# Patient Record
Sex: Male | Born: 1945 | Race: White | Hispanic: No | Marital: Married | Smoking: Former smoker
Health system: Southern US, Community
[De-identification: ages and names within clinical notes are randomized; demographics above are authoritative.]

## PROBLEM LIST (undated history)

## (undated) DIAGNOSIS — I10 Essential (primary) hypertension: Secondary | ICD-10-CM

## (undated) DIAGNOSIS — N400 Enlarged prostate without lower urinary tract symptoms: Secondary | ICD-10-CM

## (undated) DIAGNOSIS — E785 Hyperlipidemia, unspecified: Secondary | ICD-10-CM

## (undated) DIAGNOSIS — E7800 Pure hypercholesterolemia, unspecified: Secondary | ICD-10-CM

## (undated) DIAGNOSIS — K219 Gastro-esophageal reflux disease without esophagitis: Secondary | ICD-10-CM

## (undated) DIAGNOSIS — F419 Anxiety disorder, unspecified: Secondary | ICD-10-CM

## (undated) DIAGNOSIS — H269 Unspecified cataract: Secondary | ICD-10-CM

## (undated) DIAGNOSIS — I2510 Atherosclerotic heart disease of native coronary artery without angina pectoris: Secondary | ICD-10-CM

## (undated) HISTORY — PX: CATARACT EXTRACTION, BILATERAL: SHX1313

## (undated) HISTORY — DX: Gastro-esophageal reflux disease without esophagitis: K21.9

## (undated) HISTORY — DX: Pure hypercholesterolemia, unspecified: E78.00

## (undated) HISTORY — DX: Benign prostatic hyperplasia without lower urinary tract symptoms: N40.0

## (undated) HISTORY — PX: COLONOSCOPY: SHX174

## (undated) HISTORY — DX: Atherosclerotic heart disease of native coronary artery without angina pectoris: I25.10

## (undated) HISTORY — PX: TONSILLECTOMY: SUR1361

## (undated) HISTORY — DX: Unspecified cataract: H26.9

## (undated) HISTORY — DX: Anxiety disorder, unspecified: F41.9

## (undated) HISTORY — DX: Hyperlipidemia, unspecified: E78.5

## (undated) HISTORY — PX: APPENDECTOMY: SHX54

## (undated) HISTORY — DX: Essential (primary) hypertension: I10

---

## 2010-09-26 ENCOUNTER — Ambulatory Visit: Payer: Self-pay | Admitting: Surgery

## 2010-09-30 ENCOUNTER — Ambulatory Visit: Payer: Self-pay | Admitting: Surgery

## 2011-10-06 ENCOUNTER — Emergency Department: Payer: Self-pay | Admitting: *Deleted

## 2011-10-06 LAB — URINALYSIS, COMPLETE
Bacteria: NONE SEEN
Bilirubin,UR: NEGATIVE
Blood: NEGATIVE
Glucose,UR: NEGATIVE mg/dL (ref 0–75)
Nitrite: NEGATIVE
WBC UR: 9 /HPF (ref 0–5)

## 2011-10-06 LAB — CBC
HCT: 49.6 % (ref 40.0–52.0)
MCH: 30.8 pg (ref 26.0–34.0)
MCHC: 32.8 g/dL (ref 32.0–36.0)
MCV: 94 fL (ref 80–100)
RBC: 5.28 10*6/uL (ref 4.40–5.90)
RDW: 14.1 % (ref 11.5–14.5)
WBC: 12.8 10*3/uL — ABNORMAL HIGH (ref 3.8–10.6)

## 2011-10-06 LAB — BASIC METABOLIC PANEL
Anion Gap: 9 (ref 7–16)
BUN: 18 mg/dL (ref 7–18)
Co2: 24 mmol/L (ref 21–32)
EGFR (African American): >60
Potassium: 4.6 mmol/L (ref 3.5–5.1)

## 2011-10-27 ENCOUNTER — Ambulatory Visit: Payer: Self-pay | Admitting: Urology

## 2011-11-10 ENCOUNTER — Ambulatory Visit: Payer: Self-pay | Admitting: Urology

## 2012-04-26 ENCOUNTER — Emergency Department: Payer: Self-pay | Admitting: Emergency Medicine

## 2012-04-26 LAB — URINALYSIS, COMPLETE
Ph: 5 (ref 4.5–8.0)
Protein: 30
RBC,UR: 203 /HPF (ref 0–5)
Squamous Epithelial: <1
WBC UR: 16 /HPF (ref 0–5)

## 2012-04-26 LAB — BASIC METABOLIC PANEL
Calcium, Total: 8.4 mg/dL — ABNORMAL LOW (ref 8.5–10.1)
Chloride: 108 mmol/L — ABNORMAL HIGH (ref 98–107)
Creatinine: 1.18 mg/dL (ref 0.60–1.30)
EGFR (Non-African Amer.): >60
Glucose: 107 mg/dL — ABNORMAL HIGH (ref 65–99)
Potassium: 4.5 mmol/L (ref 3.5–5.1)
Sodium: 139 mmol/L (ref 136–145)

## 2012-04-26 LAB — CBC
HCT: 45.1 % (ref 40.0–52.0)
MCHC: 35.6 g/dL (ref 32.0–36.0)
MCV: 93 fL (ref 80–100)
RDW: 14 % (ref 11.5–14.5)

## 2013-09-18 DIAGNOSIS — N400 Enlarged prostate without lower urinary tract symptoms: Secondary | ICD-10-CM | POA: Insufficient documentation

## 2013-09-18 DIAGNOSIS — K219 Gastro-esophageal reflux disease without esophagitis: Secondary | ICD-10-CM | POA: Insufficient documentation

## 2013-09-18 DIAGNOSIS — R251 Tremor, unspecified: Secondary | ICD-10-CM | POA: Insufficient documentation

## 2013-09-18 DIAGNOSIS — E785 Hyperlipidemia, unspecified: Secondary | ICD-10-CM | POA: Insufficient documentation

## 2017-05-16 ENCOUNTER — Other Ambulatory Visit: Payer: Self-pay | Admitting: Gastroenterology

## 2017-05-16 DIAGNOSIS — R1031 Right lower quadrant pain: Secondary | ICD-10-CM

## 2017-05-24 ENCOUNTER — Ambulatory Visit
Admission: RE | Admit: 2017-05-24 | Discharge: 2017-05-24 | Disposition: A | Discharge status: Home / Self Care | Payer: Medicare PPO | Source: Ambulatory Visit | Attending: Gastroenterology | Admitting: Gastroenterology

## 2017-05-24 ENCOUNTER — Ambulatory Visit: Admission: RE | Admit: 2017-05-24 | Payer: Self-pay | Source: Ambulatory Visit

## 2017-05-24 DIAGNOSIS — R918 Other nonspecific abnormal finding of lung field: Secondary | ICD-10-CM | POA: Insufficient documentation

## 2017-05-24 DIAGNOSIS — K4090 Unilateral inguinal hernia, without obstruction or gangrene, not specified as recurrent: Secondary | ICD-10-CM | POA: Diagnosis not present

## 2017-05-24 DIAGNOSIS — R1031 Right lower quadrant pain: Secondary | ICD-10-CM | POA: Diagnosis present

## 2017-05-24 DIAGNOSIS — K5730 Diverticulosis of large intestine without perforation or abscess without bleeding: Secondary | ICD-10-CM | POA: Diagnosis not present

## 2017-05-24 DIAGNOSIS — R911 Solitary pulmonary nodule: Secondary | ICD-10-CM | POA: Insufficient documentation

## 2017-05-24 MED ORDER — IOPAMIDOL (ISOVUE-300) INJECTION 61%
100.0000 mL | Freq: Once | INTRAVENOUS | Status: AC | PRN
  Administered 2017-05-24: 100 mL via INTRAVENOUS

## 2017-07-13 ENCOUNTER — Ambulatory Visit: Admission: RE | Admit: 2017-07-13 | Payer: Medicare PPO | Source: Ambulatory Visit | Admitting: Gastroenterology

## 2017-07-13 ENCOUNTER — Encounter: Admission: RE | Payer: Self-pay | Source: Ambulatory Visit

## 2017-07-13 SURGERY — ESOPHAGOGASTRODUODENOSCOPY (EGD) WITH PROPOFOL
Anesthesia: General

## 2017-10-01 ENCOUNTER — Other Ambulatory Visit: Payer: Self-pay | Admitting: Family Medicine

## 2017-10-01 DIAGNOSIS — R911 Solitary pulmonary nodule: Secondary | ICD-10-CM

## 2017-10-05 ENCOUNTER — Ambulatory Visit: Payer: Medicare PPO

## 2017-10-10 ENCOUNTER — Ambulatory Visit
Admission: RE | Admit: 2017-10-10 | Discharge: 2017-10-10 | Disposition: A | Discharge status: Home / Self Care | Payer: Medicare PPO | Source: Ambulatory Visit | Attending: Family Medicine | Admitting: Family Medicine

## 2017-10-10 ENCOUNTER — Ambulatory Visit: Payer: Medicare PPO

## 2017-10-10 DIAGNOSIS — I700 Atherosclerosis of aorta: Secondary | ICD-10-CM | POA: Diagnosis not present

## 2017-10-10 DIAGNOSIS — R911 Solitary pulmonary nodule: Secondary | ICD-10-CM | POA: Insufficient documentation

## 2017-10-10 DIAGNOSIS — I2510 Atherosclerotic heart disease of native coronary artery without angina pectoris: Secondary | ICD-10-CM | POA: Insufficient documentation

## 2017-10-10 DIAGNOSIS — Q859 Phakomatosis, unspecified: Secondary | ICD-10-CM | POA: Diagnosis not present

## 2017-10-10 DIAGNOSIS — R918 Other nonspecific abnormal finding of lung field: Secondary | ICD-10-CM | POA: Diagnosis not present

## 2017-11-05 DIAGNOSIS — N183 Chronic kidney disease, stage 3 unspecified: Secondary | ICD-10-CM | POA: Insufficient documentation

## 2017-11-09 ENCOUNTER — Other Ambulatory Visit: Payer: Self-pay | Admitting: Nephrology

## 2017-11-09 DIAGNOSIS — N183 Chronic kidney disease, stage 3 unspecified: Secondary | ICD-10-CM

## 2017-11-09 DIAGNOSIS — N179 Acute kidney failure, unspecified: Secondary | ICD-10-CM

## 2017-11-20 ENCOUNTER — Ambulatory Visit
Admission: RE | Admit: 2017-11-20 | Discharge: 2017-11-20 | Disposition: A | Discharge status: Home / Self Care | Payer: Medicare PPO | Source: Ambulatory Visit | Attending: Nephrology | Admitting: Nephrology

## 2017-11-20 DIAGNOSIS — N179 Acute kidney failure, unspecified: Secondary | ICD-10-CM | POA: Insufficient documentation

## 2017-11-20 DIAGNOSIS — N183 Chronic kidney disease, stage 3 unspecified: Secondary | ICD-10-CM

## 2017-12-20 DIAGNOSIS — R768 Other specified abnormal immunological findings in serum: Secondary | ICD-10-CM | POA: Insufficient documentation

## 2018-01-07 ENCOUNTER — Inpatient Hospital Stay: Payer: Medicare PPO

## 2018-01-07 ENCOUNTER — Ambulatory Visit
Admission: RE | Admit: 2018-01-07 | Discharge: 2018-01-07 | Disposition: A | Discharge status: Home / Self Care | Payer: Medicare PPO | Source: Ambulatory Visit | Attending: Oncology | Admitting: Oncology

## 2018-01-07 ENCOUNTER — Encounter: Payer: Self-pay | Admitting: Oncology

## 2018-01-07 ENCOUNTER — Inpatient Hospital Stay: Payer: Medicare PPO | Attending: Oncology | Admitting: Oncology

## 2018-01-07 VITALS — BP 114/61 | HR 68 | Temp 98.0°F | Resp 18 | Wt 212.2 lb

## 2018-01-07 DIAGNOSIS — R5383 Other fatigue: Secondary | ICD-10-CM | POA: Insufficient documentation

## 2018-01-07 DIAGNOSIS — E785 Hyperlipidemia, unspecified: Secondary | ICD-10-CM

## 2018-01-07 DIAGNOSIS — I129 Hypertensive chronic kidney disease with stage 1 through stage 4 chronic kidney disease, or unspecified chronic kidney disease: Secondary | ICD-10-CM | POA: Insufficient documentation

## 2018-01-07 DIAGNOSIS — F419 Anxiety disorder, unspecified: Secondary | ICD-10-CM | POA: Insufficient documentation

## 2018-01-07 DIAGNOSIS — N400 Enlarged prostate without lower urinary tract symptoms: Secondary | ICD-10-CM | POA: Insufficient documentation

## 2018-01-07 DIAGNOSIS — Z79899 Other long term (current) drug therapy: Secondary | ICD-10-CM | POA: Diagnosis not present

## 2018-01-07 DIAGNOSIS — R778 Other specified abnormalities of plasma proteins: Secondary | ICD-10-CM | POA: Insufficient documentation

## 2018-01-07 DIAGNOSIS — N183 Chronic kidney disease, stage 3 (moderate): Secondary | ICD-10-CM | POA: Diagnosis not present

## 2018-01-07 DIAGNOSIS — K219 Gastro-esophageal reflux disease without esophagitis: Secondary | ICD-10-CM | POA: Diagnosis not present

## 2018-01-07 DIAGNOSIS — R0609 Other forms of dyspnea: Secondary | ICD-10-CM

## 2018-01-07 LAB — CBC WITH DIFFERENTIAL/PLATELET
Basophils Absolute: 0 10*3/uL (ref 0–0.1)
Basophils Relative: 0 %
Eosinophils Absolute: 0.2 10*3/uL (ref 0–0.7)
Eosinophils Relative: 2 %
HEMATOCRIT: 42.4 % (ref 40.0–52.0)
HEMOGLOBIN: 14.5 g/dL (ref 13.0–18.0)
LYMPHS ABS: 1.2 10*3/uL (ref 1.0–3.6)
Lymphocytes Relative: 18 %
MCH: 31.9 pg (ref 26.0–34.0)
MCHC: 34.1 g/dL (ref 32.0–36.0)
MCV: 93.6 fL (ref 80.0–100.0)
MONO ABS: 0.7 10*3/uL (ref 0.2–1.0)
MONOS PCT: 10 %
NEUTROS ABS: 4.7 10*3/uL (ref 1.4–6.5)
NEUTROS PCT: 70 %
Platelets: 196 10*3/uL (ref 150–440)
RBC: 4.53 MIL/uL (ref 4.40–5.90)
RDW: 13 % (ref 11.5–14.5)
WBC: 6.8 10*3/uL (ref 3.8–10.6)

## 2018-01-07 LAB — COMPREHENSIVE METABOLIC PANEL
ALBUMIN: 4.1 g/dL (ref 3.5–5.0)
ALK PHOS: 67 U/L (ref 38–126)
ALT: 21 U/L (ref 0–44)
ANION GAP: 9 (ref 5–15)
AST: 21 U/L (ref 15–41)
BUN: 25 mg/dL — ABNORMAL HIGH (ref 8–23)
CALCIUM: 8.9 mg/dL (ref 8.9–10.3)
CHLORIDE: 101 mmol/L (ref 98–111)
CO2: 25 mmol/L (ref 22–32)
Creatinine, Ser: 1.56 mg/dL — ABNORMAL HIGH (ref 0.61–1.24)
GFR calc Af Amer: 49 mL/min — ABNORMAL LOW (ref >60)
GFR calc non Af Amer: 42 mL/min — ABNORMAL LOW (ref >60)
GLUCOSE: 93 mg/dL (ref 70–99)
Potassium: 4.5 mmol/L (ref 3.5–5.1)
Sodium: 135 mmol/L (ref 135–145)
Total Bilirubin: 0.6 mg/dL (ref 0.3–1.2)
Total Protein: 8 g/dL (ref 6.5–8.1)

## 2018-01-07 LAB — IRON AND TIBC
Iron: 63 ug/dL (ref 45–182)
Saturation Ratios: 23 % (ref 17.9–39.5)
TIBC: 274 ug/dL (ref 250–450)
UIBC: 211 ug/dL

## 2018-01-07 LAB — FOLATE: FOLATE: 28 ng/mL (ref >5.9)

## 2018-01-07 LAB — FERRITIN: Ferritin: 159 ng/mL (ref 24–336)

## 2018-01-07 NOTE — Progress Notes (Signed)
Hematology/Oncology Consult note [facility] Telephone:([phone] Fax:([phone]  Patient Care Team: Sofie Hartigan, MD as PCP - General (Family Medicine)   Name of the patient: Steve Warren  [id number]  10/04/1945    Reason for referral- abnormal spep   Referring physician- Dr. Holley Raring  Date of visit: 01/07/18   History of presenting illness-patient is 72 year old male who was recently seen by rheumatology for positive ANA.  He has a history of stage III chronic kidney disease, hypertension, hyperlipidemia and GERD.  He has been referred to [place] for abnormal SPEP.  His most recent labs from 11/07/2017 showed white count of 8.3, H&H of 12.8/38 with an MCV of 93.4 and a platelet count of 224.  LFTs were within normal limits.  Serum calcium was 9.3.  Serum creatinine was elevated at 1.58.  As a part of work-up for chronic kidney disease patient had an SPEP done which showed abnormal protein in the gammaglobulin and a possible second abnormal protein which may represent monoclonal immunoglobulins.  Recent CBC from 12/20/2017 showed H&H of 14.5/44.5.  Patient reports mild fatigue and feels short of breath when he climbs 1 flight of stairs.  His appetite is good and denies any unintentional weight loss.  Denies any new aches or pains anywhere.  No recurrent infections.  ECOG PS- 0  Pain scale- 0   Review of systems- Review of Systems  Constitutional: Negative for chills, fever, malaise/fatigue and weight loss.  HENT: Negative for congestion, ear discharge and nosebleeds.   Eyes: Negative for blurred vision.  Respiratory: Negative for cough, hemoptysis, sputum production, shortness of breath and wheezing.   Cardiovascular: Negative for chest pain, palpitations, orthopnea and claudication.  Gastrointestinal: Negative for abdominal pain, blood in stool, constipation, diarrhea, heartburn, melena, nausea and vomiting.  Genitourinary: Negative for dysuria, flank  pain, frequency, hematuria and urgency.  Musculoskeletal: Negative for back pain, joint pain and myalgias.  Skin: Negative for rash.  Neurological: Negative for dizziness, tingling, focal weakness, seizures, weakness and headaches.  Endo/Heme/Allergies: Does not bruise/bleed easily.  Psychiatric/Behavioral: Negative for depression and suicidal ideas. The patient does not have insomnia.     Not on File  There are no active problems to display for this patient.    Past Medical History:  Diagnosis Date  . Anxiety   . BPH (benign prostatic hyperplasia)   . GERD (gastroesophageal reflux disease)   . Hypercholesterolemia   . Hyperlipidemia   . Hypertension      Past Surgical History:  Procedure Laterality Date  . APPENDECTOMY    . COLONOSCOPY    . TONSILLECTOMY      Social History   Socioeconomic History  . Marital status: Married    Spouse name: Not on file  . Number of children: Not on file  . Years of education: Not on file  . Highest education level: Not on file  Occupational History  . Not on file  Social Needs  . Financial resource strain: Not on file  . Food insecurity:    Worry: Not on file    Inability: Not on file  . Transportation needs:    Medical: Not on file    Non-medical: Not on file  Tobacco Use  . Smoking status: Never Smoker  . Smokeless tobacco: Never Used  Substance and Sexual Activity  . Alcohol use: Not on file  . Drug use: Not on file  . Sexual activity: Not on file  Lifestyle  . Physical activity:  Days per week: Not on file    Minutes per session: Not on file  . Stress: Not on file  Relationships  . Social connections:    Talks on phone: Not on file    Gets together: Not on file    Attends religious service: Not on file    Active member of club or organization: Not on file    Attends meetings of clubs or organizations: Not on file    Relationship status: Not on file  . Intimate partner violence:    Fear of current or ex  partner: Not on file    Emotionally abused: Not on file    Physically abused: Not on file    Forced sexual activity: Not on file  Other Topics Concern  . Not on file  Social History Narrative  . Not on file     Family History  Problem Relation Age of Onset  . Alzheimer's disease Father   . Alzheimer's disease Maternal Grandmother      Current Outpatient Medications:  .  cholecalciferol (VITAMIN D) 1000 units tablet, Take 1,000 Units by mouth daily., Disp: , Rfl:  .  omeprazole (PRILOSEC) 20 MG capsule, Take 20 mg by mouth 2 (two) times daily before a meal., Disp: , Rfl:  .  primidone (MYSOLINE) 50 MG tablet, TAKE 1 TABLET(50 MG) BY MOUTH EVERY NIGHT, Disp: , Rfl:  .  simvastatin (ZOCOR) 20 MG tablet, TAKE 1 TABLET(20 MG) BY MOUTH EVERY NIGHT, Disp: , Rfl:  .  tamsulosin (FLOMAX) 0.4 MG CAPS capsule, TAKE 1 CAPSULE BY MOUTH DAILY. TAKE 30 MINUTES AFTER SAME MEAL EACH DAY, Disp: , Rfl:  .  vitamin C (ASCORBIC ACID) 500 MG tablet, Take 500 mg by mouth daily., Disp: , Rfl:    Physical exam:  Vitals:   01/07/18 1348  BP: 114/61  Pulse: 68  Resp: 18  Temp: 98 F (36.7 C)  TempSrc: Tympanic  SpO2: 100%  Weight: 212 lb 3.1 oz (96.2 kg)   Physical Exam  Constitutional: He is oriented to person, place, and time.  HENT:  Head: Normocephalic and atraumatic.  Eyes: Pupils are equal, round, and reactive to light. EOM are normal.  Neck: Normal range of motion.  Cardiovascular: Normal rate, regular rhythm and normal heart sounds.  Pulmonary/Chest: Effort normal and breath sounds normal.  Abdominal: Soft. Bowel sounds are normal.  Neurological: He is alert and oriented to person, place, and time.  Skin: Skin is warm and dry.       CMP Latest Ref Rng & Units 01/07/2018  Glucose 70 - 99 mg/dL 93  BUN 8 - 23 mg/dL 25(H)  Creatinine 0.61 - 1.24 mg/dL 1.56(H)  Sodium 135 - 145 mmol/L 135  Potassium 3.5 - 5.1 mmol/L 4.5  Chloride 98 - 111 mmol/L 101  CO2 22 - 32 mmol/L 25    Calcium 8.9 - 10.3 mg/dL 8.9  Total Protein 6.5 - 8.1 g/dL 8.0  Total Bilirubin 0.3 - 1.2 mg/dL 0.6  Alkaline Phos 38 - 126 U/L 67  AST 15 - 41 U/L 21  ALT 0 - 44 U/L 21   CBC Latest Ref Rng & Units 01/07/2018  WBC 3.8 - 10.6 K/uL 6.8  Hemoglobin 13.0 - 18.0 g/dL 14.5  Hematocrit 40.0 - 52.0 % 42.4  Platelets 150 - 440 K/uL 196     Assessment and plan- Patient is a 72 y.o. male referred for abnormal SPEP  I have reviewed outside records from Dr. Elwyn Lade office and  his most recent creatinine was down to 1.8 from 2.2 in May 2019.  He does not have any evidence of anemia, hypercalcemia.  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  I will see the patient back in 3 weeks time to discuss the results of the tests and further management.  I explained to the patient the natural history of MGUS and differences between MGUS, smoldering multiple myeloma and overt multiple myeloma.  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 Abnormal SPEP     Dr. Randa Evens, MD, MPH [facility] at [facility] [phone] 01/07/2018  3:51 PM

## 2018-01-08 ENCOUNTER — Telehealth: Payer: Self-pay

## 2018-01-08 LAB — MULTIPLE MYELOMA PANEL, SERUM
Albumin SerPl Elph-Mcnc: 3.7 g/dL (ref 2.9–4.4)
Albumin/Glob SerPl: 1.1 (ref 0.7–1.7)
Alpha 1: 0.3 g/dL (ref 0.0–0.4)
Alpha2 Glob SerPl Elph-Mcnc: 0.9 g/dL (ref 0.4–1.0)
B-GLOBULIN SERPL ELPH-MCNC: 0.9 g/dL (ref 0.7–1.3)
Gamma Glob SerPl Elph-Mcnc: 1.4 g/dL (ref 0.4–1.8)
Globulin, Total: 3.4 g/dL (ref 2.2–3.9)
IGA: 106 mg/dL (ref 61–437)
IgG (Immunoglobin G), Serum: 1634 mg/dL — ABNORMAL HIGH (ref 700–1600)
IgM (Immunoglobulin M), Srm: 34 mg/dL (ref 15–143)
M PROTEIN SERPL ELPH-MCNC: 0.6 g/dL — AB
TOTAL PROTEIN ELP: 7.1 g/dL (ref 6.0–8.5)

## 2018-01-08 LAB — KAPPA/LAMBDA LIGHT CHAINS
Kappa free light chain: 92.2 mg/L — ABNORMAL HIGH (ref 3.3–19.4)
Kappa, lambda light chain ratio: 5.95 — ABNORMAL HIGH (ref 0.26–1.65)
LAMDA FREE LIGHT CHAINS: 15.5 mg/L (ref 5.7–26.3)

## 2018-01-08 LAB — VITAMIN B12: VITAMIN B 12: 224 pg/mL (ref 180–914)

## 2018-01-08 NOTE — Telephone Encounter (Signed)
-----   Message from Sindy Guadeloupe, MD sent at 01/08/2018  7:55 AM EDT ----- Please let him know b12 levels are low. He should start taking oral b12 1000 mcg po daily. Thanks, Astrid Divine

## 2018-01-08 NOTE — Telephone Encounter (Signed)
Spoke with the patient to inform him that his B-12 levels are running low. I have inform the patient that he will need to start a B-12 oral 1,000 mcg daily. The patient was understanding and agreeable to start to the B-12 as of today.

## 2018-01-09 ENCOUNTER — Other Ambulatory Visit: Admission: RE | Admit: 2018-01-09 | Payer: Medicare PPO | Source: Ambulatory Visit

## 2018-01-09 ENCOUNTER — Inpatient Hospital Stay: Payer: Medicare PPO

## 2018-01-09 DIAGNOSIS — R778 Other specified abnormalities of plasma proteins: Secondary | ICD-10-CM | POA: Diagnosis not present

## 2018-01-10 LAB — UPEP/TP, 24-HR URINE
ALBUMIN, U: 100 %
ALPHA 1 UR: 0 %
ALPHA 2, URINE: 0 %
BETA UR: 0 %
GAMMA UR: 0 %
TOTAL PROTEIN, URINE-UPE24: 4.3 mg/dL
Total Protein, Urine-Ur/day: 114 mg/24 hr (ref 30–150)
Total Volume: 2650

## 2018-01-24 ENCOUNTER — Encounter: Payer: Self-pay | Admitting: Nephrology

## 2018-01-28 ENCOUNTER — Telehealth: Payer: Self-pay | Admitting: *Deleted

## 2018-01-28 ENCOUNTER — Telehealth: Payer: Self-pay

## 2018-01-28 ENCOUNTER — Encounter: Payer: Self-pay | Admitting: Oncology

## 2018-01-28 ENCOUNTER — Inpatient Hospital Stay: Payer: Medicare PPO | Attending: Oncology | Admitting: Oncology

## 2018-01-28 VITALS — BP 95/52 | HR 80 | Temp 97.9°F | Resp 18 | Ht 71.0 in | Wt 214.9 lb

## 2018-01-28 DIAGNOSIS — D8989 Other specified disorders involving the immune mechanism, not elsewhere classified: Secondary | ICD-10-CM | POA: Insufficient documentation

## 2018-01-28 DIAGNOSIS — R778 Other specified abnormalities of plasma proteins: Secondary | ICD-10-CM | POA: Diagnosis not present

## 2018-01-28 NOTE — Progress Notes (Signed)
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  He has a history of stage III chronic kidney disease, hypertension, hyperlipidemia and GERD.  He has been referred to [place] for abnormal SPEP.  His most recent labs from 11/07/2017 showed white count of 8.3, H&H of 12.8/38 with an MCV of 93.4 and a platelet count of 224.  LFTs were within normal limits.  Serum calcium was 9.3.  Serum creatinine was elevated at 1.58.  As a part of work-up for chronic kidney disease patient had an SPEP done which showed abnormal protein in the gammaglobulin and a possible second abnormal protein which may represent monoclonal immunoglobulins.  Recent CBC from 12/20/2017 showed H&H of 14.5/44.5.   Results of blood work from 01/07/2018 were as follows: CBC showed white count of 6.8, H&H of 14.5/42.4 and a platelet count of 196.  CMP was significant for elevated creatinine of 1.56.  Multiple myeloma panel showed 0.6 g of IgG kappa light chain monoclonal protein.  Free kappa light chain was elevated at 92.2 and kappa lambda light chain ratio was abnormal at 5.95.  Iron studies and folate were normal.  B12 levels were low at 224  Interval history-patient feels well presently and denies any complaints today  ECOG PS- 1 Pain scale- 0   Review of systems- Review of Systems  Constitutional: Negative for chills, fever, malaise/fatigue and weight loss.  HENT: Negative for congestion, ear discharge and nosebleeds.   Eyes: Negative for blurred vision.  Respiratory: Negative for cough,  hemoptysis, sputum production, shortness of breath and wheezing.   Cardiovascular: Negative for chest pain, palpitations, orthopnea and claudication.  Gastrointestinal: Negative for abdominal pain, blood in stool, constipation, diarrhea, heartburn, melena, nausea and vomiting.  Genitourinary: Negative for dysuria, flank pain, frequency, hematuria and urgency.  Musculoskeletal: Negative for back pain, joint pain and myalgias.  Skin: Negative for rash.  Neurological: Negative for dizziness, tingling, focal weakness, seizures, weakness and headaches.  Endo/Heme/Allergies: Does not bruise/bleed easily.  Psychiatric/Behavioral: Negative for depression and suicidal ideas. The patient does not have insomnia.       Not on File   Past Medical History:  Diagnosis Date  . Anxiety   . BPH (benign prostatic hyperplasia)   . GERD (gastroesophageal reflux disease)   . Hypercholesterolemia   . Hyperlipidemia   . Hypertension      Past Surgical History:  Procedure Laterality Date  . APPENDECTOMY    . COLONOSCOPY    . TONSILLECTOMY      Social History   Socioeconomic History  . Marital status: Married    Spouse name: Not on file  . Number of children: Not on file  . Years of education: Not on file  . Highest education level: Not on file  Occupational History  . Not on file  Social Needs  . Financial resource strain: Not on file  . Food insecurity:    Worry: Not on file    Inability: Not on file  . Transportation needs:    Medical: Not on file    Non-medical: Not on file  Tobacco Use  .  Smoking status: Never Smoker  . Smokeless tobacco: Never Used  Substance and Sexual Activity  . Alcohol use: Not on file  . Drug use: Not on file  . Sexual activity: Not on file  Lifestyle  . Physical activity:    Days per week: Not on file    Minutes per session: Not on file  . Stress: Not on file  Relationships  . Social connections:    Talks on phone: Not on file    Gets together: Not  on file    Attends religious service: Not on file    Active member of club or organization: Not on file    Attends meetings of clubs or organizations: Not on file    Relationship status: Not on file  . Intimate partner violence:    Fear of current or ex partner: Not on file    Emotionally abused: Not on file    Physically abused: Not on file    Forced sexual activity: Not on file  Other Topics Concern  . Not on file  Social History Narrative  . Not on file    Family History  Problem Relation Age of Onset  . Alzheimer's disease Father   . Alzheimer's disease Maternal Grandmother      Current Outpatient Medications:  .  cholecalciferol (VITAMIN D) 1000 units tablet, Take 1,000 Units by mouth daily., Disp: , Rfl:  .  Cyanocobalamin (VITAMIN B 12 PO), Take 1,000 mcg by mouth daily., Disp: , Rfl:  .  primidone (MYSOLINE) 50 MG tablet, TAKE 1 TABLET(50 MG) BY MOUTH EVERY NIGHT, Disp: , Rfl:  .  simvastatin (ZOCOR) 20 MG tablet, TAKE 1 TABLET(20 MG) BY MOUTH EVERY NIGHT, Disp: , Rfl:  .  tamsulosin (FLOMAX) 0.4 MG CAPS capsule, TAKE 1 CAPSULE BY MOUTH DAILY. TAKE 30 MINUTES AFTER SAME MEAL EACH DAY, Disp: , Rfl:  .  omeprazole (PRILOSEC) 20 MG capsule, Take 20 mg by mouth 2 (two) times daily before a meal., Disp: , Rfl:  .  vitamin C (ASCORBIC ACID) 500 MG tablet, Take 500 mg by mouth daily., Disp: , Rfl:   Physical exam:  Vitals:   01/28/18 0939  BP: (!) 95/52  Pulse: 80  Resp: 18  Temp: 97.9 F (36.6 C)  TempSrc: Tympanic  SpO2: 100%  Weight: 214 lb 15.2 oz (97.5 kg)  Height: 5' 11" (1.803 m)   Physical Exam  Constitutional: He is oriented to person, place, and time. He appears well-developed and well-nourished.  HENT:  Head: Normocephalic and atraumatic.  Eyes: Pupils are equal, round, and reactive to light. EOM are normal.  Neck: Normal range of motion.  Cardiovascular: Normal rate, regular rhythm and normal heart sounds.  Pulmonary/Chest: Effort normal and breath  sounds normal.  Abdominal: Soft. Bowel sounds are normal.  Neurological: He is alert and oriented to person, place, and time.  Skin: Skin is warm and dry.     CMP Latest Ref Rng & Units 01/07/2018  Glucose 70 - 99 mg/dL 93  BUN 8 - 23 mg/dL 25(H)  Creatinine 0.61 - 1.24 mg/dL 1.56(H)  Sodium 135 - 145 mmol/L 135  Potassium 3.5 - 5.1 mmol/L 4.5  Chloride 98 - 111 mmol/L 101  CO2 22 - 32 mmol/L 25  Calcium 8.9 - 10.3 mg/dL 8.9  Total Protein 6.5 - 8.1 g/dL 8.0  Total Bilirubin 0.3 - 1.2 mg/dL 0.6  Alkaline Phos 38 - 126 U/L 67  AST 15 - 41 U/L 21  ALT 0 -   44 U/L 21   CBC Latest Ref Rng & Units 01/07/2018  WBC 3.8 - 10.6 K/uL 6.8  Hemoglobin 13.0 - 18.0 g/dL 14.5  Hematocrit 40.0 - 52.0 % 42.4  Platelets 150 - 440 K/uL 196    No images are attached to the encounter.  Dg Bone Survey Met  Result Date: 01/08/2018 CLINICAL DATA:  72-year-old male with a history of abnormal serum protein electrophoresis EXAM: METASTATIC BONE SURVEY COMPARISON:  None. FINDINGS: Skull: No lucent bone lesions. Cervical spine: Degenerative disc disease without malalignment.  No acute fracture. No lucent lesions. Chest: Cardiomediastinal silhouette within normal limits.  Lungs are clear. No acute displaced fracture or lucent lesions identified. Right upper extremity: No acute displaced fracture.  No lucent lesions. Left upper extremity: No acute displaced fracture.  No lucent lesions. Thoracic spine: Mild degenerative disc disease with endplate changes and anterior osteophyte production, most pronounced in the mid and lower levels. No lucent lesions. Lumbar spine: Degenerative disc disease worst at the L3-S1 levels, including advanced endplate changes and disc space narrowing at L4-L5 and L5-S1. Facet hypertrophy at L4-L5 and L5-S1. No lucent lesions are acute displaced fractures. Pelvis: Bony pelvic ring intact. Bilateral hips projects normally over the acetabula. No lucent lesions are displaced fracture. Pelvic  phleboliths. Right lower extremity: No acute displaced fracture or lucent lesions identified. Left lower extremity: No acute displaced fracture or lucent lesions identified. IMPRESSION: No evidence of metastatic disease on the bone survey. Electronically Signed   By: Jaime  Wagner D.O.   On: 01/08/2018 08:28     Assessment and plan- Patient is a 72 y.o. male referred for abnormal SPEP likely MGUS  I discussed the results of the blood work with the patient and his wife in detail.  Myeloma panel shows a small M protein of 0.6 g IgG kappa.  He does have some evidence of kidney disease with elevated creatinine of 1.5.  His hemoglobin is normal and he does not have any hypercalcemia.  No evidence of lytic lesion seen on bone survey.  However his free light chain ratio is significantly abnormal at 5.95 with elevated free kappa light chain of 92.2.  I would therefore like to get a bone marrow biopsy at this time to rule out overt multiple myeloma.  Abnormal free light chain ratio can be seen in chronic kidney disease as a consequence of it if not the etiology for it.  I have explained all this to the patient and he agrees to proceed with a bone marrow biopsy under conscious sedation which we will arrange for the next couple of weeks.  I will see him back in 2 weeks time to discuss the results of his bone marrow biopsy and further management.  I again discussed the natural history of MGUS and different criteria for definition of MGUS, smoldering multiple myeloma and overt multiple myeloma.  B12 levels were borderline low at 224) he patient is taking oral B12 thousand micrograms daily.  We will repeat his levels in 3 months time   Total face to face encounter time for this patient visit was 30 min. >50% of the time was  spent in counseling and coordination of care.     Visit Diagnosis 1. Light chain disease, kappa type (HCC)   2. Abnormal SPEP      Dr. Archana Rao, MD, MPH [facility] at [facility] [phone] 01/28/2018 12:19 PM                

## 2018-01-28 NOTE — Telephone Encounter (Signed)
message left on the voice mail from Marcie Bal to inform Dr Janese Banks office there has been a bx schedule for the patient (02/05/18 at 8:30 AM the patient will need to show up at 7:30 AM.

## 2018-01-28 NOTE — Progress Notes (Signed)
No new changes noted today 

## 2018-01-28 NOTE — Telephone Encounter (Signed)
Called the patient's home and spoke to pt's wife about Bone marrow bx. It is 9/24 be there at 7:30 at medical mall at [facility] and go to the admitting and registration desk.. NPO after midnight the night before procedure. Have a driver to drive pt. Home. The f/u appt for pt is 10/7 at 9:45 in [place]. Wife agreeable to the above

## 2018-02-04 ENCOUNTER — Other Ambulatory Visit: Payer: Self-pay | Admitting: Physician Assistant

## 2018-02-05 ENCOUNTER — Other Ambulatory Visit (HOSPITAL_COMMUNITY)
Admission: RE | Admit: 2018-02-05 | Disposition: A | Discharge status: Home / Self Care | Payer: Medicare PPO | Source: Ambulatory Visit | Attending: Oncology | Admitting: Oncology

## 2018-02-05 ENCOUNTER — Ambulatory Visit
Admission: RE | Admit: 2018-02-05 | Discharge: 2018-02-05 | Disposition: A | Discharge status: Home / Self Care | Payer: Medicare PPO | Source: Ambulatory Visit | Attending: Oncology | Admitting: Oncology

## 2018-02-05 DIAGNOSIS — I10 Essential (primary) hypertension: Secondary | ICD-10-CM | POA: Diagnosis not present

## 2018-02-05 DIAGNOSIS — R778 Other specified abnormalities of plasma proteins: Secondary | ICD-10-CM | POA: Diagnosis not present

## 2018-02-05 DIAGNOSIS — E7800 Pure hypercholesterolemia, unspecified: Secondary | ICD-10-CM | POA: Diagnosis not present

## 2018-02-05 DIAGNOSIS — K219 Gastro-esophageal reflux disease without esophagitis: Secondary | ICD-10-CM | POA: Diagnosis not present

## 2018-02-05 DIAGNOSIS — Z79899 Other long term (current) drug therapy: Secondary | ICD-10-CM | POA: Insufficient documentation

## 2018-02-05 DIAGNOSIS — D8989 Other specified disorders involving the immune mechanism, not elsewhere classified: Secondary | ICD-10-CM | POA: Insufficient documentation

## 2018-02-05 DIAGNOSIS — N289 Disorder of kidney and ureter, unspecified: Secondary | ICD-10-CM | POA: Diagnosis not present

## 2018-02-05 DIAGNOSIS — N400 Enlarged prostate without lower urinary tract symptoms: Secondary | ICD-10-CM | POA: Diagnosis not present

## 2018-02-05 DIAGNOSIS — Z820 Family history of epilepsy and other diseases of the nervous system: Secondary | ICD-10-CM | POA: Diagnosis not present

## 2018-02-05 LAB — CBC WITH DIFFERENTIAL/PLATELET
BASOS ABS: 0 10*3/uL (ref 0–0.1)
BASOS PCT: 1 %
Eosinophils Absolute: 0.2 10*3/uL (ref 0–0.7)
Eosinophils Relative: 4 %
HCT: 42.4 % (ref 40.0–52.0)
Hemoglobin: 14.8 g/dL (ref 13.0–18.0)
Lymphocytes Relative: 22 %
Lymphs Abs: 1.5 10*3/uL (ref 1.0–3.6)
MCH: 32.3 pg (ref 26.0–34.0)
MCHC: 34.8 g/dL (ref 32.0–36.0)
MCV: 92.9 fL (ref 80.0–100.0)
Monocytes Absolute: 0.8 10*3/uL (ref 0.2–1.0)
Monocytes Relative: 11 %
NEUTROS PCT: 62 %
Neutro Abs: 4.2 10*3/uL (ref 1.4–6.5)
Platelets: 176 10*3/uL (ref 150–440)
RBC: 4.57 MIL/uL (ref 4.40–5.90)
RDW: 13.7 % (ref 11.5–14.5)
WBC: 6.8 10*3/uL (ref 3.8–10.6)

## 2018-02-05 LAB — PROTIME-INR
INR: 0.97
PROTHROMBIN TIME: 12.8 s (ref 11.4–15.2)

## 2018-02-05 LAB — APTT: APTT: 35 s (ref 24–36)

## 2018-02-05 MED ORDER — MIDAZOLAM HCL 5 MG/5ML IJ SOLN
INTRAMUSCULAR | Status: AC
  Filled 2018-02-05: qty 5

## 2018-02-05 MED ORDER — MIDAZOLAM HCL 2 MG/2ML IJ SOLN
INTRAMUSCULAR | Status: AC | PRN
  Administered 2018-02-05 (×2): 1 mg via INTRAVENOUS

## 2018-02-05 MED ORDER — SODIUM CHLORIDE 0.9 % IV SOLN
INTRAVENOUS | Status: DC
  Administered 2018-02-05: 08:00:00 via INTRAVENOUS

## 2018-02-05 MED ORDER — LIDOCAINE HCL (PF) 1 % IJ SOLN
INTRAMUSCULAR | Status: AC | PRN
  Administered 2018-02-05: 3 mL

## 2018-02-05 MED ORDER — HEPARIN SOD (PORK) LOCK FLUSH 100 UNIT/ML IV SOLN
INTRAVENOUS | Status: AC
  Filled 2018-02-05: qty 5

## 2018-02-05 MED ORDER — FENTANYL CITRATE (PF) 100 MCG/2ML IJ SOLN
INTRAMUSCULAR | Status: AC | PRN
  Administered 2018-02-05 (×2): 50 ug via INTRAVENOUS

## 2018-02-05 MED ORDER — FENTANYL CITRATE (PF) 100 MCG/2ML IJ SOLN
INTRAMUSCULAR | Status: AC
  Filled 2018-02-05: qty 4

## 2018-02-05 NOTE — Procedures (Signed)
BM Bx and core R iliac bone EBL 0 Comp 0

## 2018-02-05 NOTE — Discharge Instructions (Signed)
Moderate Conscious Sedation, Adult, Care After These instructions provide you with information about caring for yourself after your procedure. Your health care provider may also give you more specific instructions. Your treatment has been planned according to current medical practices, but problems sometimes occur. Call your health care provider if you have any problems or questions after your procedure. What can I expect after the procedure? After your procedure, it is common:  To feel sleepy for several hours.  To feel clumsy and have poor balance for several hours.  To have poor judgment for several hours.  To vomit if you eat too soon.  Follow these instructions at home: For at least 24 hours after the procedure:   Do not: ? Participate in activities where you could fall or become injured. ? Drive. ? Use heavy machinery. ? Drink alcohol. ? Take sleeping pills or medicines that cause drowsiness. ? Make important decisions or sign legal documents. ? Take care of children on your own.  Rest. Eating and drinking  Follow the diet recommended by your health care provider.  If you vomit: ? Drink water, juice, or soup when you can drink without vomiting. ? Make sure you have little or no nausea before eating solid foods. General instructions  Have a responsible adult stay with you until you are awake and alert.  Take over-the-counter and prescription medicines only as told by your health care provider.  If you smoke, do not smoke without supervision.  Keep all follow-up visits as told by your health care provider. This is important. Contact a health care provider if:  You keep feeling nauseous or you keep vomiting.  You feel light-headed.  You develop a rash.  You have a fever. Get help right away if:  You have trouble breathing. This information is not intended to replace advice given to you by your health care provider. Make sure you discuss any questions you have  with your health care provider. Document Released: 02/19/2013 Document Revised: 10/04/2015 Document Reviewed: 08/21/2015 Elsevier Interactive Patient Education  2018 [place]. Bone Marrow Aspiration and Bone Marrow Biopsy, Adult, Care After This sheet gives you information about how to care for yourself after your procedure. Your health care provider may also give you more specific instructions. If you have problems or questions, contact your health care provider. What can I expect after the procedure? After the procedure, it is common to have:  Mild pain and tenderness.  Swelling.  Bruising.  Follow these instructions at home:  Take over-the-counter or prescription medicines only as told by your health care provider.  Do not take baths, swim, or use a hot tub until your health care provider approves. Ask if you can take a shower or have a sponge bath.  Follow instructions from your health care provider about how to take care of the puncture site. Make sure you: ? Wash your hands with soap and water before you change your bandage (dressing). If soap and water are not available, use hand sanitizer. ? Change your dressing as told by your health care provider.  Check your puncture siteevery day for signs of infection. Check for: ? More redness, swelling, or pain. ? More fluid or blood. ? Warmth. ? Pus or a bad smell.  Return to your normal activities as told by your health care provider. Ask your health care provider what activities are safe for you.  Do not drive for 24 hours if you were given a medicine to help you relax (sedative).  Keep all follow-up visits as told by your health care provider. This is important. °Contact a health care provider if: °· You have more redness, swelling, or pain around the puncture site. °· You have more fluid or blood coming from the puncture site. °· Your puncture site feels warm to the touch. °· You have pus or a bad smell coming from the  puncture site. °· You have a fever. °· Your pain is not controlled with medicine. °This information is not intended to replace advice given to you by your health care provider. Make sure you discuss any questions you have with your health care provider. °Document Released: 11/18/2004 Document Revised: 11/19/2015 Document Reviewed: 10/13/2015 °Elsevier Interactive Patient Education © 2018 [employer] ° °

## 2018-02-05 NOTE — H&P (Signed)
Chief Complaint: Patient was seen in consultation today for No chief complaint on file.  at the request of [facility]  Referring Physician(s): Rao,Archana C  Supervising Physician: Marybelle Killings  Patient Status: [facility] - Out-pt  History of Present Illness: Steve Warren is a 72 y.o. male with new onset renal insufficiency who underwent a workup and was found to have an abnormal SPEP. He is referred for BM biopsy. He feels well and denies symptoms.  Past Medical History:  Diagnosis Date  . Anxiety   . BPH (benign prostatic hyperplasia)   . GERD (gastroesophageal reflux disease)   . Hypercholesterolemia   . Hyperlipidemia   . Hypertension     Past Surgical History:  Procedure Laterality Date  . APPENDECTOMY    . COLONOSCOPY    . TONSILLECTOMY      Allergies: Patient has no allergy information on record.  Medications: Prior to Admission medications   Medication Sig Start Date End Date Taking? Authorizing Provider  cholecalciferol (VITAMIN D) 1000 units tablet Take 1,000 Units by mouth daily.   Yes [provider]  Cyanocobalamin (VITAMIN B 12 PO) Take 1,000 mcg by mouth daily.   Yes [provider]  omeprazole (PRILOSEC) 20 MG capsule Take 20 mg by mouth 2 (two) times daily before a meal.   Yes [provider]  primidone (MYSOLINE) 50 MG tablet TAKE 1 TABLET(50 MG) BY MOUTH EVERY NIGHT 10/05/17  Yes [provider]  simvastatin (ZOCOR) 20 MG tablet TAKE 1 TABLET(20 MG) BY MOUTH EVERY NIGHT 11/07/17  Yes [provider]  tamsulosin (FLOMAX) 0.4 MG CAPS capsule TAKE 1 CAPSULE BY MOUTH DAILY. TAKE 30 MINUTES AFTER SAME MEAL EACH DAY 11/07/17  Yes [provider]  vitamin C (ASCORBIC ACID) 500 MG tablet Take 500 mg by mouth daily.   Yes [provider]     Family History  Problem Relation Age of Onset  . Alzheimer's disease Father   . Alzheimer's disease Maternal Grandmother     Social History    Socioeconomic History  . Marital status: Married    Spouse name: Not on file  . Number of children: Not on file  . Years of education: Not on file  . Highest education level: Not on file  Occupational History  . Not on file  Social Needs  . Financial resource strain: Not on file  . Food insecurity:    Worry: Not on file    Inability: Not on file  . Transportation needs:    Medical: Not on file    Non-medical: Not on file  Tobacco Use  . Smoking status: Never Smoker  . Smokeless tobacco: Never Used  Substance and Sexual Activity  . Alcohol use: Not on file  . Drug use: Not on file  . Sexual activity: Not on file  Lifestyle  . Physical activity:    Days per week: Not on file    Minutes per session: Not on file  . Stress: Not on file  Relationships  . Social connections:    Talks on phone: Not on file    Gets together: Not on file    Attends religious service: Not on file    Active member of club or organization: Not on file    Attends meetings of clubs or organizations: Not on file    Relationship status: Not on file  Other Topics Concern  . Not on file  Social History Narrative  . Not on file  Review of Systems: A 12 point ROS discussed and pertinent positives are indicated in the HPI above.  All other systems are negative.  Review of Systems  Vital Signs: BP 128/82   Pulse 92   Temp 97.8 F (36.6 C) (Oral)   Resp 16   Ht 5' 11" (1.803 m)   Wt 104.3 kg   SpO2 99%   BMI 32.08 kg/m   Physical Exam  Imaging: Dg Bone Survey Met  Result Date: 01/08/2018 CLINICAL DATA:  72 year old male with a history of abnormal serum protein electrophoresis EXAM: METASTATIC BONE SURVEY COMPARISON:  None. FINDINGS: Skull: No lucent bone lesions. Cervical spine: Degenerative disc disease without malalignment.  No acute fracture. No lucent lesions. Chest: Cardiomediastinal silhouette within normal limits.  Lungs are clear. No acute displaced fracture or lucent lesions  identified. Right upper extremity: No acute displaced fracture.  No lucent lesions. Left upper extremity: No acute displaced fracture.  No lucent lesions. Thoracic spine: Mild degenerative disc disease with endplate changes and anterior osteophyte production, most pronounced in the mid and lower levels. No lucent lesions. Lumbar spine: Degenerative disc disease worst at the L3-S1 levels, including advanced endplate changes and disc space narrowing at L4-L5 and L5-S1. Facet hypertrophy at L4-L5 and L5-S1. No lucent lesions are acute displaced fractures. Pelvis: Bony pelvic ring intact. Bilateral hips projects normally over the acetabula. No lucent lesions are displaced fracture. Pelvic phleboliths. Right lower extremity: No acute displaced fracture or lucent lesions identified. Left lower extremity: No acute displaced fracture or lucent lesions identified. IMPRESSION: No evidence of metastatic disease on the bone survey. Electronically Signed   By: Corrie Mckusick D.O.   On: 01/08/2018 08:28    Labs:  CBC: Recent Labs    01/07/18 1412 02/05/18 0739  WBC 6.8 6.8  HGB 14.5 14.8  HCT 42.4 42.4  PLT 196 176    COAGS: Recent Labs    02/05/18 0739  INR 0.97  APTT 35    BMP: Recent Labs    01/07/18 1412  NA 135  K 4.5  CL 101  CO2 25  GLUCOSE 93  BUN 25*  CALCIUM 8.9  CREATININE 1.56*  GFRNONAA 42*  GFRAA 49*    LIVER FUNCTION TESTS: Recent Labs    01/07/18 1412  BILITOT 0.6  AST 21  ALT 21  ALKPHOS 67  PROT 8.0  ALBUMIN 4.1    TUMOR MARKERS: No results for input(s): AFPTM, CEA, CA199, CHROMGRNA in the last 8760 hours.  Assessment and Plan:  Abnormal SPEP. BM biopsy to follow in the evaluation of MGUS and myeloma.  Thank you for this interesting consult.  I greatly enjoyed meeting Steve Warren and look forward to participating in their care.  A copy of this report was sent to the requesting provider on this date.  Electronically Signed: HOSS, ART A,  MD 02/05/2018, 8:14 AM   I spent a total of  40 Minutes   in face to face in clinical consultation, greater than 50% of which was counseling/coordinating care for bone marrow biopsy.

## 2018-02-18 ENCOUNTER — Encounter: Payer: Self-pay | Admitting: Oncology

## 2018-02-18 ENCOUNTER — Inpatient Hospital Stay: Payer: Medicare PPO | Attending: Oncology | Admitting: Oncology

## 2018-02-18 VITALS — BP 113/71 | HR 72 | Temp 97.0°F | Resp 18 | Ht 71.0 in | Wt 213.0 lb

## 2018-02-18 DIAGNOSIS — D472 Monoclonal gammopathy: Secondary | ICD-10-CM | POA: Diagnosis present

## 2018-02-18 DIAGNOSIS — K219 Gastro-esophageal reflux disease without esophagitis: Secondary | ICD-10-CM | POA: Diagnosis not present

## 2018-02-18 DIAGNOSIS — N183 Chronic kidney disease, stage 3 (moderate): Secondary | ICD-10-CM | POA: Insufficient documentation

## 2018-02-18 DIAGNOSIS — I10 Essential (primary) hypertension: Secondary | ICD-10-CM | POA: Diagnosis not present

## 2018-02-18 DIAGNOSIS — E785 Hyperlipidemia, unspecified: Secondary | ICD-10-CM | POA: Diagnosis not present

## 2018-02-18 DIAGNOSIS — Z79899 Other long term (current) drug therapy: Secondary | ICD-10-CM | POA: Insufficient documentation

## 2018-02-18 NOTE — Progress Notes (Signed)
Hematology/Oncology Consult note Steve Warren  Telephone:([phone] Fax:([phone]  Patient Care Team: Sofie Hartigan, MD as PCP - General (Family Medicine)   Name of the patient: Steve Warren  [id number]  [birth year]   Date of visit: 02/18/18  Diagnosis-IgG kappa MGUS  Chief complaint/ Reason for visit-discuss results of bone marrow biopsy  Heme/Onc history: patient is 72 year old male who was recently seen by rheumatology for positive ANA. He has a history of stage III chronic kidney disease, hypertension, hyperlipidemia and GERD. He has been referred to [place] for abnormal SPEP. His most recent labs from 11/07/2017 showed white count of 8.3, H&H of 12.8/38 with an MCV of 93.4 and a platelet count of 224. LFTs were within normal limits. Serum calcium was 9.3. Serum creatinine was elevated at 1.58. As a part of work-up for chronic kidney disease patient had an SPEP done which showed abnormal protein in the gammaglobulin and a possible second abnormal protein which may represent monoclonal immunoglobulins. Recent CBC from 12/20/2017 showed H&H of 14.5/44.5.   Results of blood work from 01/07/2018 were as follows: CBC showed white count of 6.8, H&H of 14.5/42.4 and a platelet count of 196.  CMP was significant for elevated creatinine of 1.56.  Multiple myeloma panel showed 0.6 g of IgG kappa light chain monoclonal protein.  Free kappa light chain was elevated at 92.2 and kappa lambda light chain ratio was abnormal at 5.95.  Iron studies and folate were normal.  B12 levels were low at 224  Bone marrow biopsy showed 2% plasma cells with kappa light chain restriction  Interval history-he is doing well and denies any complaints today.  His energy levels are good and his appetite is good.  He denies any unintentional weight loss  ECOG PS- 1 Pain scale- 0 Opioid associated constipation- no  Review of systems- Review of Systems  Constitutional: Positive for  malaise/fatigue. Negative for chills, fever and weight loss.  HENT: Negative for congestion, ear discharge and nosebleeds.   Eyes: Negative for blurred vision.  Respiratory: Negative for cough, hemoptysis, sputum production, shortness of breath and wheezing.   Cardiovascular: Negative for chest pain, palpitations, orthopnea and claudication.  Gastrointestinal: Negative for abdominal pain, blood in stool, constipation, diarrhea, heartburn, melena, nausea and vomiting.  Genitourinary: Negative for dysuria, flank pain, frequency, hematuria and urgency.  Musculoskeletal: Negative for back pain, joint pain and myalgias.  Skin: Negative for rash.  Neurological: Negative for dizziness, tingling, focal weakness, seizures, weakness and headaches.  Endo/Heme/Allergies: Does not bruise/bleed easily.  Psychiatric/Behavioral: Negative for depression and suicidal ideas. The patient does not have insomnia.      Not on File   Past Medical History:  Diagnosis Date  . Anxiety   . BPH (benign prostatic hyperplasia)   . GERD (gastroesophageal reflux disease)   . Hypercholesterolemia   . Hyperlipidemia   . Hypertension      Past Surgical History:  Procedure Laterality Date  . APPENDECTOMY    . COLONOSCOPY    . TONSILLECTOMY      Social History   Socioeconomic History  . Marital status: Married    Spouse name: Not on file  . Number of children: Not on file  . Years of education: Not on file  . Highest education level: Not on file  Occupational History  . Not on file  Social Needs  . Financial resource strain: Not on file  . Food insecurity:    Worry: Not on file  Inability: Not on file  . Transportation needs:    Medical: Not on file    Non-medical: Not on file  Tobacco Use  . Smoking status: Never Smoker  . Smokeless tobacco: Never Used  Substance and Sexual Activity  . Alcohol use: Not on file  . Drug use: Not on file  . Sexual activity: Not on file  Lifestyle  . Physical  activity:    Days per week: Not on file    Minutes per session: Not on file  . Stress: Not on file  Relationships  . Social connections:    Talks on phone: Not on file    Gets together: Not on file    Attends religious service: Not on file    Active member of club or organization: Not on file    Attends meetings of clubs or organizations: Not on file    Relationship status: Not on file  . Intimate partner violence:    Fear of current or ex partner: Not on file    Emotionally abused: Not on file    Physically abused: Not on file    Forced sexual activity: Not on file  Other Topics Concern  . Not on file  Social History Narrative  . Not on file    Family History  Problem Relation Age of Onset  . Alzheimer's disease Father   . Alzheimer's disease Maternal Grandmother      Current Outpatient Medications:  .  Cyanocobalamin (VITAMIN B 12 PO), Take 1,000 mcg by mouth daily., Disp: , Rfl:  .  primidone (MYSOLINE) 50 MG tablet, TAKE 1 TABLET(50 MG) BY MOUTH EVERY NIGHT, Disp: , Rfl:  .  simvastatin (ZOCOR) 20 MG tablet, TAKE 1 TABLET(20 MG) BY MOUTH EVERY NIGHT, Disp: , Rfl:  .  tamsulosin (FLOMAX) 0.4 MG CAPS capsule, TAKE 1 CAPSULE BY MOUTH DAILY. TAKE 30 MINUTES AFTER SAME MEAL EACH DAY, Disp: , Rfl:  .  cholecalciferol (VITAMIN D) 1000 units tablet, Take 1,000 Units by mouth daily., Disp: , Rfl:  .  omeprazole (PRILOSEC) 20 MG capsule, Take 20 mg by mouth 2 (two) times daily before a meal., Disp: , Rfl:  .  vitamin C (ASCORBIC ACID) 500 MG tablet, Take 500 mg by mouth daily., Disp: , Rfl:   Physical exam:  Vitals:   02/18/18 0935  BP: 113/71  Pulse: 72  Resp: 18  Temp: (!) 97 F (36.1 C)  TempSrc: Tympanic  SpO2: 100%  Weight: 213 lb 0.1 oz (96.6 kg)  Height: 5' 11"  (1.803 m)   Physical Exam  Constitutional: He is oriented to person, place, and time. He appears well-developed and well-nourished.  HENT:  Head: Normocephalic and atraumatic.  Eyes: Pupils are equal,  round, and reactive to light. EOM are normal.  Neck: Normal range of motion.  Cardiovascular: Normal rate, regular rhythm and normal heart sounds.  Pulmonary/Chest: Effort normal and breath sounds normal.  Abdominal: Soft. Bowel sounds are normal.  Neurological: He is alert and oriented to person, place, and time.  Skin: Skin is warm and dry.     CMP Latest Ref Rng & Units 01/07/2018  Glucose 70 - 99 mg/dL 93  BUN 8 - 23 mg/dL 25(H)  Creatinine 0.61 - 1.24 mg/dL 1.56(H)  Sodium 135 - 145 mmol/L 135  Potassium 3.5 - 5.1 mmol/L 4.5  Chloride 98 - 111 mmol/L 101  CO2 22 - 32 mmol/L 25  Calcium 8.9 - 10.3 mg/dL 8.9  Total Protein 6.5 - 8.1 g/dL  8.0  Total Bilirubin 0.3 - 1.2 mg/dL 0.6  Alkaline Phos 38 - 126 U/L 67  AST 15 - 41 U/L 21  ALT 0 - 44 U/L 21   CBC Latest Ref Rng & Units 02/05/2018  WBC 3.8 - 10.6 K/uL 6.8  Hemoglobin 13.0 - 18.0 g/dL 14.8  Hematocrit 40.0 - 52.0 % 42.4  Platelets 150 - 440 K/uL 176    No images are attached to the encounter.  Ct Bone Marrow Biopsy & Aspiration  Result Date: 02/05/2018 INDICATION: Monoclonal gammopathy EXAM: CT BONE MARROW BIOPSY AND ASPIRATION MEDICATIONS: None. ANESTHESIA/SEDATION: Fentanyl 100 mcg IV; Versed 2 mg IV Moderate Sedation Time:  14 minutes The patient was continuously monitored during the procedure by the interventional radiology nurse under my direct supervision. FLUOROSCOPY TIME:  Fluoroscopy Time:  minutes  seconds ( mGy). COMPLICATIONS: None immediate. PROCEDURE: Informed written consent was obtained from the patient after a thorough discussion of the procedural risks, benefits and alternatives. All questions were addressed. Maximal Sterile Barrier Technique was utilized including caps, mask, sterile gowns, sterile gloves, sterile drape, hand hygiene and skin antiseptic. A timeout was performed prior to the initiation of the procedure. Under CT guidance, a(n) 11 gauge guide needle was advanced into the right iliac bone.  Aspirates and a core were obtained. Post biopsy images demonstrate no hemorrhage. Patient tolerated the procedure well without complication. Vital sign monitoring by nursing staff during the procedure will continue as patient is in the special procedures unit for post procedure observation. FINDINGS: The images document guide needle placement within the right iliac bone. Post biopsy images demonstrate no hemorrhage. IMPRESSION: Successful CT-guided right iliac bone marrow aspirate and core. Electronically Signed   By: Marybelle Killings M.D.   On: 02/05/2018 10:18     Assessment and plan- Patient is a 72 y.o. male with IgG kappa MGUS  I discussed the results of the bone marrow biopsy which shows 2% clonal plasma cells but kappa light chain restriction.  He has a small amount of M protein 0.6 g IgG kappa.  Serum free light chain ratio is elevated at 5.  He is not anemic and he does not have any hypercalcemia.  Skeletal survey was negative for any lytic bone lesions.  He did have some evidence of chronic kidney disease with a creatinine of 1.5.  However overall labs are not consistent with multiple myeloma and bone marrow biopsy findings are consistent with IgG kappa MGUS.  If his kidney functions worsen in the future he will need kidney biopsy at that point as there is no evidence of overt multiple myeloma contributing to his kidney injury at this time.  Repeat CBC with differential, CMP, myeloma panel and serum free light chains in 3 months in 6 months and I will see him back in 6 months.  Labs to be done 1 week prior to his visit with me   Discussed natural history of MGUS and risk of progression to multiple myeloma.  His MGUS can be monitored conservatively at this time without any further intervention     Visit Diagnosis 1. MGUS (monoclonal gammopathy of unknown significance)      Dr. Randa Evens, MD, MPH [facility] [phone] 02/18/2018 10:52 AM

## 2018-02-18 NOTE — Progress Notes (Signed)
No new changes noted today 

## 2018-02-20 ENCOUNTER — Encounter (HOSPITAL_COMMUNITY): Payer: Self-pay | Admitting: Oncology

## 2018-03-22 ENCOUNTER — Other Ambulatory Visit: Payer: Self-pay | Admitting: Family Medicine

## 2018-03-22 DIAGNOSIS — R911 Solitary pulmonary nodule: Secondary | ICD-10-CM

## 2018-04-02 ENCOUNTER — Ambulatory Visit
Admission: RE | Admit: 2018-04-02 | Discharge: 2018-04-02 | Disposition: A | Discharge status: Home / Self Care | Payer: Medicare PPO | Source: Ambulatory Visit | Attending: Family Medicine | Admitting: Family Medicine

## 2018-04-02 DIAGNOSIS — I2510 Atherosclerotic heart disease of native coronary artery without angina pectoris: Secondary | ICD-10-CM | POA: Insufficient documentation

## 2018-04-02 DIAGNOSIS — R918 Other nonspecific abnormal finding of lung field: Secondary | ICD-10-CM | POA: Diagnosis not present

## 2018-04-02 DIAGNOSIS — I700 Atherosclerosis of aorta: Secondary | ICD-10-CM | POA: Diagnosis not present

## 2018-04-02 DIAGNOSIS — R911 Solitary pulmonary nodule: Secondary | ICD-10-CM

## 2018-05-20 ENCOUNTER — Inpatient Hospital Stay: Payer: Medicare PPO | Attending: Oncology

## 2018-05-20 DIAGNOSIS — D472 Monoclonal gammopathy: Secondary | ICD-10-CM | POA: Diagnosis present

## 2018-05-20 LAB — COMPREHENSIVE METABOLIC PANEL
ALK PHOS: 56 U/L (ref 38–126)
ALT: 26 U/L (ref 0–44)
AST: 23 U/L (ref 15–41)
Albumin: 4.3 g/dL (ref 3.5–5.0)
Anion gap: 9 (ref 5–15)
BUN: 20 mg/dL (ref 8–23)
CALCIUM: 9 mg/dL (ref 8.9–10.3)
CO2: 25 mmol/L (ref 22–32)
Chloride: 103 mmol/L (ref 98–111)
Creatinine, Ser: 1.53 mg/dL — ABNORMAL HIGH (ref 0.61–1.24)
GFR calc non Af Amer: 44 mL/min — ABNORMAL LOW (ref >60)
GFR, EST AFRICAN AMERICAN: 52 mL/min — AB (ref >60)
GLUCOSE: 101 mg/dL — AB (ref 70–99)
Potassium: 4.2 mmol/L (ref 3.5–5.1)
SODIUM: 137 mmol/L (ref 135–145)
Total Bilirubin: 0.9 mg/dL (ref 0.3–1.2)
Total Protein: 7.8 g/dL (ref 6.5–8.1)

## 2018-05-20 LAB — CBC WITH DIFFERENTIAL/PLATELET
Abs Immature Granulocytes: 0.02 10*3/uL (ref 0.00–0.07)
Basophils Absolute: 0 10*3/uL (ref 0.0–0.1)
Basophils Relative: 0 %
EOS ABS: 0.2 10*3/uL (ref 0.0–0.5)
EOS PCT: 3 %
HEMATOCRIT: 44.8 % (ref 39.0–52.0)
HEMOGLOBIN: 14.8 g/dL (ref 13.0–17.0)
Immature Granulocytes: 0 %
LYMPHS ABS: 1.4 10*3/uL (ref 0.7–4.0)
Lymphocytes Relative: 20 %
MCH: 30.5 pg (ref 26.0–34.0)
MCHC: 33 g/dL (ref 30.0–36.0)
MCV: 92.4 fL (ref 80.0–100.0)
MONOS PCT: 12 %
Monocytes Absolute: 0.8 10*3/uL (ref 0.1–1.0)
NRBC: 0 % (ref 0.0–0.2)
Neutro Abs: 4.8 10*3/uL (ref 1.7–7.7)
Neutrophils Relative %: 65 %
Platelets: 171 10*3/uL (ref 150–400)
RBC: 4.85 MIL/uL (ref 4.22–5.81)
RDW: 13.2 % (ref 11.5–15.5)
WBC: 7.2 10*3/uL (ref 4.0–10.5)

## 2018-05-21 LAB — KAPPA/LAMBDA LIGHT CHAINS
Kappa free light chain: 99.4 mg/L — ABNORMAL HIGH (ref 3.3–19.4)
Kappa, lambda light chain ratio: 6.06 — ABNORMAL HIGH (ref 0.26–1.65)
LAMDA FREE LIGHT CHAINS: 16.4 mg/L (ref 5.7–26.3)

## 2018-05-27 LAB — MULTIPLE MYELOMA PANEL, SERUM
ALBUMIN SERPL ELPH-MCNC: 3.9 g/dL (ref 2.9–4.4)
Albumin/Glob SerPl: 1.2 (ref 0.7–1.7)
Alpha 1: 0.2 g/dL (ref 0.0–0.4)
Alpha2 Glob SerPl Elph-Mcnc: 0.7 g/dL (ref 0.4–1.0)
B-Globulin SerPl Elph-Mcnc: 0.9 g/dL (ref 0.7–1.3)
Gamma Glob SerPl Elph-Mcnc: 1.5 g/dL (ref 0.4–1.8)
Globulin, Total: 3.3 g/dL (ref 2.2–3.9)
IGA: 100 mg/dL (ref 61–437)
IGM (IMMUNOGLOBULIN M), SRM: 27 mg/dL (ref 15–143)
IgG (Immunoglobin G), Serum: 1601 mg/dL — ABNORMAL HIGH (ref 700–1600)
M PROTEIN SERPL ELPH-MCNC: 0.8 g/dL — AB
TOTAL PROTEIN ELP: 7.2 g/dL (ref 6.0–8.5)

## 2018-08-06 ENCOUNTER — Encounter: Payer: Self-pay | Admitting: Oncology

## 2018-08-07 NOTE — Telephone Encounter (Signed)
We can move his entire visit by 3 months for now

## 2018-08-12 ENCOUNTER — Other Ambulatory Visit: Payer: Medicare PPO

## 2018-08-19 ENCOUNTER — Ambulatory Visit: Payer: Medicare PPO | Admitting: Oncology

## 2018-09-19 ENCOUNTER — Other Ambulatory Visit: Payer: Self-pay | Admitting: Family Medicine

## 2018-09-19 DIAGNOSIS — R911 Solitary pulmonary nodule: Secondary | ICD-10-CM

## 2018-09-24 ENCOUNTER — Other Ambulatory Visit: Payer: Self-pay

## 2018-09-24 ENCOUNTER — Ambulatory Visit
Admission: RE | Admit: 2018-09-24 | Discharge: 2018-09-24 | Disposition: A | Discharge status: Home / Self Care | Payer: Medicare PPO | Source: Ambulatory Visit | Attending: Family Medicine | Admitting: Family Medicine

## 2018-09-24 DIAGNOSIS — R911 Solitary pulmonary nodule: Secondary | ICD-10-CM | POA: Insufficient documentation

## 2018-10-30 ENCOUNTER — Other Ambulatory Visit: Payer: Self-pay | Admitting: *Deleted

## 2018-10-30 DIAGNOSIS — D8989 Other specified disorders involving the immune mechanism, not elsewhere classified: Secondary | ICD-10-CM

## 2018-10-30 DIAGNOSIS — D472 Monoclonal gammopathy: Secondary | ICD-10-CM

## 2018-11-06 ENCOUNTER — Other Ambulatory Visit: Payer: Self-pay

## 2018-11-07 ENCOUNTER — Inpatient Hospital Stay: Payer: Medicare PPO | Attending: Hematology and Oncology

## 2018-11-07 DIAGNOSIS — Z79899 Other long term (current) drug therapy: Secondary | ICD-10-CM | POA: Diagnosis not present

## 2018-11-07 DIAGNOSIS — I10 Essential (primary) hypertension: Secondary | ICD-10-CM | POA: Diagnosis not present

## 2018-11-07 DIAGNOSIS — D472 Monoclonal gammopathy: Secondary | ICD-10-CM | POA: Diagnosis not present

## 2018-11-07 DIAGNOSIS — N183 Chronic kidney disease, stage 3 (moderate): Secondary | ICD-10-CM | POA: Diagnosis not present

## 2018-11-07 DIAGNOSIS — D8989 Other specified disorders involving the immune mechanism, not elsewhere classified: Secondary | ICD-10-CM

## 2018-11-07 DIAGNOSIS — K219 Gastro-esophageal reflux disease without esophagitis: Secondary | ICD-10-CM | POA: Diagnosis not present

## 2018-11-07 DIAGNOSIS — E785 Hyperlipidemia, unspecified: Secondary | ICD-10-CM | POA: Insufficient documentation

## 2018-11-07 LAB — COMPREHENSIVE METABOLIC PANEL
ALT: 20 U/L (ref 0–44)
AST: 21 U/L (ref 15–41)
Albumin: 4.1 g/dL (ref 3.5–5.0)
Alkaline Phosphatase: 59 U/L (ref 38–126)
Anion gap: 7 (ref 5–15)
BUN: 19 mg/dL (ref 8–23)
CO2: 26 mmol/L (ref 22–32)
Calcium: 8.9 mg/dL (ref 8.9–10.3)
Chloride: 101 mmol/L (ref 98–111)
Creatinine, Ser: 1.47 mg/dL — ABNORMAL HIGH (ref 0.61–1.24)
GFR calc Af Amer: 54 mL/min — ABNORMAL LOW (ref >60)
GFR calc non Af Amer: 46 mL/min — ABNORMAL LOW (ref >60)
Glucose, Bld: 117 mg/dL — ABNORMAL HIGH (ref 70–99)
Potassium: 4.2 mmol/L (ref 3.5–5.1)
Sodium: 134 mmol/L — ABNORMAL LOW (ref 135–145)
Total Bilirubin: 0.7 mg/dL (ref 0.3–1.2)
Total Protein: 7.5 g/dL (ref 6.5–8.1)

## 2018-11-07 LAB — CBC WITH DIFFERENTIAL/PLATELET
Abs Immature Granulocytes: 0.04 10*3/uL (ref 0.00–0.07)
Basophils Absolute: 0 10*3/uL (ref 0.0–0.1)
Basophils Relative: 1 %
Eosinophils Absolute: 0.2 10*3/uL (ref 0.0–0.5)
Eosinophils Relative: 3 %
HCT: 45.3 % (ref 39.0–52.0)
Hemoglobin: 15.2 g/dL (ref 13.0–17.0)
Immature Granulocytes: 1 %
Lymphocytes Relative: 23 %
Lymphs Abs: 1.3 10*3/uL (ref 0.7–4.0)
MCH: 31 pg (ref 26.0–34.0)
MCHC: 33.6 g/dL (ref 30.0–36.0)
MCV: 92.4 fL (ref 80.0–100.0)
Monocytes Absolute: 0.7 10*3/uL (ref 0.1–1.0)
Monocytes Relative: 11 %
Neutro Abs: 3.6 10*3/uL (ref 1.7–7.7)
Neutrophils Relative %: 61 %
Platelets: 185 10*3/uL (ref 150–400)
RBC: 4.9 MIL/uL (ref 4.22–5.81)
RDW: 12.8 % (ref 11.5–15.5)
WBC: 5.7 10*3/uL (ref 4.0–10.5)
nRBC: 0 % (ref 0.0–0.2)

## 2018-11-08 ENCOUNTER — Other Ambulatory Visit: Payer: Medicare PPO

## 2018-11-08 LAB — KAPPA/LAMBDA LIGHT CHAINS
Kappa free light chain: 121.4 mg/L — ABNORMAL HIGH (ref 3.3–19.4)
Kappa, lambda light chain ratio: 8.93 — ABNORMAL HIGH (ref 0.26–1.65)
Lambda free light chains: 13.6 mg/L (ref 5.7–26.3)

## 2018-11-14 ENCOUNTER — Other Ambulatory Visit: Payer: Self-pay

## 2018-11-14 LAB — MULTIPLE MYELOMA PANEL, SERUM
Albumin SerPl Elph-Mcnc: 3.8 g/dL (ref 2.9–4.4)
Albumin/Glob SerPl: 1.3 (ref 0.7–1.7)
Alpha 1: 0.2 g/dL (ref 0.0–0.4)
Alpha2 Glob SerPl Elph-Mcnc: 0.7 g/dL (ref 0.4–1.0)
B-Globulin SerPl Elph-Mcnc: 0.7 g/dL (ref 0.7–1.3)
Gamma Glob SerPl Elph-Mcnc: 1.5 g/dL (ref 0.4–1.8)
Globulin, Total: 3 g/dL (ref 2.2–3.9)
IgA: 86 mg/dL (ref 61–437)
IgG (Immunoglobin G), Serum: 1696 mg/dL — ABNORMAL HIGH (ref 603–1613)
IgM (Immunoglobulin M), Srm: 29 mg/dL (ref 15–143)
M Protein SerPl Elph-Mcnc: 0.8 g/dL — ABNORMAL HIGH
Total Protein ELP: 6.8 g/dL (ref 6.0–8.5)

## 2018-11-18 ENCOUNTER — Other Ambulatory Visit: Payer: Self-pay

## 2018-11-18 ENCOUNTER — Ambulatory Visit
Admission: RE | Admit: 2018-11-18 | Discharge: 2018-11-18 | Disposition: A | Discharge status: Home / Self Care | Payer: Medicare PPO | Source: Ambulatory Visit | Attending: Oncology | Admitting: Oncology

## 2018-11-18 ENCOUNTER — Inpatient Hospital Stay: Payer: Medicare PPO | Attending: Oncology | Admitting: Oncology

## 2018-11-18 ENCOUNTER — Encounter: Payer: Self-pay | Admitting: Oncology

## 2018-11-18 ENCOUNTER — Ambulatory Visit
Admission: RE | Admit: 2018-11-18 | Discharge: 2018-11-18 | Disposition: A | Discharge status: Home / Self Care | Payer: Medicare PPO | Attending: Oncology | Admitting: Oncology

## 2018-11-18 VITALS — BP 153/80 | HR 75 | Temp 96.8°F | Wt 200.9 lb

## 2018-11-18 DIAGNOSIS — F419 Anxiety disorder, unspecified: Secondary | ICD-10-CM | POA: Diagnosis not present

## 2018-11-18 DIAGNOSIS — R42 Dizziness and giddiness: Secondary | ICD-10-CM | POA: Diagnosis not present

## 2018-11-18 DIAGNOSIS — I129 Hypertensive chronic kidney disease with stage 1 through stage 4 chronic kidney disease, or unspecified chronic kidney disease: Secondary | ICD-10-CM | POA: Insufficient documentation

## 2018-11-18 DIAGNOSIS — N183 Chronic kidney disease, stage 3 (moderate): Secondary | ICD-10-CM

## 2018-11-18 DIAGNOSIS — R7989 Other specified abnormal findings of blood chemistry: Secondary | ICD-10-CM | POA: Insufficient documentation

## 2018-11-18 DIAGNOSIS — D472 Monoclonal gammopathy: Secondary | ICD-10-CM

## 2018-11-18 DIAGNOSIS — N400 Enlarged prostate without lower urinary tract symptoms: Secondary | ICD-10-CM | POA: Diagnosis not present

## 2018-11-18 DIAGNOSIS — E785 Hyperlipidemia, unspecified: Secondary | ICD-10-CM | POA: Diagnosis not present

## 2018-11-18 DIAGNOSIS — K219 Gastro-esophageal reflux disease without esophagitis: Secondary | ICD-10-CM | POA: Diagnosis not present

## 2018-11-18 DIAGNOSIS — E7800 Pure hypercholesterolemia, unspecified: Secondary | ICD-10-CM | POA: Insufficient documentation

## 2018-11-18 DIAGNOSIS — Z79899 Other long term (current) drug therapy: Secondary | ICD-10-CM

## 2018-11-18 NOTE — Progress Notes (Signed)
Hematology/Oncology Consult note [facility]  Telephone:([phone] [phone]  Patient Care Team: Sofie Hartigan, MD as PCP - General (Family Medicine)   Name of the patient: Steve Warren  [id number]  [birth year]   Date of visit: 11/18/18  Diagnosis- IgG kappa MGUS  Chief complaint/ Reason for visit-routine follow-up of MDS  Heme/Onc history: patient is 73 year old male who was recently seen by rheumatology for positive ANA. He has a history of stage III chronic kidney disease, hypertension, hyperlipidemia and GERD. He has been referred to [place] for abnormal SPEP. His most recent labs from 11/07/2017 showed white count of 8.3, H&H of 12.8/38 with an MCV of 93.4 and a platelet count of 224. LFTs were within normal limits. Serum calcium was 9.3. Serum creatinine was elevated at 1.58. As a part of work-up for chronic kidney disease patient had an SPEP done which showed abnormal protein in the gammaglobulin and a possible second abnormal protein which may represent monoclonal immunoglobulins. Recent CBC from 12/20/2017 showed H&H of 14.5/44.5.  Results of blood work from 01/07/2018 were as follows: CBC showed white count of 6.8, H&H of 14.5/42.4 and a platelet count of 196. CMP was significant for elevated creatinine of 1.56. Multiple myeloma panel showed 0.6 g of IgG kappa light chain monoclonal protein. Free kappa light chain was elevated at 92.2 and kappa lambda light chain ratio was abnormal at 5.95. Iron studies and folate were normal. B12 levels were low at 224  Bone marrow biopsy showed 2% plasma cells with kappa light chain restriction   Interval history-reports having some lightheadedness when he changes position.  He has lost about 13 pounds over the last 9 months.  Denies any new aches or pains anywhere  ECOG PS- 1 Pain scale- 0 Opioid associated constipation- no  Review of systems- Review of Systems  Constitutional: Negative  for chills, fever, malaise/fatigue and weight loss.  HENT: Negative for congestion, ear discharge and nosebleeds.   Eyes: Negative for blurred vision.  Respiratory: Negative for cough, hemoptysis, sputum production, shortness of breath and wheezing.   Cardiovascular: Negative for chest pain, palpitations, orthopnea and claudication.  Gastrointestinal: Negative for abdominal pain, blood in stool, constipation, diarrhea, heartburn, melena, nausea and vomiting.  Genitourinary: Negative for dysuria, flank pain, frequency, hematuria and urgency.  Musculoskeletal: Negative for back pain, joint pain and myalgias.  Skin: Negative for rash.  Neurological: Positive for dizziness. Negative for tingling, focal weakness, seizures, weakness and headaches.  Endo/Heme/Allergies: Does not bruise/bleed easily.  Psychiatric/Behavioral: Negative for depression and suicidal ideas. The patient does not have insomnia.       Not on File   Past Medical History:  Diagnosis Date  . Anxiety   . BPH (benign prostatic hyperplasia)   . GERD (gastroesophageal reflux disease)   . Hypercholesterolemia   . Hyperlipidemia   . Hypertension      Past Surgical History:  Procedure Laterality Date  . APPENDECTOMY    . COLONOSCOPY    . TONSILLECTOMY      Social History   Socioeconomic History  . Marital status: Married    Spouse name: Not on file  . Number of children: Not on file  . Years of education: Not on file  . Highest education level: Not on file  Occupational History  . Not on file  Social Needs  . Financial resource strain: Not on file  . Food insecurity    Worry: Not on file    Inability: Not on  file  . Transportation needs    Medical: Not on file    Non-medical: Not on file  Tobacco Use  . Smoking status: Never Smoker  . Smokeless tobacco: Never Used  Substance and Sexual Activity  . Alcohol use: Not on file  . Drug use: Not on file  . Sexual activity: Not on file  Lifestyle  .  Physical activity    Days per week: Not on file    Minutes per session: Not on file  . Stress: Not on file  Relationships  . Social Herbalist on phone: Not on file    Gets together: Not on file    Attends religious service: Not on file    Active member of club or organization: Not on file    Attends meetings of clubs or organizations: Not on file    Relationship status: Not on file  . Intimate partner violence    Fear of current or ex partner: Not on file    Emotionally abused: Not on file    Physically abused: Not on file    Forced sexual activity: Not on file  Other Topics Concern  . Not on file  Social History Narrative  . Not on file    Family History  Problem Relation Age of Onset  . Alzheimer's disease Father   . Alzheimer's disease Maternal Grandmother      Current Outpatient Medications:  .  cholecalciferol (VITAMIN D) 1000 units tablet, Take 1,000 Units by mouth daily., Disp: , Rfl:  .  Cyanocobalamin (VITAMIN B 12 PO), Take 1,000 mcg by mouth daily., Disp: , Rfl:  .  omeprazole (PRILOSEC) 20 MG capsule, Take 20 mg by mouth 2 (two) times daily before a meal., Disp: , Rfl:  .  primidone (MYSOLINE) 50 MG tablet, TAKE 1 TABLET(50 MG) BY MOUTH EVERY NIGHT, Disp: , Rfl:  .  simvastatin (ZOCOR) 20 MG tablet, TAKE 1 TABLET(20 MG) BY MOUTH EVERY NIGHT, Disp: , Rfl:  .  tamsulosin (FLOMAX) 0.4 MG CAPS capsule, TAKE 1 CAPSULE BY MOUTH DAILY. TAKE 30 MINUTES AFTER SAME MEAL EACH DAY, Disp: , Rfl:  .  vitamin C (ASCORBIC ACID) 500 MG tablet, Take 500 mg by mouth daily., Disp: , Rfl:   Physical exam:  Vitals:   11/18/18 0951  BP: (!) 153/80  Pulse: 75  Temp: (!) 96.8 F (36 C)  TempSrc: Tympanic  Weight: 200 lb 14.4 oz (91.1 kg)   Physical Exam HENT:     Head: Normocephalic and atraumatic.  Eyes:     Pupils: Pupils are equal, round, and reactive to light.  Neck:     Musculoskeletal: Normal range of motion.  Cardiovascular:     Rate and Rhythm: Normal  rate and regular rhythm.     Heart sounds: Normal heart sounds.  Pulmonary:     Effort: Pulmonary effort is normal.     Breath sounds: Normal breath sounds.  Abdominal:     General: Bowel sounds are normal.     Palpations: Abdomen is soft.  Skin:    General: Skin is warm and dry.  Neurological:     Mental Status: He is alert and oriented to person, place, and time.      CMP Latest Ref Rng & Units 11/07/2018  Glucose 70 - 99 mg/dL 117(H)  BUN 8 - 23 mg/dL 19  Creatinine 0.61 - 1.24 mg/dL 1.47(H)  Sodium 135 - 145 mmol/L 134(L)  Potassium 3.5 - 5.1 mmol/L 4.2  Chloride 98 - 111 mmol/L 101  CO2 22 - 32 mmol/L 26  Calcium 8.9 - 10.3 mg/dL 8.9  Total Protein 6.5 - 8.1 g/dL 7.5  Total Bilirubin 0.3 - 1.2 mg/dL 0.7  Alkaline Phos 38 - 126 U/L 59  AST 15 - 41 U/L 21  ALT 0 - 44 U/L 20   CBC Latest Ref Rng & Units 11/07/2018  WBC 4.0 - 10.5 K/uL 5.7  Hemoglobin 13.0 - 17.0 g/dL 15.2  Hematocrit 39.0 - 52.0 % 45.3  Platelets 150 - 400 K/uL 185      Assessment and plan- Patient is a 73 y.o. male with IgG kappa MGUS  Free kappa serum light chain ratio has increased from 6-8.  M protein remains stable at 0.8.  Kidney functions are stable with a creatinine around 1.4.  No evidence of anemia or Hypercalcemia.  Bone marrow biopsy in September 2019 showed 20% plasma cells.  At this time I am inclined to monitor these labs conservatively without a repeat bone marrow biopsy.  If there is significant and a consistent increase in his serum free light chains I will consider getting a repeat bone marrow biopsy at that time.   I will get a bone survey at this time which would be his yearly bone survey.  Repeat CBC with differential, CMP, myeloma panel and serum free light chains in 3 months in 6 months and I will see him back in 6 months  Lightheadedness when changing position likely secondary to postural hypotension.  Unrelated to MGUS.  I have asked him to follow-up with his primary care doctor  for this.   Visit Diagnosis 1. MGUS (monoclonal gammopathy of unknown significance)      Dr. Randa Evens, MD, MPH [facility] [phone] 11/18/2018 10:34 AM

## 2019-01-06 IMAGING — CT CT CHEST W/O CM
1 series · 15 of 34 positions shown, 19 images · non-contrast
Comparison: CT chest of 10/10/2016

CLINICAL DATA: Follow-up lung nodule

EXAM:
CT CHEST WITHOUT CONTRAST
TECHNIQUE: Multidetector CT imaging of the chest was performed following the
standard protocol without IV contrast.

[Series 2: thorax · axial · 0.74mm/px · z∈[-237,+9]mm · 15 of 145 slices shown, 19 images]
[im 11/145  mediastinal]
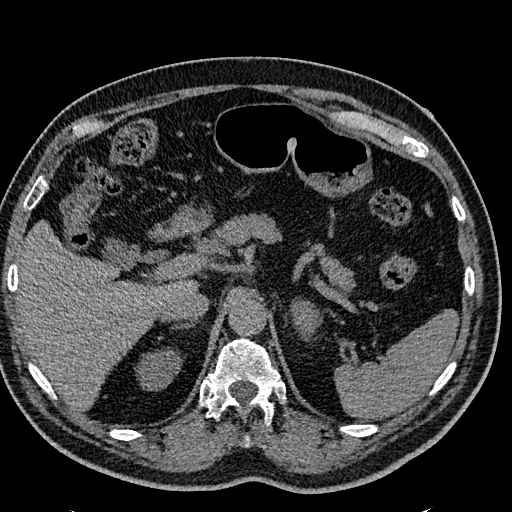
[im 11/145  lung]
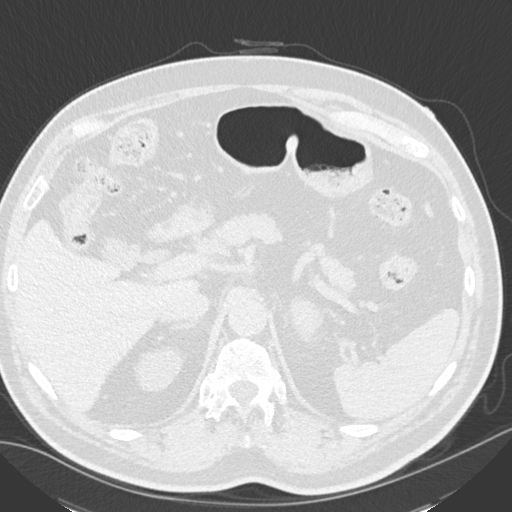
[im 22/145  lung]
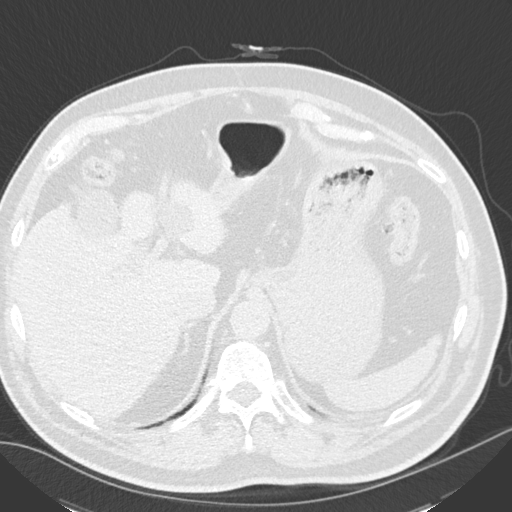
[im 29/145  lung]
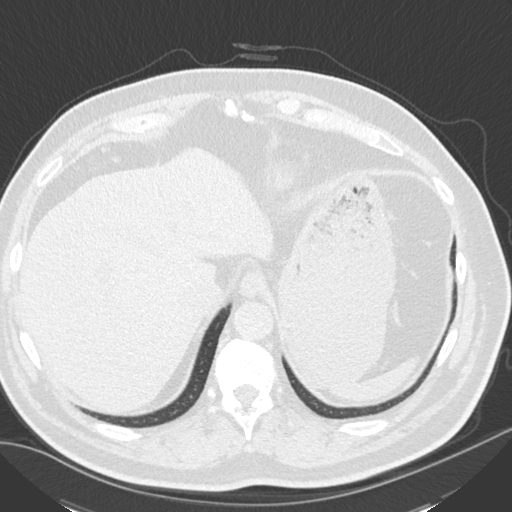
[im 38/145  lung]
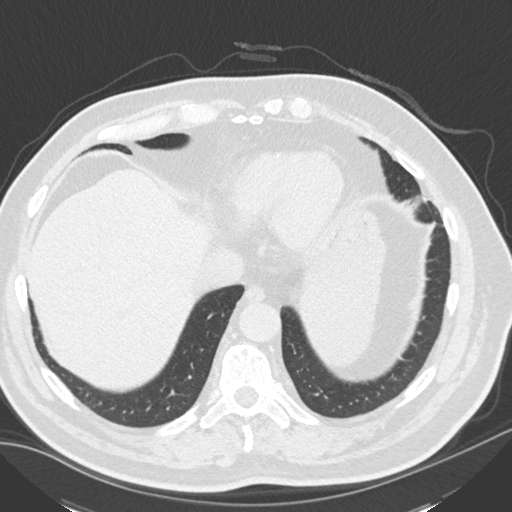
[im 49/145  mediastinal]
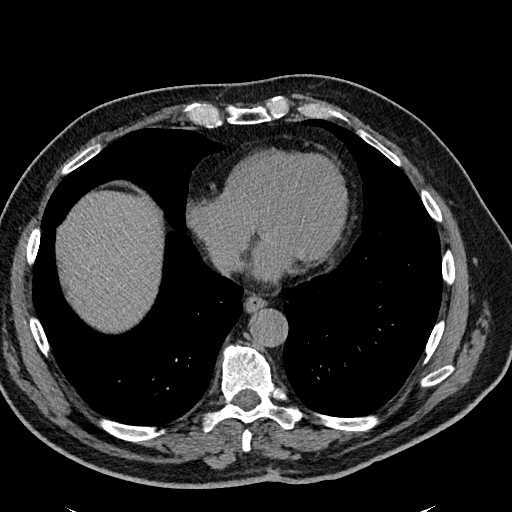
[im 49/145  lung]
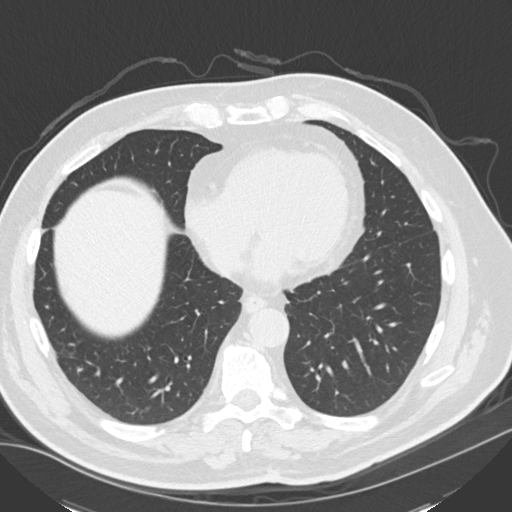
[im 58/145  lung]
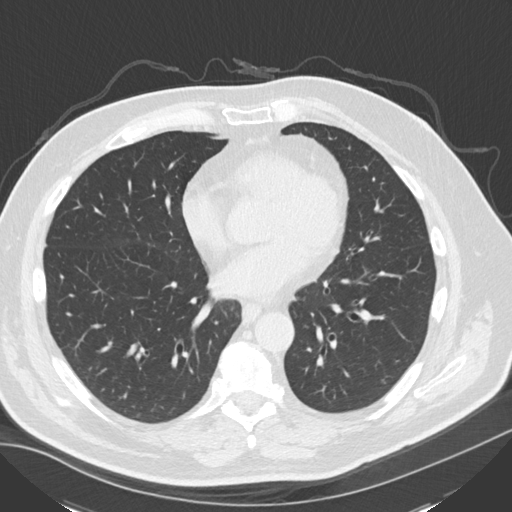
[im 65/145  lung]
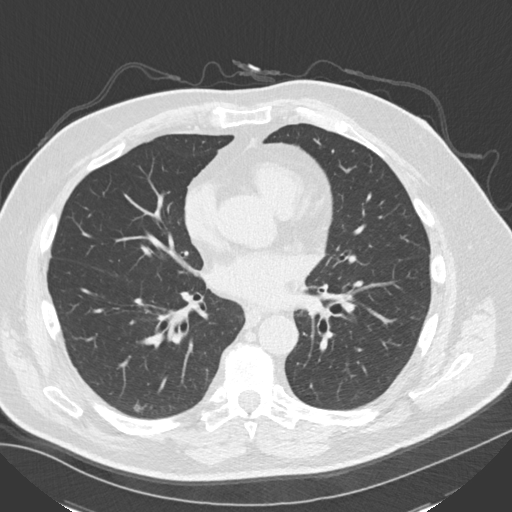
[im 75/145  lung]
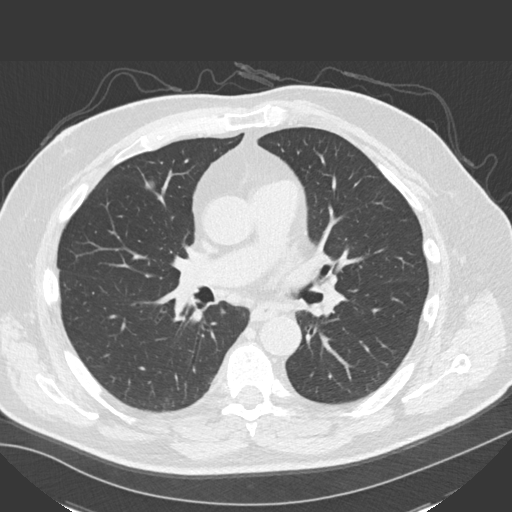
[im 81/145  mediastinal]
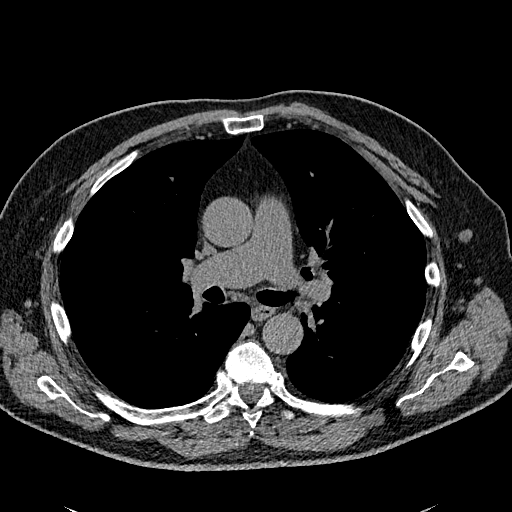
[im 81/145  lung]
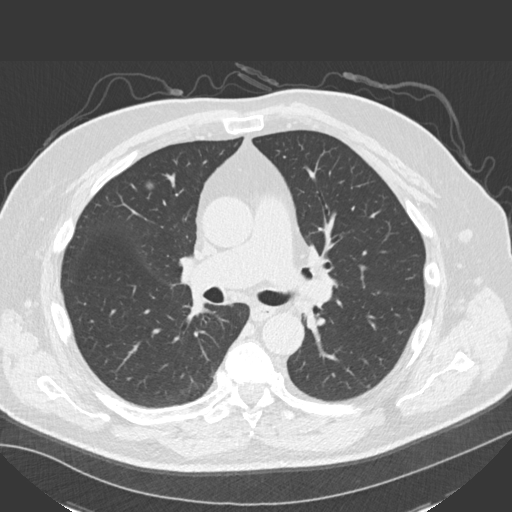
[im 87/145  lung]
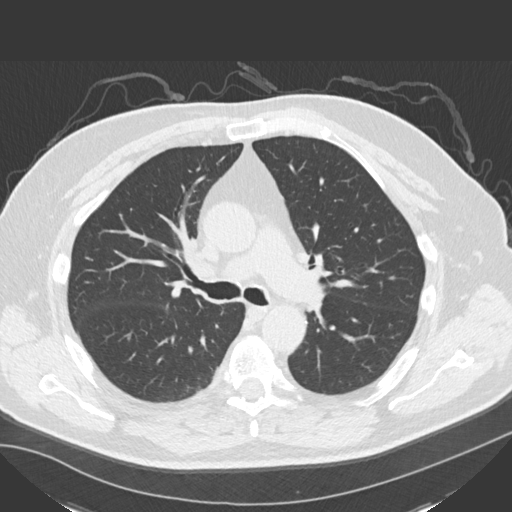
[im 97/145  lung]
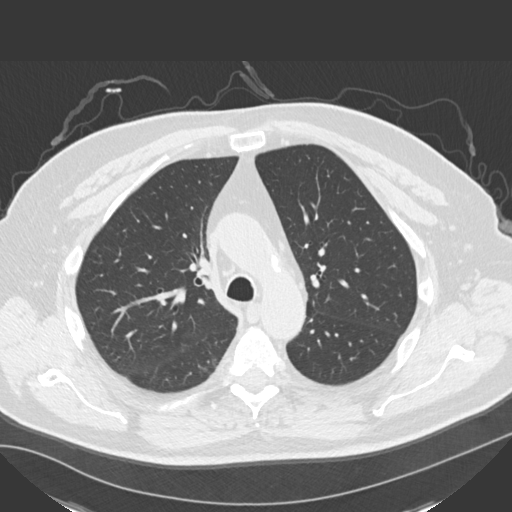
[im 107/145  lung]
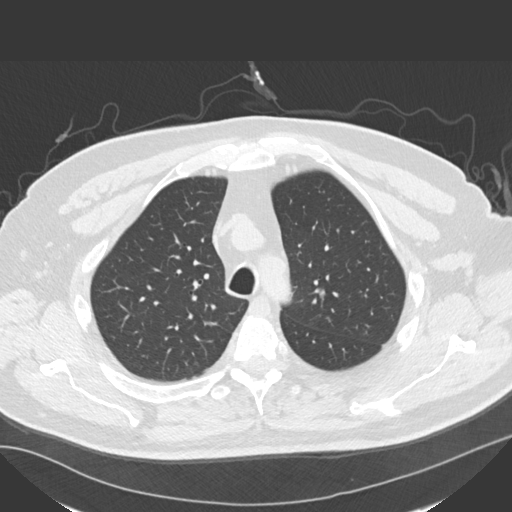
[im 116/145  mediastinal]
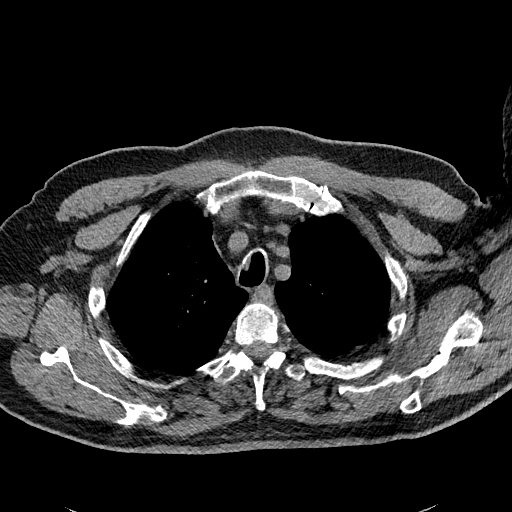
[im 116/145  lung]
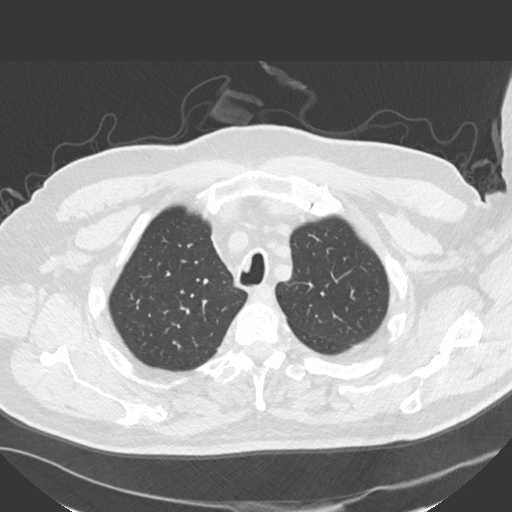
[im 123/145  lung]
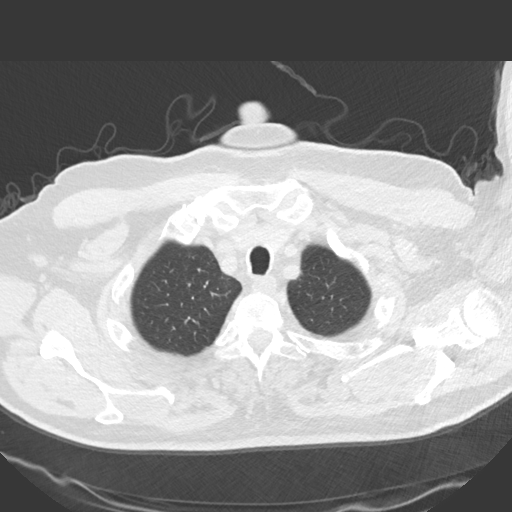
[im 134/145  lung]
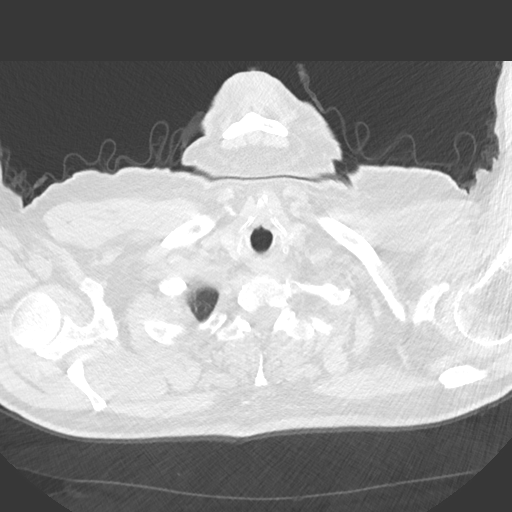

[15 of 34 positions shown; findings below may reference images not displayed]

FINDINGS: Cardiovascular: Moderate thoracic aortic atherosclerosis again is
noted. The pulmonary artery segments are slightly prominent which
may indicate a degree of pulmonary arterial hypertension. Coronary
artery calcifications are noted primarily in the distribution of the
left anterior descending coronary artery. The heart is within normal
limits in size. Pericardial and epicardial fat is noted.

Mediastinum/Nodes: No mediastinal or hilar adenopathy is seen on
this unenhanced study. The thyroid gland is unremarkable. The
esophagus appears normal.

Lungs/Pleura: All of the previously described nodules are completely
stable. The dominant nodule appears to contain fat within the
anterior inferior right upper lobe on image 68 series 3 of 11 mm in
diameter, most consistent with hamartoma. Smaller nodules of no more
than 6 mm in diameter also are scattered throughout the lungs and
are completely stable. No new or enlarging nodule is seen. No
parenchymal infiltrate is noted and there is no evidence of pleural
effusion. Since these nodules are stable after 6 months, and
additional follow-up CT the chest is recommended in the next 6
months.

Upper Abdomen: A cyst in the left lobe of liver is stable. No
calcified gallstones are seen. The pancreas is unremarkable. No
abnormality is noted within the upper abdomen on the images
obtained.

Musculoskeletal: There are degenerative changes in the mid to lower
thoracic spine. No acute abnormality is seen.
IMPRESSION: 1. Stable nodules, the dominant nodule representing a hematoma in
all likelihood within the anterior inferior right upper lobe. In
view of the stability of the other nodules, a follow-up CT of the
chest is recommended in 6 months.
2. Moderate thoracic aortic atherosclerosis and coronary artery
calcifications.

## 2019-02-18 ENCOUNTER — Other Ambulatory Visit: Payer: Self-pay

## 2019-02-18 ENCOUNTER — Inpatient Hospital Stay: Payer: Medicare PPO | Attending: Hematology and Oncology

## 2019-02-18 DIAGNOSIS — D472 Monoclonal gammopathy: Secondary | ICD-10-CM | POA: Diagnosis not present

## 2019-02-18 LAB — COMPREHENSIVE METABOLIC PANEL
ALT: 17 U/L (ref 0–44)
AST: 17 U/L (ref 15–41)
Albumin: 4.4 g/dL (ref 3.5–5.0)
Alkaline Phosphatase: 64 U/L (ref 38–126)
Anion gap: 7 (ref 5–15)
BUN: 23 mg/dL (ref 8–23)
CO2: 26 mmol/L (ref 22–32)
Calcium: 9.1 mg/dL (ref 8.9–10.3)
Chloride: 98 mmol/L (ref 98–111)
Creatinine, Ser: 1.37 mg/dL — ABNORMAL HIGH (ref 0.61–1.24)
GFR calc Af Amer: 58 mL/min — ABNORMAL LOW (ref >60)
GFR calc non Af Amer: 50 mL/min — ABNORMAL LOW (ref >60)
Glucose, Bld: 97 mg/dL (ref 70–99)
Potassium: 4.3 mmol/L (ref 3.5–5.1)
Sodium: 131 mmol/L — ABNORMAL LOW (ref 135–145)
Total Bilirubin: 0.7 mg/dL (ref 0.3–1.2)
Total Protein: 8.3 g/dL — ABNORMAL HIGH (ref 6.5–8.1)

## 2019-02-18 LAB — CBC WITH DIFFERENTIAL/PLATELET
Abs Immature Granulocytes: 0.02 10*3/uL (ref 0.00–0.07)
Basophils Absolute: 0 10*3/uL (ref 0.0–0.1)
Basophils Relative: 1 %
Eosinophils Absolute: 0.2 10*3/uL (ref 0.0–0.5)
Eosinophils Relative: 3 %
HCT: 46.2 % (ref 39.0–52.0)
Hemoglobin: 15.6 g/dL (ref 13.0–17.0)
Immature Granulocytes: 0 %
Lymphocytes Relative: 19 %
Lymphs Abs: 1 10*3/uL (ref 0.7–4.0)
MCH: 31.2 pg (ref 26.0–34.0)
MCHC: 33.8 g/dL (ref 30.0–36.0)
MCV: 92.4 fL (ref 80.0–100.0)
Monocytes Absolute: 0.6 10*3/uL (ref 0.1–1.0)
Monocytes Relative: 12 %
Neutro Abs: 3.5 10*3/uL (ref 1.7–7.7)
Neutrophils Relative %: 65 %
Platelets: 178 10*3/uL (ref 150–400)
RBC: 5 MIL/uL (ref 4.22–5.81)
RDW: 13.1 % (ref 11.5–15.5)
WBC: 5.4 10*3/uL (ref 4.0–10.5)
nRBC: 0 % (ref 0.0–0.2)

## 2019-02-19 LAB — KAPPA/LAMBDA LIGHT CHAINS
Kappa free light chain: 138.3 mg/L — ABNORMAL HIGH (ref 3.3–19.4)
Kappa, lambda light chain ratio: 9.88 — ABNORMAL HIGH (ref 0.26–1.65)
Lambda free light chains: 14 mg/L (ref 5.7–26.3)

## 2019-02-20 LAB — MULTIPLE MYELOMA PANEL, SERUM
Albumin SerPl Elph-Mcnc: 3.8 g/dL (ref 2.9–4.4)
Albumin/Glob SerPl: 1.1 (ref 0.7–1.7)
Alpha 1: 0.2 g/dL (ref 0.0–0.4)
Alpha2 Glob SerPl Elph-Mcnc: 0.8 g/dL (ref 0.4–1.0)
B-Globulin SerPl Elph-Mcnc: 0.8 g/dL (ref 0.7–1.3)
Gamma Glob SerPl Elph-Mcnc: 1.7 g/dL (ref 0.4–1.8)
Globulin, Total: 3.5 g/dL (ref 2.2–3.9)
IgA: 91 mg/dL (ref 61–437)
IgG (Immunoglobin G), Serum: 1729 mg/dL — ABNORMAL HIGH (ref 603–1613)
IgM (Immunoglobulin M), Srm: 28 mg/dL (ref 15–143)
M Protein SerPl Elph-Mcnc: 1 g/dL — ABNORMAL HIGH
Total Protein ELP: 7.3 g/dL (ref 6.0–8.5)

## 2019-04-03 DIAGNOSIS — R809 Proteinuria, unspecified: Secondary | ICD-10-CM | POA: Insufficient documentation

## 2019-04-03 DIAGNOSIS — N179 Acute kidney failure, unspecified: Secondary | ICD-10-CM | POA: Insufficient documentation

## 2019-05-22 ENCOUNTER — Ambulatory Visit: Payer: Medicare PPO | Admitting: Oncology

## 2019-05-22 ENCOUNTER — Other Ambulatory Visit: Payer: Medicare PPO

## 2019-05-27 ENCOUNTER — Other Ambulatory Visit: Payer: Self-pay

## 2019-05-27 DIAGNOSIS — D472 Monoclonal gammopathy: Secondary | ICD-10-CM

## 2019-05-28 ENCOUNTER — Inpatient Hospital Stay: Payer: Medicare PPO | Attending: Hematology and Oncology

## 2019-05-28 DIAGNOSIS — Z79899 Other long term (current) drug therapy: Secondary | ICD-10-CM | POA: Diagnosis not present

## 2019-05-28 DIAGNOSIS — K219 Gastro-esophageal reflux disease without esophagitis: Secondary | ICD-10-CM | POA: Diagnosis not present

## 2019-05-28 DIAGNOSIS — E785 Hyperlipidemia, unspecified: Secondary | ICD-10-CM | POA: Diagnosis not present

## 2019-05-28 DIAGNOSIS — D472 Monoclonal gammopathy: Secondary | ICD-10-CM | POA: Diagnosis present

## 2019-05-28 DIAGNOSIS — N400 Enlarged prostate without lower urinary tract symptoms: Secondary | ICD-10-CM | POA: Insufficient documentation

## 2019-05-28 DIAGNOSIS — N183 Chronic kidney disease, stage 3 unspecified: Secondary | ICD-10-CM | POA: Diagnosis not present

## 2019-05-28 DIAGNOSIS — I129 Hypertensive chronic kidney disease with stage 1 through stage 4 chronic kidney disease, or unspecified chronic kidney disease: Secondary | ICD-10-CM | POA: Insufficient documentation

## 2019-05-28 DIAGNOSIS — E8581 Light chain (AL) amyloidosis: Secondary | ICD-10-CM | POA: Diagnosis not present

## 2019-05-28 LAB — COMPREHENSIVE METABOLIC PANEL
ALT: 21 U/L (ref 0–44)
AST: 20 U/L (ref 15–41)
Albumin: 4.1 g/dL (ref 3.5–5.0)
Alkaline Phosphatase: 58 U/L (ref 38–126)
Anion gap: 6 (ref 5–15)
BUN: 20 mg/dL (ref 8–23)
CO2: 27 mmol/L (ref 22–32)
Calcium: 8.7 mg/dL — ABNORMAL LOW (ref 8.9–10.3)
Chloride: 99 mmol/L (ref 98–111)
Creatinine, Ser: 1.27 mg/dL — ABNORMAL HIGH (ref 0.61–1.24)
GFR calc Af Amer: >60 mL/min (ref >60)
GFR calc non Af Amer: 55 mL/min — ABNORMAL LOW (ref >60)
Glucose, Bld: 106 mg/dL — ABNORMAL HIGH (ref 70–99)
Potassium: 4.5 mmol/L (ref 3.5–5.1)
Sodium: 132 mmol/L — ABNORMAL LOW (ref 135–145)
Total Bilirubin: 0.7 mg/dL (ref 0.3–1.2)
Total Protein: 8 g/dL (ref 6.5–8.1)

## 2019-05-28 LAB — CBC WITH DIFFERENTIAL/PLATELET
Abs Immature Granulocytes: 0.03 10*3/uL (ref 0.00–0.07)
Basophils Absolute: 0 10*3/uL (ref 0.0–0.1)
Basophils Relative: 0 %
Eosinophils Absolute: 0.1 10*3/uL (ref 0.0–0.5)
Eosinophils Relative: 2 %
HCT: 46.5 % (ref 39.0–52.0)
Hemoglobin: 15.4 g/dL (ref 13.0–17.0)
Immature Granulocytes: 1 %
Lymphocytes Relative: 19 %
Lymphs Abs: 1.1 10*3/uL (ref 0.7–4.0)
MCH: 30.6 pg (ref 26.0–34.0)
MCHC: 33.1 g/dL (ref 30.0–36.0)
MCV: 92.3 fL (ref 80.0–100.0)
Monocytes Absolute: 0.8 10*3/uL (ref 0.1–1.0)
Monocytes Relative: 14 %
Neutro Abs: 3.6 10*3/uL (ref 1.7–7.7)
Neutrophils Relative %: 64 %
Platelets: 182 10*3/uL (ref 150–400)
RBC: 5.04 MIL/uL (ref 4.22–5.81)
RDW: 12.9 % (ref 11.5–15.5)
WBC: 5.7 10*3/uL (ref 4.0–10.5)
nRBC: 0 % (ref 0.0–0.2)

## 2019-05-29 ENCOUNTER — Inpatient Hospital Stay: Payer: Medicare PPO | Admitting: Oncology

## 2019-05-29 LAB — KAPPA/LAMBDA LIGHT CHAINS
Kappa free light chain: 128.2 mg/L — ABNORMAL HIGH (ref 3.3–19.4)
Kappa, lambda light chain ratio: 9.86 — ABNORMAL HIGH (ref 0.26–1.65)
Lambda free light chains: 13 mg/L (ref 5.7–26.3)

## 2019-06-02 LAB — MULTIPLE MYELOMA PANEL, SERUM
Albumin SerPl Elph-Mcnc: 4 g/dL (ref 2.9–4.4)
Albumin/Glob SerPl: 1.3 (ref 0.7–1.7)
Alpha 1: 0.2 g/dL (ref 0.0–0.4)
Alpha2 Glob SerPl Elph-Mcnc: 0.7 g/dL (ref 0.4–1.0)
B-Globulin SerPl Elph-Mcnc: 0.8 g/dL (ref 0.7–1.3)
Gamma Glob SerPl Elph-Mcnc: 1.7 g/dL (ref 0.4–1.8)
Globulin, Total: 3.3 g/dL (ref 2.2–3.9)
IgA: 82 mg/dL (ref 61–437)
IgG (Immunoglobin G), Serum: 1862 mg/dL — ABNORMAL HIGH (ref 603–1613)
IgM (Immunoglobulin M), Srm: 23 mg/dL (ref 15–143)
M Protein SerPl Elph-Mcnc: 1 g/dL — ABNORMAL HIGH
Total Protein ELP: 7.3 g/dL (ref 6.0–8.5)

## 2019-06-06 ENCOUNTER — Other Ambulatory Visit: Payer: Self-pay

## 2019-06-06 ENCOUNTER — Encounter: Payer: Self-pay | Admitting: Oncology

## 2019-06-06 ENCOUNTER — Inpatient Hospital Stay: Payer: Medicare PPO | Admitting: Oncology

## 2019-06-06 ENCOUNTER — Inpatient Hospital Stay (HOSPITAL_BASED_OUTPATIENT_CLINIC_OR_DEPARTMENT_OTHER): Payer: Medicare PPO | Admitting: Oncology

## 2019-06-06 DIAGNOSIS — D8989 Other specified disorders involving the immune mechanism, not elsewhere classified: Secondary | ICD-10-CM

## 2019-06-06 DIAGNOSIS — D472 Monoclonal gammopathy: Secondary | ICD-10-CM

## 2019-06-09 NOTE — Progress Notes (Signed)
I connected with Steve Warren on 06/09/19 at 10:45 AM EST by video enabled telemedicine visit and verified that I am speaking with the correct person using two identifiers.   I discussed the limitations, risks, security and privacy concerns of performing an evaluation and management service by telemedicine and the availability of in-person appointments. I also discussed with the patient that there may be a patient responsible charge related to this service. The patient expressed understanding and agreed to proceed.  Other persons participating in the visit and their role in the encounter:  none  Patient's location:  home Provider's location:  work  Risk analyst Complaint:  Routine f/u of IgG kappa MGUS  History of present illness: patient is 74 year old male who was recently seen by rheumatology for positive ANA. He has a history of stage III chronic kidney disease, hypertension, hyperlipidemia and GERD. He has been referred to [place] for abnormal SPEP. His most recent labs from 11/07/2017 showed white count of 8.3, H&H of 12.8/38 with an MCV of 93.4 and a platelet count of 224. LFTs were within normal limits. Serum calcium was 9.3. Serum creatinine was elevated at 1.58. As a part of work-up for chronic kidney disease patient had an SPEP done which showed abnormal protein in the gammaglobulin and a possible second abnormal protein which may represent monoclonal immunoglobulins. Recent CBC from 12/20/2017 showed H&H of 14.5/44.5.  Results of blood work from 01/07/2018 were as follows: CBC showed white count of 6.8, H&H of 14.5/42.4 and a platelet count of 196. CMP was significant for elevated creatinine of 1.56. Multiple myeloma panel showed 0.6 g of IgG kappa light chain monoclonal protein. Free kappa light chain was elevated at 92.2 and kappa lambda light chain ratio was abnormal at 5.95. Iron studies and folate were normal. B12 levels were low at 224  Bone marrow biopsy showed 2% plasma cells with  kappa light chain restriction   Interval history: he is doing well. Denies any complaints at this time   Review of Systems  Constitutional: Negative for chills, fever, malaise/fatigue and weight loss.  HENT: Negative for congestion, ear discharge and nosebleeds.   Eyes: Negative for blurred vision.  Respiratory: Negative for cough, hemoptysis, sputum production, shortness of breath and wheezing.   Cardiovascular: Negative for chest pain, palpitations, orthopnea and claudication.  Gastrointestinal: Negative for abdominal pain, blood in stool, constipation, diarrhea, heartburn, melena, nausea and vomiting.  Genitourinary: Negative for dysuria, flank pain, frequency, hematuria and urgency.  Musculoskeletal: Negative for back pain, joint pain and myalgias.  Skin: Negative for rash.  Neurological: Negative for dizziness, tingling, focal weakness, seizures, weakness and headaches.  Endo/Heme/Allergies: Does not bruise/bleed easily.  Psychiatric/Behavioral: Negative for depression and suicidal ideas. The patient does not have insomnia.     No Known Allergies  Past Medical History:  Diagnosis Date  . Anxiety   . BPH (benign prostatic hyperplasia)   . GERD (gastroesophageal reflux disease)   . Hypercholesterolemia   . Hyperlipidemia   . Hypertension     Past Surgical History:  Procedure Laterality Date  . APPENDECTOMY    . COLONOSCOPY    . TONSILLECTOMY      Social History   Socioeconomic History  . Marital status: Married    Spouse name: Not on file  . Number of children: Not on file  . Years of education: Not on file  . Highest education level: Not on file  Occupational History  . Not on file  Tobacco Use  . Smoking status: Never  Smoker  . Smokeless tobacco: Never Used  Substance and Sexual Activity  . Alcohol use: Not on file  . Drug use: Not on file  . Sexual activity: Not on file  Other Topics Concern  . Not on file  Social History Narrative  . Not on file    Social Determinants of Health   Financial Resource Strain:   . Difficulty of Paying Living Expenses: Not on file  Food Insecurity:   . Worried About Charity fundraiser in the Last Year: Not on file  . Ran Out of Food in the Last Year: Not on file  Transportation Needs:   . Lack of Transportation (Medical): Not on file  . Lack of Transportation (Non-Medical): Not on file  Physical Activity:   . Days of Exercise per Week: Not on file  . Minutes of Exercise per Session: Not on file  Stress:   . Feeling of Stress : Not on file  Social Connections:   . Frequency of Communication with Friends and Family: Not on file  . Frequency of Social Gatherings with Friends and Family: Not on file  . Attends Religious Services: Not on file  . Active Member of Clubs or Organizations: Not on file  . Attends Archivist Meetings: Not on file  . Marital Status: Not on file  Intimate Partner Violence:   . Fear of Current or Ex-Partner: Not on file  . Emotionally Abused: Not on file  . Physically Abused: Not on file  . Sexually Abused: Not on file    Family History  Problem Relation Age of Onset  . Alzheimer's disease Father   . Alzheimer's disease Maternal Grandmother      Current Outpatient Medications:  .  Cyanocobalamin (VITAMIN B 12 PO), Take 1,000 mcg by mouth daily., Disp: , Rfl:  .  primidone (MYSOLINE) 50 MG tablet, TAKE 1 TABLET(50 MG) BY MOUTH EVERY NIGHT, Disp: , Rfl:  .  simvastatin (ZOCOR) 20 MG tablet, TAKE 1 TABLET(20 MG) BY MOUTH EVERY NIGHT, Disp: , Rfl:  .  tamsulosin (FLOMAX) 0.4 MG CAPS capsule, TAKE 1 CAPSULE BY MOUTH DAILY. TAKE 30 MINUTES AFTER SAME MEAL EACH DAY, Disp: , Rfl:   No results found.  No images are attached to the encounter.   CMP Latest Ref Rng & Units 05/28/2019  Glucose 70 - 99 mg/dL 106(H)  BUN 8 - 23 mg/dL 20  Creatinine 0.61 - 1.24 mg/dL 1.27(H)  Sodium 135 - 145 mmol/L 132(L)  Potassium 3.5 - 5.1 mmol/L 4.5  Chloride 98 - 111  mmol/L 99  CO2 22 - 32 mmol/L 27  Calcium 8.9 - 10.3 mg/dL 8.7(L)  Total Protein 6.5 - 8.1 g/dL 8.0  Total Bilirubin 0.3 - 1.2 mg/dL 0.7  Alkaline Phos 38 - 126 U/L 58  AST 15 - 41 U/L 20  ALT 0 - 44 U/L 21   CBC Latest Ref Rng & Units 05/28/2019  WBC 4.0 - 10.5 K/uL 5.7  Hemoglobin 13.0 - 17.0 g/dL 15.4  Hematocrit 39.0 - 52.0 % 46.5  Platelets 150 - 400 K/uL 182     Observation/objective: appears in no acute distress over video visit today. Breathing is non labored  Assessment and plan: Patient is a 74 year old male with IgG kappa MGUS here for routine f/u  I have reviewed labs from 05/28/19. Cbc is normal. Kidney functions are stable as well. Total protein and calcium are normal. M protein stable around 1gm and FLC ratio stable around  9. No need for bone marrow biopsy at this time. Continue to monitor  Follow-up instructions: cbc with diff, cmp, myeloma panel and FLC in 6 months and 1 year and I will see him in 1 year  I discussed the assessment and treatment plan with the patient. The patient was provided an opportunity to ask questions and all were answered. The patient agreed with the plan and demonstrated an understanding of the instructions.   The patient was advised to call back or seek an in-person evaluation if the symptoms worsen or if the condition fails to improve as anticipated.   Visit Diagnosis: 1. MGUS (monoclonal gammopathy of unknown significance)   2. Light chain disease, kappa type ([place])     Dr. Randa Evens, MD, MPH [facility] Tel- [phone] 06/09/2019 12:06 PM

## 2019-09-22 ENCOUNTER — Ambulatory Visit: Admit: 2019-09-22 | Payer: Medicare PPO | Admitting: Internal Medicine

## 2019-09-22 SURGERY — COLONOSCOPY WITH PROPOFOL
Anesthesia: General

## 2019-10-06 ENCOUNTER — Other Ambulatory Visit: Payer: Self-pay | Admitting: Family Medicine

## 2019-10-06 DIAGNOSIS — E7800 Pure hypercholesterolemia, unspecified: Secondary | ICD-10-CM

## 2019-10-06 DIAGNOSIS — R911 Solitary pulmonary nodule: Secondary | ICD-10-CM

## 2019-10-14 ENCOUNTER — Other Ambulatory Visit: Payer: Self-pay

## 2019-10-14 ENCOUNTER — Ambulatory Visit
Admission: RE | Admit: 2019-10-14 | Discharge: 2019-10-14 | Disposition: A | Discharge status: Home / Self Care | Payer: Medicare PPO | Source: Ambulatory Visit | Attending: Family Medicine | Admitting: Family Medicine

## 2019-10-14 DIAGNOSIS — R911 Solitary pulmonary nodule: Secondary | ICD-10-CM | POA: Insufficient documentation

## 2019-10-14 DIAGNOSIS — E7800 Pure hypercholesterolemia, unspecified: Secondary | ICD-10-CM | POA: Diagnosis present

## 2019-12-04 ENCOUNTER — Inpatient Hospital Stay: Payer: Medicare PPO | Attending: Hematology and Oncology

## 2019-12-04 ENCOUNTER — Other Ambulatory Visit: Payer: Self-pay

## 2019-12-04 ENCOUNTER — Other Ambulatory Visit: Payer: Medicare PPO

## 2019-12-04 DIAGNOSIS — D472 Monoclonal gammopathy: Secondary | ICD-10-CM | POA: Diagnosis not present

## 2019-12-04 DIAGNOSIS — D8989 Other specified disorders involving the immune mechanism, not elsewhere classified: Secondary | ICD-10-CM

## 2019-12-04 LAB — CBC WITH DIFFERENTIAL/PLATELET
Abs Immature Granulocytes: 0.02 10*3/uL (ref 0.00–0.07)
Basophils Absolute: 0 10*3/uL (ref 0.0–0.1)
Basophils Relative: 1 %
Eosinophils Absolute: 0.1 10*3/uL (ref 0.0–0.5)
Eosinophils Relative: 2 %
HCT: 46.6 % (ref 39.0–52.0)
Hemoglobin: 15.3 g/dL (ref 13.0–17.0)
Immature Granulocytes: 0 %
Lymphocytes Relative: 19 %
Lymphs Abs: 1.1 10*3/uL (ref 0.7–4.0)
MCH: 30.5 pg (ref 26.0–34.0)
MCHC: 32.8 g/dL (ref 30.0–36.0)
MCV: 93 fL (ref 80.0–100.0)
Monocytes Absolute: 0.8 10*3/uL (ref 0.1–1.0)
Monocytes Relative: 14 %
Neutro Abs: 3.8 10*3/uL (ref 1.7–7.7)
Neutrophils Relative %: 64 %
Platelets: 190 10*3/uL (ref 150–400)
RBC: 5.01 MIL/uL (ref 4.22–5.81)
RDW: 13 % (ref 11.5–15.5)
WBC: 6 10*3/uL (ref 4.0–10.5)
nRBC: 0 % (ref 0.0–0.2)

## 2019-12-04 LAB — COMPREHENSIVE METABOLIC PANEL
ALT: 22 U/L (ref 0–44)
AST: 24 U/L (ref 15–41)
Albumin: 4.1 g/dL (ref 3.5–5.0)
Alkaline Phosphatase: 66 U/L (ref 38–126)
Anion gap: 10 (ref 5–15)
BUN: 20 mg/dL (ref 8–23)
CO2: 23 mmol/L (ref 22–32)
Calcium: 8.9 mg/dL (ref 8.9–10.3)
Chloride: 98 mmol/L (ref 98–111)
Creatinine, Ser: 1.49 mg/dL — ABNORMAL HIGH (ref 0.61–1.24)
GFR calc Af Amer: 52 mL/min — ABNORMAL LOW (ref >60)
GFR calc non Af Amer: 45 mL/min — ABNORMAL LOW (ref >60)
Glucose, Bld: 105 mg/dL — ABNORMAL HIGH (ref 70–99)
Potassium: 4.6 mmol/L (ref 3.5–5.1)
Sodium: 131 mmol/L — ABNORMAL LOW (ref 135–145)
Total Bilirubin: 1.1 mg/dL (ref 0.3–1.2)
Total Protein: 7.7 g/dL (ref 6.5–8.1)

## 2019-12-05 LAB — KAPPA/LAMBDA LIGHT CHAINS
Kappa free light chain: 153.1 mg/L — ABNORMAL HIGH (ref 3.3–19.4)
Kappa, lambda light chain ratio: 11.78 — ABNORMAL HIGH (ref 0.26–1.65)
Lambda free light chains: 13 mg/L (ref 5.7–26.3)

## 2019-12-08 LAB — MULTIPLE MYELOMA PANEL, SERUM
Albumin SerPl Elph-Mcnc: 3.8 g/dL (ref 2.9–4.4)
Albumin/Glob SerPl: 1.1 (ref 0.7–1.7)
Alpha 1: 0.2 g/dL (ref 0.0–0.4)
Alpha2 Glob SerPl Elph-Mcnc: 0.7 g/dL (ref 0.4–1.0)
B-Globulin SerPl Elph-Mcnc: 0.8 g/dL (ref 0.7–1.3)
Gamma Glob SerPl Elph-Mcnc: 1.9 g/dL — ABNORMAL HIGH (ref 0.4–1.8)
Globulin, Total: 3.6 g/dL (ref 2.2–3.9)
IgA: 87 mg/dL (ref 61–437)
IgG (Immunoglobin G), Serum: 1819 mg/dL — ABNORMAL HIGH (ref 603–1613)
IgM (Immunoglobulin M), Srm: 22 mg/dL (ref 15–143)
M Protein SerPl Elph-Mcnc: 1.3 g/dL — ABNORMAL HIGH
Total Protein ELP: 7.4 g/dL (ref 6.0–8.5)

## 2019-12-11 ENCOUNTER — Encounter: Payer: Self-pay | Admitting: Oncology

## 2019-12-31 ENCOUNTER — Encounter: Payer: Self-pay | Admitting: Oncology

## 2020-05-25 ENCOUNTER — Other Ambulatory Visit: Payer: Self-pay

## 2020-05-25 DIAGNOSIS — N1831 Chronic kidney disease, stage 3a: Secondary | ICD-10-CM

## 2020-05-25 DIAGNOSIS — D472 Monoclonal gammopathy: Secondary | ICD-10-CM

## 2020-05-25 DIAGNOSIS — R809 Proteinuria, unspecified: Secondary | ICD-10-CM

## 2020-06-03 ENCOUNTER — Other Ambulatory Visit: Payer: Self-pay

## 2020-06-03 ENCOUNTER — Inpatient Hospital Stay: Payer: Medicare PPO | Attending: Hematology and Oncology

## 2020-06-03 DIAGNOSIS — D8989 Other specified disorders involving the immune mechanism, not elsewhere classified: Secondary | ICD-10-CM

## 2020-06-03 DIAGNOSIS — N1831 Chronic kidney disease, stage 3a: Secondary | ICD-10-CM

## 2020-06-03 DIAGNOSIS — D472 Monoclonal gammopathy: Secondary | ICD-10-CM | POA: Insufficient documentation

## 2020-06-03 DIAGNOSIS — E7800 Pure hypercholesterolemia, unspecified: Secondary | ICD-10-CM | POA: Diagnosis not present

## 2020-06-03 DIAGNOSIS — N400 Enlarged prostate without lower urinary tract symptoms: Secondary | ICD-10-CM | POA: Diagnosis not present

## 2020-06-03 DIAGNOSIS — Z833 Family history of diabetes mellitus: Secondary | ICD-10-CM | POA: Insufficient documentation

## 2020-06-03 DIAGNOSIS — D649 Anemia, unspecified: Secondary | ICD-10-CM | POA: Diagnosis not present

## 2020-06-03 DIAGNOSIS — N183 Chronic kidney disease, stage 3 unspecified: Secondary | ICD-10-CM | POA: Insufficient documentation

## 2020-06-03 DIAGNOSIS — K219 Gastro-esophageal reflux disease without esophagitis: Secondary | ICD-10-CM | POA: Insufficient documentation

## 2020-06-03 DIAGNOSIS — Z87891 Personal history of nicotine dependence: Secondary | ICD-10-CM | POA: Diagnosis not present

## 2020-06-03 DIAGNOSIS — I10 Essential (primary) hypertension: Secondary | ICD-10-CM | POA: Diagnosis not present

## 2020-06-03 DIAGNOSIS — Z79899 Other long term (current) drug therapy: Secondary | ICD-10-CM | POA: Diagnosis not present

## 2020-06-03 DIAGNOSIS — R809 Proteinuria, unspecified: Secondary | ICD-10-CM

## 2020-06-03 DIAGNOSIS — E785 Hyperlipidemia, unspecified: Secondary | ICD-10-CM | POA: Diagnosis not present

## 2020-06-03 LAB — CBC WITH DIFFERENTIAL/PLATELET
Abs Immature Granulocytes: 0.03 10*3/uL (ref 0.00–0.07)
Basophils Absolute: 0 10*3/uL (ref 0.0–0.1)
Basophils Relative: 1 %
Eosinophils Absolute: 0.1 10*3/uL (ref 0.0–0.5)
Eosinophils Relative: 2 %
HCT: 46.3 % (ref 39.0–52.0)
Hemoglobin: 15.1 g/dL (ref 13.0–17.0)
Immature Granulocytes: 1 %
Lymphocytes Relative: 19 %
Lymphs Abs: 1.3 10*3/uL (ref 0.7–4.0)
MCH: 30.4 pg (ref 26.0–34.0)
MCHC: 32.6 g/dL (ref 30.0–36.0)
MCV: 93.3 fL (ref 80.0–100.0)
Monocytes Absolute: 0.8 10*3/uL (ref 0.1–1.0)
Monocytes Relative: 13 %
Neutro Abs: 4.2 10*3/uL (ref 1.7–7.7)
Neutrophils Relative %: 64 %
Platelets: 185 10*3/uL (ref 150–400)
RBC: 4.96 MIL/uL (ref 4.22–5.81)
RDW: 13 % (ref 11.5–15.5)
WBC: 6.5 10*3/uL (ref 4.0–10.5)
nRBC: 0 % (ref 0.0–0.2)

## 2020-06-03 LAB — COMPREHENSIVE METABOLIC PANEL
ALT: 21 U/L (ref 0–44)
AST: 21 U/L (ref 15–41)
Albumin: 4.3 g/dL (ref 3.5–5.0)
Alkaline Phosphatase: 59 U/L (ref 38–126)
Anion gap: 9 (ref 5–15)
BUN: 26 mg/dL — ABNORMAL HIGH (ref 8–23)
CO2: 25 mmol/L (ref 22–32)
Calcium: 8.5 mg/dL — ABNORMAL LOW (ref 8.9–10.3)
Chloride: 93 mmol/L — ABNORMAL LOW (ref 98–111)
Creatinine, Ser: 1.44 mg/dL — ABNORMAL HIGH (ref 0.61–1.24)
GFR, Estimated: 50 mL/min — ABNORMAL LOW (ref >60)
Glucose, Bld: 101 mg/dL — ABNORMAL HIGH (ref 70–99)
Potassium: 4 mmol/L (ref 3.5–5.1)
Sodium: 127 mmol/L — ABNORMAL LOW (ref 135–145)
Total Bilirubin: 0.9 mg/dL (ref 0.3–1.2)
Total Protein: 8.2 g/dL — ABNORMAL HIGH (ref 6.5–8.1)

## 2020-06-04 ENCOUNTER — Other Ambulatory Visit: Payer: Medicare PPO

## 2020-06-04 LAB — MICROALBUMIN / CREATININE URINE RATIO
Creatinine, Urine: 94.6 mg/dL
Microalb Creat Ratio: 12 mg/g creat (ref 0–29)
Microalb, Ur: 11.3 ug/mL — ABNORMAL HIGH

## 2020-06-04 LAB — PARATHYROID HORMONE, INTACT (NO CA): PTH: 29 pg/mL (ref 15–65)

## 2020-06-04 LAB — KAPPA/LAMBDA LIGHT CHAINS
Kappa free light chain: 135.4 mg/L — ABNORMAL HIGH (ref 3.3–19.4)
Kappa, lambda light chain ratio: 10.42 — ABNORMAL HIGH (ref 0.26–1.65)
Lambda free light chains: 13 mg/L (ref 5.7–26.3)

## 2020-06-07 LAB — MULTIPLE MYELOMA PANEL, SERUM
Albumin SerPl Elph-Mcnc: 4 g/dL (ref 2.9–4.4)
Albumin/Glob SerPl: 1.2 (ref 0.7–1.7)
Alpha 1: 0.2 g/dL (ref 0.0–0.4)
Alpha2 Glob SerPl Elph-Mcnc: 0.6 g/dL (ref 0.4–1.0)
B-Globulin SerPl Elph-Mcnc: 0.7 g/dL (ref 0.7–1.3)
Gamma Glob SerPl Elph-Mcnc: 1.8 g/dL (ref 0.4–1.8)
Globulin, Total: 3.4 g/dL (ref 2.2–3.9)
IgA: 78 mg/dL (ref 61–437)
IgG (Immunoglobin G), Serum: 1914 mg/dL — ABNORMAL HIGH (ref 603–1613)
IgM (Immunoglobulin M), Srm: 22 mg/dL (ref 15–143)
M Protein SerPl Elph-Mcnc: 1.2 g/dL — ABNORMAL HIGH
Total Protein ELP: 7.4 g/dL (ref 6.0–8.5)

## 2020-06-11 ENCOUNTER — Inpatient Hospital Stay: Payer: Medicare PPO | Admitting: Oncology

## 2020-06-11 ENCOUNTER — Telehealth: Payer: Self-pay | Admitting: Oncology

## 2020-06-11 ENCOUNTER — Encounter: Payer: Self-pay | Admitting: Oncology

## 2020-06-11 ENCOUNTER — Other Ambulatory Visit: Payer: Self-pay

## 2020-06-11 DIAGNOSIS — D472 Monoclonal gammopathy: Secondary | ICD-10-CM | POA: Diagnosis not present

## 2020-06-11 NOTE — Telephone Encounter (Signed)
Call placed to pt to notify him of f/u appts, pt's wife took notes.

## 2020-06-11 NOTE — Progress Notes (Signed)
Pt been doing good. He just got the message from our office and started the lquids intake yest. He is drinking 40 oz of gatorade and 10 oz coffee and 10 oz of water. He has noticed some bruising easy. He has spot on right hand that he hit hand on cabinet and got bruised.

## 2020-06-18 NOTE — Progress Notes (Signed)
Hematology/Oncology Consult note [facility]  [phone] [phone]  Patient Care Team: Sofie Hartigan, MD as PCP - General (Family Medicine)   Name of the patient: Steve Warren  [id number]  02-Jun-1945   Date of visit: 06/18/20  Diagnosis-IgG kappa MGUS  Chief complaint/ Reason for visit-routine follow-up of IgG kappa MGUS  Heme/Onc history: patient is 75 year old male who was recently seen by rheumatology for positive ANA. He has a history of stage III chronic kidney disease, hypertension, hyperlipidemia and GERD. He has been referred to [place] for abnormal SPEP. His most recent labs from 11/07/2017 showed white count of 8.3, H&H of 12.8/38 with an MCV of 93.4 and a platelet count of 224. LFTs were within normal limits. Serum calcium was 9.3. Serum creatinine was elevated at 1.58. As a part of work-up for chronic kidney disease patient had an SPEP done which showed abnormal protein in the gammaglobulin and a possible second abnormal protein which may represent monoclonal immunoglobulins. Recent CBC from 12/20/2017 showed H&H of 14.5/44.5.  Results of blood work from 01/07/2018 were as follows: CBC showed white count of 6.8, H&H of 14.5/42.4 and a platelet count of 196. CMP was significant for elevated creatinine of 1.56. Multiple myeloma panel showed 0.6 g of IgG kappa light chain monoclonal protein. Free kappa light chain was elevated at 92.2 and kappa lambda light chain ratio was abnormal at 5.95. Iron studies and folate were normal. B12 levels were low at 224  Bone marrow biopsy showed 2%plasmacells with kappa light chain restriction    Interval history-patient feels well overall.  He had some recent bruising in his right hand.  He is keeping up with his fluid intake as well as electrolyte intake after he was told by his nephrology office about his low sodium. ECOG PS- 1 Pain scale- 0   Review of systems- Review of  Systems  Constitutional: Negative for chills, fever, malaise/fatigue and weight loss.  HENT: Negative for congestion, ear discharge and nosebleeds.   Eyes: Negative for blurred vision.  Respiratory: Negative for cough, hemoptysis, sputum production, shortness of breath and wheezing.   Cardiovascular: Negative for chest pain, palpitations, orthopnea and claudication.  Gastrointestinal: Negative for abdominal pain, blood in stool, constipation, diarrhea, heartburn, melena, nausea and vomiting.  Genitourinary: Negative for dysuria, flank pain, frequency, hematuria and urgency.  Musculoskeletal: Negative for back pain, joint pain and myalgias.  Skin: Negative for rash.  Neurological: Negative for dizziness, tingling, focal weakness, seizures, weakness and headaches.  Endo/Heme/Allergies: Does not bruise/bleed easily.  Psychiatric/Behavioral: Negative for depression and suicidal ideas. The patient does not have insomnia.       No Known Allergies   Past Medical History:  Diagnosis Date  . Anxiety   . BPH (benign prostatic hyperplasia)   . Cataract    bilateral- Dec 2021 and jan 2022  . GERD (gastroesophageal reflux disease)   . Hypercholesterolemia   . Hyperlipidemia   . Hypertension      Past Surgical History:  Procedure Laterality Date  . APPENDECTOMY    . CATARACT EXTRACTION, BILATERAL    . COLONOSCOPY    . TONSILLECTOMY      Social History   Socioeconomic History  . Marital status: Married    Spouse name: Not on file  . Number of children: Not on file  . Years of education: Not on file  . Highest education level: Not on file  Occupational History  . Not on file  Tobacco Use  .  Smoking status: Former Smoker    Quit date: 2012    Years since quitting: 10.1  . Smokeless tobacco: Never Used  Vaping Use  . Vaping Use: Never used  Substance and Sexual Activity  . Alcohol use: Yes    Comment: once a month liquor  . Drug use: Never  . Sexual activity: Not Currently   Other Topics Concern  . Not on file  Social History Narrative  . Not on file   Social Determinants of Health   Financial Resource Strain: Not on file  Food Insecurity: Not on file  Transportation Needs: Not on file  Physical Activity: Not on file  Stress: Not on file  Social Connections: Not on file  Intimate Partner Violence: Not on file    Family History  Problem Relation Age of Onset  . Alzheimer's disease Father   . Diabetes Maternal Grandmother      Current Outpatient Medications:  .  Cyanocobalamin (VITAMIN B 12 PO), Take 1,000 mcg by mouth daily., Disp: , Rfl:  .  primidone (MYSOLINE) 50 MG tablet, TAKE 1 TABLET(50 MG) BY MOUTH EVERY NIGHT, Disp: , Rfl:  .  Propylene Glycol (SYSTANE COMPLETE OP), Apply 1 drop to eye in the morning, at noon, in the evening, and at bedtime., Disp: , Rfl:  .  simvastatin (ZOCOR) 20 MG tablet, TAKE 1 TABLET(20 MG) BY MOUTH EVERY NIGHT, Disp: , Rfl:  .  tamsulosin (FLOMAX) 0.4 MG CAPS capsule, TAKE 1 CAPSULE BY MOUTH DAILY. TAKE 30 MINUTES AFTER SAME MEAL EACH DAY, Disp: , Rfl:   Physical exam:  Vitals:   06/11/20 1113  BP: (!) 155/76  Pulse: 69  Temp: 98.1 F (36.7 C)  TempSrc: Tympanic  SpO2: 100%  Weight: 213 lb 9.6 oz (96.9 kg)   Physical Exam Constitutional:      General: He is not in acute distress. Eyes:     Extraocular Movements: EOM normal.  Cardiovascular:     Rate and Rhythm: Normal rate and regular rhythm.     Heart sounds: Normal heart sounds.  Pulmonary:     Effort: Pulmonary effort is normal.     Breath sounds: Normal breath sounds.  Skin:    General: Skin is warm and dry.  Neurological:     Mental Status: He is alert and oriented to person, place, and time.      CMP Latest Ref Rng & Units 06/03/2020  Glucose 70 - 99 mg/dL 101(H)  BUN 8 - 23 mg/dL 26(H)  Creatinine 0.61 - 1.24 mg/dL 1.44(H)  Sodium 135 - 145 mmol/L 127(L)  Potassium 3.5 - 5.1 mmol/L 4.0  Chloride 98 - 111 mmol/L 93(L)  CO2 22 - 32  mmol/L 25  Calcium 8.9 - 10.3 mg/dL 8.5(L)  Total Protein 6.5 - 8.1 g/dL 8.2(H)  Total Bilirubin 0.3 - 1.2 mg/dL 0.9  Alkaline Phos 38 - 126 U/L 59  AST 15 - 41 U/L 21  ALT 0 - 44 U/L 21   CBC Latest Ref Rng & Units 06/03/2020  WBC 4.0 - 10.5 K/uL 6.5  Hemoglobin 13.0 - 17.0 g/dL 15.1  Hematocrit 39.0 - 52.0 % 46.3  Platelets 150 - 400 K/uL 185     Assessment and plan- Patient is a 75 y.o. male with history of IgG kappa MGUS here for routine follow-up  Patient CBC shows H&H of 15.1/26.3 which is consistent with his prior values.  He has baseline CKD and his creatinine has been stable around 1.4.  Calcium borderline  low at 8.5.  Myeloma panel shows IgG level of 1914 which is gradually trending up over the last 2 years with an M protein of 1.2 which is increased as compared to 0.62 years ago.  Serum free light chain ratio has increased from 5-10 over the last 2 years but has remained stable over the last 1 year.  Given that his anemia and renal functions are stable I am inclined to monitor these values conservatively at this time without the need for a repeat bone marrow biopsy.  I will get CBC with differential CMP myeloma panel and serum free light chains in 6 months in 1 year and see him back in 1 year.  We will get an updated bone survey for him since his last bone survey was in July 2020.   Visit Diagnosis 1. MGUS (monoclonal gammopathy of unknown significance)      Dr. Randa Evens, MD, MPH [facility] [phone] 06/18/2020 1:08 PM

## 2020-11-13 ENCOUNTER — Emergency Department
Admission: EM | Admit: 2020-11-13 | Discharge: 2020-11-13 | Disposition: A | Discharge status: Home / Self Care | Payer: Medicare PPO | Source: Home / Self Care

## 2020-11-13 ENCOUNTER — Other Ambulatory Visit: Payer: Self-pay

## 2020-11-13 ENCOUNTER — Emergency Department: Payer: Medicare PPO

## 2020-11-13 DIAGNOSIS — R0789 Other chest pain: Secondary | ICD-10-CM | POA: Diagnosis not present

## 2020-11-13 DIAGNOSIS — R079 Chest pain, unspecified: Secondary | ICD-10-CM | POA: Insufficient documentation

## 2020-11-13 DIAGNOSIS — R0602 Shortness of breath: Secondary | ICD-10-CM | POA: Insufficient documentation

## 2020-11-13 DIAGNOSIS — Z5321 Procedure and treatment not carried out due to patient leaving prior to being seen by health care provider: Secondary | ICD-10-CM | POA: Insufficient documentation

## 2020-11-13 LAB — CBC
HCT: 45.4 % (ref 39.0–52.0)
Hemoglobin: 14.9 g/dL (ref 13.0–17.0)
MCH: 31.2 pg (ref 26.0–34.0)
MCHC: 32.8 g/dL (ref 30.0–36.0)
MCV: 95.2 fL (ref 80.0–100.0)
Platelets: 163 10*3/uL (ref 150–400)
RBC: 4.77 MIL/uL (ref 4.22–5.81)
RDW: 13 % (ref 11.5–15.5)
WBC: 6.1 10*3/uL (ref 4.0–10.5)
nRBC: 0 % (ref 0.0–0.2)

## 2020-11-13 LAB — BASIC METABOLIC PANEL
Anion gap: 6 (ref 5–15)
BUN: 20 mg/dL (ref 8–23)
CO2: 28 mmol/L (ref 22–32)
Calcium: 8.8 mg/dL — ABNORMAL LOW (ref 8.9–10.3)
Chloride: 102 mmol/L (ref 98–111)
Creatinine, Ser: 1.21 mg/dL (ref 0.61–1.24)
GFR, Estimated: >60 mL/min (ref >60)
Glucose, Bld: 114 mg/dL — ABNORMAL HIGH (ref 70–99)
Potassium: 5.3 mmol/L — ABNORMAL HIGH (ref 3.5–5.1)
Sodium: 136 mmol/L (ref 135–145)

## 2020-11-13 LAB — TROPONIN I (HIGH SENSITIVITY)
Troponin I (High Sensitivity): 8 ng/L (ref <18)
Troponin I (High Sensitivity): 14 ng/L (ref <18)

## 2020-11-13 NOTE — ED Notes (Signed)
Pt ambulatory to desk, states that he does not want to stay at this time.  Pt encouraged to wait for MD evaluation, MSE waiver again explained to pt who verbalizes understanding.  Labs reviewed by this RN, no critical values noted.  Pt states he is leaving at this time and will return for return in symptoms.

## 2020-11-13 NOTE — ED Triage Notes (Signed)
Pt states he was woken up frmo his sleep with central chest pain tonight. Pt states he had same pain yesterday but it went away. Pt feel sob with pain, pain goes through to his back. Pt states no cardiac hx.

## 2020-11-15 ENCOUNTER — Inpatient Hospital Stay
Admission: EM | Admit: 2020-11-15 | Discharge: 2020-11-18 | DRG: 313 | Disposition: A | Discharge status: Home / Self Care | Payer: Medicare PPO | Attending: Internal Medicine | Admitting: Internal Medicine

## 2020-11-15 ENCOUNTER — Emergency Department: Payer: Medicare PPO

## 2020-11-15 ENCOUNTER — Other Ambulatory Visit: Payer: Self-pay

## 2020-11-15 DIAGNOSIS — E785 Hyperlipidemia, unspecified: Secondary | ICD-10-CM | POA: Diagnosis present

## 2020-11-15 DIAGNOSIS — E782 Mixed hyperlipidemia: Secondary | ICD-10-CM | POA: Diagnosis not present

## 2020-11-15 DIAGNOSIS — R809 Proteinuria, unspecified: Secondary | ICD-10-CM | POA: Diagnosis present

## 2020-11-15 DIAGNOSIS — Z20822 Contact with and (suspected) exposure to covid-19: Secondary | ICD-10-CM | POA: Diagnosis present

## 2020-11-15 DIAGNOSIS — G250 Essential tremor: Secondary | ICD-10-CM | POA: Diagnosis present

## 2020-11-15 DIAGNOSIS — R079 Chest pain, unspecified: Secondary | ICD-10-CM | POA: Diagnosis not present

## 2020-11-15 DIAGNOSIS — N183 Chronic kidney disease, stage 3 unspecified: Secondary | ICD-10-CM | POA: Diagnosis present

## 2020-11-15 DIAGNOSIS — N400 Enlarged prostate without lower urinary tract symptoms: Secondary | ICD-10-CM | POA: Diagnosis present

## 2020-11-15 DIAGNOSIS — Z87891 Personal history of nicotine dependence: Secondary | ICD-10-CM

## 2020-11-15 DIAGNOSIS — N401 Enlarged prostate with lower urinary tract symptoms: Secondary | ICD-10-CM | POA: Diagnosis not present

## 2020-11-15 DIAGNOSIS — N1831 Chronic kidney disease, stage 3a: Secondary | ICD-10-CM | POA: Diagnosis present

## 2020-11-15 DIAGNOSIS — I10 Essential (primary) hypertension: Secondary | ICD-10-CM

## 2020-11-15 DIAGNOSIS — R918 Other nonspecific abnormal finding of lung field: Secondary | ICD-10-CM

## 2020-11-15 DIAGNOSIS — R778 Other specified abnormalities of plasma proteins: Secondary | ICD-10-CM | POA: Diagnosis present

## 2020-11-15 DIAGNOSIS — I700 Atherosclerosis of aorta: Secondary | ICD-10-CM | POA: Diagnosis present

## 2020-11-15 DIAGNOSIS — K219 Gastro-esophageal reflux disease without esophagitis: Secondary | ICD-10-CM | POA: Diagnosis present

## 2020-11-15 DIAGNOSIS — E7800 Pure hypercholesterolemia, unspecified: Secondary | ICD-10-CM | POA: Diagnosis present

## 2020-11-15 DIAGNOSIS — I429 Cardiomyopathy, unspecified: Secondary | ICD-10-CM

## 2020-11-15 DIAGNOSIS — I2510 Atherosclerotic heart disease of native coronary artery without angina pectoris: Secondary | ICD-10-CM | POA: Diagnosis present

## 2020-11-15 DIAGNOSIS — R7989 Other specified abnormal findings of blood chemistry: Secondary | ICD-10-CM | POA: Diagnosis present

## 2020-11-15 DIAGNOSIS — Z79899 Other long term (current) drug therapy: Secondary | ICD-10-CM

## 2020-11-15 DIAGNOSIS — R42 Dizziness and giddiness: Secondary | ICD-10-CM | POA: Diagnosis present

## 2020-11-15 DIAGNOSIS — Z820 Family history of epilepsy and other diseases of the nervous system: Secondary | ICD-10-CM

## 2020-11-15 DIAGNOSIS — R911 Solitary pulmonary nodule: Secondary | ICD-10-CM | POA: Diagnosis present

## 2020-11-15 DIAGNOSIS — I130 Hypertensive heart and chronic kidney disease with heart failure and stage 1 through stage 4 chronic kidney disease, or unspecified chronic kidney disease: Secondary | ICD-10-CM | POA: Diagnosis present

## 2020-11-15 DIAGNOSIS — R350 Frequency of micturition: Secondary | ICD-10-CM

## 2020-11-15 DIAGNOSIS — R0789 Other chest pain: Principal | ICD-10-CM | POA: Diagnosis present

## 2020-11-15 DIAGNOSIS — I5032 Chronic diastolic (congestive) heart failure: Secondary | ICD-10-CM | POA: Diagnosis present

## 2020-11-15 DIAGNOSIS — D472 Monoclonal gammopathy: Secondary | ICD-10-CM | POA: Diagnosis present

## 2020-11-15 DIAGNOSIS — Z833 Family history of diabetes mellitus: Secondary | ICD-10-CM

## 2020-11-15 DIAGNOSIS — I214 Non-ST elevation (NSTEMI) myocardial infarction: Secondary | ICD-10-CM | POA: Diagnosis present

## 2020-11-15 DIAGNOSIS — R251 Tremor, unspecified: Secondary | ICD-10-CM | POA: Diagnosis present

## 2020-11-15 LAB — RESP PANEL BY RT-PCR (FLU A&B, COVID) ARPGX2
Influenza A by PCR: NEGATIVE
Influenza B by PCR: NEGATIVE
SARS Coronavirus 2 by RT PCR: NEGATIVE

## 2020-11-15 LAB — CBC
HCT: 48.1 % (ref 39.0–52.0)
Hemoglobin: 15.7 g/dL (ref 13.0–17.0)
MCH: 31.2 pg (ref 26.0–34.0)
MCHC: 32.6 g/dL (ref 30.0–36.0)
MCV: 95.6 fL (ref 80.0–100.0)
Platelets: 191 10*3/uL (ref 150–400)
RBC: 5.03 MIL/uL (ref 4.22–5.81)
RDW: 13.1 % (ref 11.5–15.5)
WBC: 6.6 10*3/uL (ref 4.0–10.5)
nRBC: 0 % (ref 0.0–0.2)

## 2020-11-15 LAB — BASIC METABOLIC PANEL
Anion gap: 4 — ABNORMAL LOW (ref 5–15)
BUN: 21 mg/dL (ref 8–23)
CO2: 30 mmol/L (ref 22–32)
Calcium: 10 mg/dL (ref 8.9–10.3)
Chloride: 100 mmol/L (ref 98–111)
Creatinine, Ser: 1.47 mg/dL — ABNORMAL HIGH (ref 0.61–1.24)
GFR, Estimated: 49 mL/min — ABNORMAL LOW (ref >60)
Glucose, Bld: 100 mg/dL — ABNORMAL HIGH (ref 70–99)
Potassium: 4.1 mmol/L (ref 3.5–5.1)
Sodium: 134 mmol/L — ABNORMAL LOW (ref 135–145)

## 2020-11-15 LAB — BRAIN NATRIURETIC PEPTIDE: B Natriuretic Peptide: 112.5 pg/mL — ABNORMAL HIGH (ref 0.0–100.0)

## 2020-11-15 LAB — TROPONIN I (HIGH SENSITIVITY)
Troponin I (High Sensitivity): 28 ng/L — ABNORMAL HIGH (ref <18)
Troponin I (High Sensitivity): 50 ng/L — ABNORMAL HIGH (ref <18)
Troponin I (High Sensitivity): 86 ng/L — ABNORMAL HIGH (ref <18)

## 2020-11-15 LAB — PROTIME-INR
INR: 1 (ref 0.8–1.2)
Prothrombin Time: 13.3 seconds (ref 11.4–15.2)

## 2020-11-15 LAB — APTT: aPTT: 33 seconds (ref 24–36)

## 2020-11-15 MED ORDER — HEPARIN BOLUS VIA INFUSION
4000.0000 [IU] | Freq: Once | INTRAVENOUS | Status: AC
  Administered 2020-11-15: 4000 [IU] via INTRAVENOUS
  Filled 2020-11-15: qty 4000

## 2020-11-15 MED ORDER — ASPIRIN EC 81 MG PO TBEC
81.0000 mg | DELAYED_RELEASE_TABLET | Freq: Every day | ORAL | Status: DC
  Administered 2020-11-16 – 2020-11-18 (×3): 81 mg via ORAL
  Filled 2020-11-15 (×3): qty 1

## 2020-11-15 MED ORDER — PRIMIDONE 50 MG PO TABS: 50.0000 mg | ORAL_TABLET | Freq: Every day | ORAL | Status: DC

## 2020-11-15 MED ORDER — TAMSULOSIN HCL 0.4 MG PO CAPS
0.4000 mg | ORAL_CAPSULE | Freq: Every day | ORAL | Status: DC
  Administered 2020-11-16 – 2020-11-18 (×3): 0.4 mg via ORAL
  Filled 2020-11-15 (×3): qty 1

## 2020-11-15 MED ORDER — VITAMIN B-12 1000 MCG PO TABS
1000.0000 ug | ORAL_TABLET | Freq: Every day | ORAL | Status: DC
  Administered 2020-11-16 – 2020-11-18 (×3): 1000 ug via ORAL
  Filled 2020-11-15 (×3): qty 1

## 2020-11-15 MED ORDER — HEPARIN (PORCINE) 25000 UT/250ML-% IV SOLN
1050.0000 [IU]/h | INTRAVENOUS | Status: DC
  Administered 2020-11-15: 1300 [IU]/h via INTRAVENOUS
  Administered 2020-11-16: 1050 [IU]/h via INTRAVENOUS
  Filled 2020-11-15 (×2): qty 250

## 2020-11-15 MED ORDER — PRIMIDONE 50 MG PO TABS
50.0000 mg | ORAL_TABLET | Freq: Every morning | ORAL | Status: DC
  Administered 2020-11-16 – 2020-11-18 (×3): 50 mg via ORAL
  Filled 2020-11-15 (×3): qty 1

## 2020-11-15 MED ORDER — MORPHINE SULFATE (PF) 2 MG/ML IV SOLN: 2.0000 mg | INTRAVENOUS | Status: DC | PRN

## 2020-11-15 MED ORDER — IOHEXOL 350 MG/ML SOLN
100.0000 mL | Freq: Once | INTRAVENOUS | Status: AC | PRN
  Administered 2020-11-15: 100 mL via INTRAVENOUS

## 2020-11-15 MED ORDER — NITROGLYCERIN 2 % TD OINT
1.0000 [in_us] | TOPICAL_OINTMENT | Freq: Four times a day (QID) | TRANSDERMAL | Status: DC | PRN
Start: 2020-11-15 — End: 2020-11-17

## 2020-11-15 MED ORDER — ACETAMINOPHEN 325 MG PO TABS: 650.0000 mg | ORAL_TABLET | ORAL | Status: DC | PRN

## 2020-11-15 MED ORDER — SIMVASTATIN 20 MG PO TABS
20.0000 mg | ORAL_TABLET | Freq: Every day | ORAL | Status: DC
  Administered 2020-11-15 – 2020-11-16 (×2): 20 mg via ORAL
  Filled 2020-11-15: qty 1
  Filled 2020-11-15: qty 2

## 2020-11-15 MED ORDER — ONDANSETRON HCL 4 MG/2ML IJ SOLN: 4.0000 mg | Freq: Four times a day (QID) | INTRAMUSCULAR | Status: DC | PRN

## 2020-11-15 MED ORDER — ASPIRIN 81 MG PO CHEW
243.0000 mg | CHEWABLE_TABLET | Freq: Once | ORAL | Status: AC
  Administered 2020-11-15: 243 mg via ORAL
  Filled 2020-11-15: qty 3

## 2020-11-15 NOTE — ED Triage Notes (Signed)
Pt states chest pain since 6/30 and that his neighbor who is a provider stated he thinks he should have a CT scan. Pt states the pain comes in waves and when it hits he gets short of breath. Pt states the pain radiates to the back. Pt states the pain is sharp in nature.

## 2020-11-15 NOTE — ED Notes (Addendum)
Troponin increased from 50 to 86, MD notified and aware

## 2020-11-15 NOTE — ED Notes (Signed)
MD at the bedside  

## 2020-11-15 NOTE — Progress Notes (Signed)
ANTICOAGULATION CONSULT NOTE - Initial Consult  Pharmacy Consult for Heparin  Indication: chest pain/ACS  No Known Allergies  Patient Measurements: Height: 5\' 11"  (180.3 cm) Weight: 95.3 kg (210 lb) IBW/kg (Calculated) : 75.3 Heparin Dosing Weight: 94.5 kg   Vital Signs: Temp: 99 F (37.2 C) (07/04 1747) BP: 149/87 (07/04 2200) Pulse Rate: 67 (07/04 2200)  Labs: Recent Labs    11/13/20 0319 11/13/20 0625 11/15/20 1755 11/15/20 2035  HGB 14.9  --  15.7  --   HCT 45.4  --  48.1  --   PLT 163  --  191  --   CREATININE 1.21  --  1.47*  --   TROPONINIHS 8 14 28* 50*    Estimated Creatinine Clearance: 50.4 mL/min (A) (by C-G formula based on SCr of 1.47 mg/dL (H)).   Medical History: Past Medical History:  Diagnosis Date   Anxiety    BPH (benign prostatic hyperplasia)    Cataract    bilateral- Dec 2021 and jan 2022   GERD (gastroesophageal reflux disease)    Hypercholesterolemia    Hyperlipidemia    Hypertension     Medications:  (Not in a hospital admission)   Assessment: Pharmacy consulted to dose heparin in this 75 year old male admitted with ACS/NSTEMI.  No prior anticoag noted. CrCl = 50.4 ml/min   Goal of Therapy:  Heparin level 0.3-0.7 units/ml Monitor platelets by anticoagulation protocol: Yes   Plan:  Give 4000 units bolus x 1 Start heparin infusion at 1300 units/hr Check anti-Xa level in 8 hours and daily while on heparin Continue to monitor H&H and platelets  Sahasra Belue D 11/15/2020,10:36 PM

## 2020-11-15 NOTE — ED Notes (Signed)
Patient in CT

## 2020-11-15 NOTE — ED Provider Notes (Signed)
[facility] Emergency Department Provider Note    ____________________________________________   I have reviewed the triage vital signs and the nursing notes.   HISTORY  Chief Complaint Chest Pain   History limited by: Not Limited   HPI Steve Warren is a 75 y.o. male who presents to the emergency department today because of concern for chest pain. The patient states that it started a few days ago. It has been intermittent. Located in the central/left chest. Described as sharp. Did radiate to his back. The patient did feel it was worse or brought on by exertion. The patient denies any significant associated shortness of breath. Did come to the emergency department 2 days ago for this issue but left prior to being seen. Denies any recent travel. No extremity swelling or discomfort.    Records reviewed. Per medical record review patient has a history of HLD, HTN.   Past Medical History:  Diagnosis Date   Anxiety    BPH (benign prostatic hyperplasia)    Cataract    bilateral- Dec 2021 and jan 2022   GERD (gastroesophageal reflux disease)    Hypercholesterolemia    Hyperlipidemia    Hypertension     Patient Active Problem List   Diagnosis Date Noted   Acute kidney failure ([place]) 04/03/2019   Proteinuria 04/03/2019   Pure hypercholesterolemia 11/18/2018   MGUS (monoclonal gammopathy of unknown significance) 02/18/2018   Other specified abnormal immunological findings in serum 12/20/2017   Stage 3 chronic kidney disease 11/05/2017   Lung nodule seen on imaging study 05/24/2017   BPH (benign prostatic hyperplasia) 09/18/2013   Gastroesophageal reflux disease without esophagitis 09/18/2013   Hyperlipidemia 09/18/2013   Tremors of nervous system 09/18/2013    Past Surgical History:  Procedure Laterality Date   APPENDECTOMY     CATARACT EXTRACTION, BILATERAL     COLONOSCOPY     TONSILLECTOMY      Prior to Admission medications   Medication Sig  Start Date End Date Taking? Authorizing Provider  Cyanocobalamin (VITAMIN B 12 PO) Take 1,000 mcg by mouth daily.    [provider]  primidone (MYSOLINE) 50 MG tablet TAKE 1 TABLET(50 MG) BY MOUTH EVERY NIGHT 10/05/17   [provider]  Propylene Glycol (SYSTANE COMPLETE OP) Apply 1 drop to eye in the morning, at noon, in the evening, and at bedtime.    [provider]  simvastatin (ZOCOR) 20 MG tablet TAKE 1 TABLET(20 MG) BY MOUTH EVERY NIGHT 11/07/17   [provider]  tamsulosin (FLOMAX) 0.4 MG CAPS capsule TAKE 1 CAPSULE BY MOUTH DAILY. TAKE 30 MINUTES AFTER SAME MEAL EACH DAY 11/07/17   [provider]    Allergies Patient has no known allergies.  Family History  Problem Relation Age of Onset   Alzheimer's disease Father    Diabetes Maternal Grandmother     Social History Social History   Tobacco Use   Smoking status: Former    Pack years: 0.00    Types: Cigarettes    Quit date: 2012    Years since quitting: 10.5   Smokeless tobacco: Never  Vaping Use   Vaping Use: Never used  Substance Use Topics   Alcohol use: Yes    Comment: once a month liquor   Drug use: Never    Review of Systems Constitutional: No fever/chills Eyes: No visual changes. ENT: No sore throat. Cardiovascular: Positive for chest pain.  Respiratory: Denies shortness of breath. Gastrointestinal: No abdominal pain.  No nausea,  no vomiting.  No diarrhea.   Genitourinary: Negative for dysuria. Musculoskeletal: Negative for back pain. Skin: Negative for rash. Neurological: Negative for headaches, focal weakness or numbness.  ____________________________________________   PHYSICAL EXAM:  VITAL SIGNS: ED Triage Vitals  Enc Vitals Group     BP 11/15/20 1747 (!) 175/97     Pulse Rate 11/15/20 1747 74     Resp 11/15/20 1747 18     Temp 11/15/20 1747 99 F (37.2 C)     Temp src --      SpO2 11/15/20 1747 99 %     Weight 11/15/20 1750 210 lb (95.3 kg)      Height 11/15/20 1750 5\' 11"  (1.803 m)     Head Circumference --      Peak Flow --      Pain Score 11/15/20 1750 0   Constitutional: Alert and oriented.  Eyes: Conjunctivae are normal.  ENT      Head: Normocephalic and atraumatic.      Nose: No congestion/rhinnorhea.      Mouth/Throat: Mucous membranes are moist.      Neck: No stridor. Hematological/Lymphatic/Immunilogical: No cervical lymphadenopathy. Cardiovascular: Normal rate, regular rhythm.  No murmurs, rubs, or gallops.  Respiratory: Normal respiratory effort without tachypnea nor retractions. Breath sounds are clear and equal bilaterally. No wheezes/rales/rhonchi. Gastrointestinal: Soft and non tender. No rebound. No guarding.  Genitourinary: Deferred Musculoskeletal: Normal range of motion in all extremities. No lower extremity edema. Neurologic:  Normal speech and language. No gross focal neurologic deficits are appreciated.  Skin:  Skin is warm, dry and intact. No rash noted. Psychiatric: Mood and affect are normal. Speech and behavior are normal. Patient exhibits appropriate insight and judgment.  ____________________________________________    LABS (pertinent positives/negatives)  Trop hs 28 to 50 CBC wbc 6.6, hgb 15.7, plt 191 BMP wnl except na 134, glu 100, cr 1.47  ____________________________________________   EKG  I, Nance Pear, attending physician, personally viewed and interpreted this EKG  EKG Time: 1751 Rate: 74 Rhythm: normal sinus rhythm Axis: normal Intervals: qtc 424 QRS: narrow ST changes: no st elevation Impression: normal ekg  ____________________________________________    RADIOLOGY  CXR Low lung volumes without acute abnormality  ____________________________________________   PROCEDURES  Procedures  ____________________________________________   INITIAL IMPRESSION / ASSESSMENT AND PLAN / ED COURSE  Pertinent labs & imaging results that were available during my  care of the patient were reviewed by me and considered in my medical decision making (see chart for details).   Patient presented to the emergency department today because of concern for chest pain. Had negative work up performed two days ago however left prior to seeing a provider. Work up today was concerning for elevating troponin. Did get CT angio dissection given that the pain radiated to the back. No dissection, no central PE. Discussed elevating troponin with patient. Will plan on admission for further work up and management. Patient had taken a baby aspirin prior to arrival to the ED.   ____________________________________________   FINAL CLINICAL IMPRESSION(S) / ED DIAGNOSES  Final diagnoses:  Chest pain, unspecified type  Elevated troponin     Note: This dictation was prepared with Dragon dictation. Any transcriptional errors that result from this process are unintentional     Nance Pear, MD 11/15/20 2213

## 2020-11-15 NOTE — ED Notes (Signed)
Patient's wife at the bedside. Updated with verbal understanding, awaiting evaluation. Vitals are stable, NSR on the cardiac monitor.

## 2020-11-15 NOTE — H&P (Addendum)
History and Physical   Steve Warren MWU:[id number] DOB: [birth year] DOA: 11/15/2020  PCP: Sofie Hartigan, MD  Outpatient Specialists: Dr. Holley Raring Patient coming from: home   I have personally briefly reviewed patient's old medical records in [place].  Chief Concern: chest pain  HPI: Steve Warren is a 75 y.o. male with medical history significant for essential tremors, hyperlipidemia, BPH, CKD 3A, proteinuria, GERD, presents to the emergency department for chief concerns of chest pain.  He reports that the chest pain started on 11/11/20, while he was doing something, such as 'tinkering around the yard'. Even light activitiy causes the chest pain to come.  And since then, he reports that his chest pain comes on when he is doing something.  He tried tylenol, baby aspirion, tums (all three) and the pain resolves in about 30-45 minutes.  He does not know which medication improves the pain as he takes all 3 at once.  He describes the pain as sharp.  He endorses that taking deep breaths makes the chest pain worse. He denies shortness of breath. He endorses associated dizziness. He denies upper extremity pain, numbness, tingling, jaw pain, neck pain, abnornal taste inn his mouth. He endorses that on AM of 11/13/20, he woke up with chest pain and clamminess. He has never felt this way before.  Of note, he initially presented to the emergency department on 11/13/2020 for the same complaint.  However, he left prior to being evaluated by an emergency provider.  11/13/2020: High-sensitivity troponin was 8 and increased to 14.  11/15/2020: High sensitive troponin today, was 28 and increased to 50.  Social history: He lives with his wife of nearly 43 years. He does not use tobacco products, and 30 years ago he used to smoke small cigars per day. He endorses infrequent etoh use, probably once per month. He denies recreational drugs. He is retired and formerly worked as Nurse, learning disability for  [employer].  Vaccination history: He is vaccinated for covid 19 with 4 shots of Pfizer  ROS: Constitutional: no weight change, no fever ENT/Mouth: no sore throat, no rhinorrhea Eyes: no eye pain, no vision changes Cardiovascular: + chest pain, no dyspnea,  no edema, no palpitations Respiratory: no cough, no sputum, no wheezing Gastrointestinal: no nausea, no vomiting, no diarrhea, no constipation Genitourinary: no urinary incontinence, no dysuria, no hematuria Musculoskeletal: no arthralgias, no myalgias Skin: no skin lesions, no pruritus, Neuro: + weakness, no loss of consciousness, no syncope Psych: no anxiety, no depression, + decrease appetite Heme/Lymph: no bruising, no bleeding  ED Course: Discussed with ED provider, patient requiring hospitalization for chief concerns of chest pain.  Vitals in the emergency department was remarkable for temperature of 99, respiration rate of 18, heart rate of 74, blood pressure 175/97, SPO2 of 99% on room air.  Labs in the emergency department was remarkable for troponin 28 and increased to 50  Serum sodium is 134, potassium 4.1, BUN 21, serum creatinine of 1.47, nonfasting blood glucose 100, WBC 6.6, hemoglobin 15.7, platelets 191  EDP gave patient 1 dose of aspirin to 43 mg p.o.  Assessment/Plan  Principal Problem:   Chest pain Active Problems:   MGUS (monoclonal gammopathy of unknown significance)   BPH (benign prostatic hyperplasia)   Stage 3 chronic kidney disease   Gastroesophageal reflux disease without esophagitis   Hyperlipidemia   Pure hypercholesterolemia   Tremors of nervous system   Proteinuria   Elevated troponin   # Chest pain  # CT read  of aortic atherosclerosis and 2 vessel coronary artery calcifications # NSTEMI - Troponin trending up, 28 and 50 on day of presentation from not elevated on 11/13/2020 - Status post aspirin to 43 mg per EDP - EKG was negative for ST-T wave changes - Aspirin 81 mg daily resumed for  11/16/2020 - Adding a BNP - Complete echo ordered - Heparin GTT per pharmacy initiated - I placed in routine, 24-hour Epic cardiology consult to Dr. Rockey Situ as he is the unassigned cardiology today - Nitroglycerin 1 inch ointment, every 6 hours as needed for chest pain - A.m. team to consult cardiology  # Stage CKD IIIa-at baseline, serum creatinine on presentation is 1.47, EGFR 49 - Baseline serum creatinine is 1.3-1.5, EGFR 46-49  # History of tremors-patient takes primidone 50 mg daily - Resumed primidone 50 mg in the a.m.  # Hyperlipidemia-simvastatin 20 mg nightly # Stable pulmonary nodule-outpatient follow-up # BPH-Flomax 0.4 mg daily  COVID/Influenza A/Influenza B PCR are pending  AM team to complete medication reconciliation.  Chart reviewed.   DVT prophylaxis: Heparin GGT Code Status: full code  Diet: Heart healthy now Family Communication: Spouse at bedside, Infant [place]  Disposition Plan: Pending clinical course Consults called: Cardiology Admission status: Observation, progressive cardiac, telemetry  Past Medical History:  Diagnosis Date   Anxiety    BPH (benign prostatic hyperplasia)    Cataract    bilateral- Dec 2021 and jan 2022   GERD (gastroesophageal reflux disease)    Hypercholesterolemia    Hyperlipidemia    Hypertension    Past Surgical History:  Procedure Laterality Date   APPENDECTOMY     CATARACT EXTRACTION, BILATERAL     COLONOSCOPY     TONSILLECTOMY     Social History:  reports that he quit smoking about 10 years ago. He has never used smokeless tobacco. He reports current alcohol use. He reports that he does not use drugs.  No Known Allergies Family History  Problem Relation Age of Onset   Alzheimer's disease Father    Diabetes Maternal Grandmother    Family history: Family history reviewed and not pertinent  Prior to Admission medications   Medication Sig Start Date End Date Taking? Authorizing Provider  Cyanocobalamin (VITAMIN  B 12 PO) Take 1,000 mcg by mouth daily.    [provider]  primidone (MYSOLINE) 50 MG tablet TAKE 1 TABLET(50 MG) BY MOUTH EVERY NIGHT 10/05/17   [provider]  Propylene Glycol (SYSTANE COMPLETE OP) Apply 1 drop to eye in the morning, at noon, in the evening, and at bedtime.    [provider]  simvastatin (ZOCOR) 20 MG tablet TAKE 1 TABLET(20 MG) BY MOUTH EVERY NIGHT 11/07/17   [provider]  tamsulosin (FLOMAX) 0.4 MG CAPS capsule TAKE 1 CAPSULE BY MOUTH DAILY. TAKE 30 MINUTES AFTER SAME MEAL EACH DAY 11/07/17   [provider]   Physical Exam: Vitals:   11/15/20 2000 11/15/20 2049 11/15/20 2130 11/15/20 2200  BP: (!) 170/85 (!) 152/88 (!) 155/88 (!) 149/87  Pulse: 72 68 68 67  Resp: 18 17 (!) 27 (!) 24  Temp:      SpO2: 98% 100% 96% 96%  Weight:      Height:       Constitutional: appears age appropriate, NAD, calm, comfortable Eyes: PERRL, lids and conjunctivae normal ENMT: Mucous membranes are moist. Posterior pharynx clear of any exudate or lesions. Age-appropriate dentition. Hearing appropriate Neck: normal, supple, no masses, no thyromegaly Respiratory: clear to auscultation bilaterally, no  wheezing, no crackles. Normal respiratory effort. No accessory muscle use.  Cardiovascular: Regular rate and rhythm, no murmurs / rubs / gallops. No extremity edema. 2+ pedal pulses. No carotid bruits.  Abdomen: no tenderness, no masses palpated, no hepatosplenomegaly. Bowel sounds positive.  Musculoskeletal: no clubbing / cyanosis. No joint deformity upper and lower extremities. Good ROM, no contractures, no atrophy. Normal muscle tone.  Skin: no rashes, lesions, ulcers. No induration Neurologic: Sensation intact. Strength 5/5 in all 4.  Psychiatric: Normal judgment and insight. Alert and oriented x 3. Normal mood.   EKG: independently reviewed, showing sinus rhythm with rate of 74, QTc 424  Chest x-ray on Admission: I personally reviewed and I  agree with radiologist reading as below.  DG Chest 2 View  Result Date: 11/15/2020 CLINICAL DATA:  Chest pain. EXAM: CHEST - 2 VIEW COMPARISON:  Radiograph 2 days ago 11/13/2020.  Chest CT 10/14/2019 FINDINGS: Low lung volumes persist. No acute airspace disease. Stable heart size and mediastinal contours. Right middle lobe pulmonary nodule stable dating back to 2021 CT. Known left lung pulmonary nodule is tentatively seen. No pulmonary edema, pleural effusion, or pneumothorax. No acute osseous abnormalities are seen. IMPRESSION: Low lung volumes without acute abnormality. Electronically Signed   By: Keith Rake M.D.   On: 11/15/2020 18:27   CT Angio Chest/Abd/Pel for Dissection W and/or Wo Contrast  Result Date: 11/15/2020 CLINICAL DATA:  Chest pain. EXAM: CT ANGIOGRAPHY CHEST, ABDOMEN AND PELVIS TECHNIQUE: Non-contrast CT of the chest was initially obtained. Multidetector CT imaging through the chest, abdomen and pelvis was performed using the standard protocol during bolus administration of intravenous contrast. Multiplanar reconstructed images and MIPs were obtained and reviewed to evaluate the vascular anatomy. CONTRAST:  157m OMNIPAQUE IOHEXOL 350 MG/ML SOLN COMPARISON:  CT chest 10/14/2019, CT abdomen pelvis 05/24/2017 FINDINGS: CTA CHEST FINDINGS Cardiovascular: Preferential opacification of the thoracic aorta. No evidence of thoracic aortic aneurysm or dissection. Mild atherosclerotic plaque. At least 2 vessel coronary artery calcifications. Normal heart size. No pericardial effusion. The main pulmonary artery is normal in caliber. No central or segmental pulmonary embolus. Mediastinum/Nodes: No enlarged mediastinal, hilar, or axillary lymph nodes. Thyroid gland, trachea, and esophagus demonstrate no significant findings. Lungs/Pleura: Stable 1.5 cm right upper lobe pulmonary nodule along the minor fissure. Stable 0.7 cm left lower lobe pulmonary nodule. Stable left lower lobe pulmonary  micronodule (7:83). No new pulmonary nodule. No focal consolidation. No pleural effusion. No pneumothorax. Musculoskeletal: No chest wall abnormality. No suspicious lytic or blastic osseous lesions. No acute displaced fracture. Multilevel degenerative changes of the spine. Review of the MIP images confirms the above findings. CTA ABDOMEN AND PELVIS FINDINGS VASCULAR Aorta: Mild to moderate calcified and noncalcified atherosclerotic plaque. Normal caliber aorta without aneurysm, dissection, vasculitis or significant stenosis. Celiac: Patent without evidence of aneurysm, dissection, vasculitis or significant stenosis. SMA: Patent without evidence of aneurysm, dissection, vasculitis or significant stenosis. Renals: Duplicated right renal arteries. Both renal arteries are patent without evidence of aneurysm, dissection, vasculitis, fibromuscular dysplasia or significant stenosis. IMA: Patent without evidence of aneurysm, dissection, vasculitis or significant stenosis. Inflow: Mild atherosclerotic plaque. Patent without evidence of aneurysm, dissection, vasculitis or significant stenosis. Veins: No obvious venous abnormality within the limitations of this arterial phase study. Review of the MIP images confirms the above findings. NON-VASCULAR Hepatobiliary: Slightly increased in size fluid density 3.7 cm lesion within the left hepatic lobe that likely represents a simple cyst. Subcentimeter hypodensities are too small to characterize. No gallstones, gallbladder wall thickening, or  pericholecystic fluid. No biliary dilatation. Pancreas: No focal lesion. Normal pancreatic contour. No surrounding inflammatory changes. No main pancreatic ductal dilatation. Spleen: Normal in size without focal abnormality. Adrenals/Urinary Tract: No adrenal nodule bilaterally. Bilateral kidneys enhance symmetrically. No hydronephrosis. No hydroureter. The urinary bladder is unremarkable. Stomach/Bowel: Stomach is within normal limits. No  evidence of bowel wall thickening or dilatation. Diffuse left colon and sigmoid diverticulosis. The appendix is not definitely identified. Lymphatic: No lymphadenopathy. Reproductive: Enlarged prostate measuring up to 5.7 cm. Other: CT body other Musculoskeletal: Tiny fat containing left inguinal hernia. Couple of tiny fat containing umbilical hernias (67:54, 55; 5:181, 186). No suspicious lytic or blastic osseous lesions. No acute displaced fracture. Multilevel degenerative changes of the spine with severe left L4-L5 osseous neural foraminal stenosis. Review of the MIP images confirms the above findings. IMPRESSION: 1. No acute vascular abnormality. Aortic Atherosclerosis (ICD10-I70.0) including at least 2 vessel coronary artery calcifications. 2. Stable pulmonary nodules.  No new pulmonary nodules. 3. Colonic diverticulosis with no acute diverticulitis. 4. 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 Electronically Signed   By: Iven Finn M.D.   On: 11/15/2020 21:31    Labs on Admission: I have personally reviewed following labs  CBC: Recent Labs  Lab 11/13/20 0319 11/15/20 1755  WBC 6.1 6.6  HGB 14.9 15.7  HCT 45.4 48.1  MCV 95.2 95.6  PLT 163 492   Basic Metabolic Panel: Recent Labs  Lab 11/13/20 0319 11/15/20 1755  NA 136 134*  K 5.3* 4.1  CL 102 100  CO2 28 30  GLUCOSE 114* 100*  BUN 20 21  CREATININE 1.21 1.47*  CALCIUM 8.8* 10.0   GFR: Estimated Creatinine Clearance: 50.4 mL/min (A) (by C-G formula based on SCr of 1.47 mg/dL (H)).  Urine analysis:    Component Value Date/Time   COLORURINE Amber 04/26/2012 0936   APPEARANCEUR Cloudy 04/26/2012 0936   LABSPEC 1.028 04/26/2012 0936   PHURINE 5.0 04/26/2012 0936   GLUCOSEU Negative 04/26/2012 0936   HGBUR 3+ 04/26/2012 0936   BILIRUBINUR Negative 04/26/2012 0936   KETONESUR Negative 04/26/2012 0936   PROTEINUR 30 mg/dL 04/26/2012 0936   NITRITE  Negative 04/26/2012 0936   LEUKOCYTESUR 1+ 04/26/2012 0936   Dr. Tobie Poet [facility]  If 7PM-7AM, please contact overnight-coverage provider If 7AM-7PM, please contact day coverage provider [email or web address]  11/15/2020, 10:37 PM

## 2020-11-15 NOTE — ED Notes (Addendum)
Nevin Bloodgood from lab called, Troponin is 45, MD notified and aware.

## 2020-11-15 NOTE — ED Notes (Signed)
Admitting MD at the bedside.  

## 2020-11-16 ENCOUNTER — Observation Stay (HOSPITAL_COMMUNITY)
Admit: 2020-11-16 | Discharge: 2020-11-16 | Disposition: A | Discharge status: Home / Self Care | Payer: Medicare PPO | Attending: Internal Medicine | Admitting: Internal Medicine

## 2020-11-16 ENCOUNTER — Encounter: Payer: Self-pay | Admitting: Internal Medicine

## 2020-11-16 DIAGNOSIS — R778 Other specified abnormalities of plasma proteins: Secondary | ICD-10-CM

## 2020-11-16 DIAGNOSIS — Z833 Family history of diabetes mellitus: Secondary | ICD-10-CM | POA: Diagnosis not present

## 2020-11-16 DIAGNOSIS — D472 Monoclonal gammopathy: Secondary | ICD-10-CM | POA: Diagnosis present

## 2020-11-16 DIAGNOSIS — R072 Precordial pain: Secondary | ICD-10-CM | POA: Diagnosis not present

## 2020-11-16 DIAGNOSIS — K219 Gastro-esophageal reflux disease without esophagitis: Secondary | ICD-10-CM | POA: Diagnosis present

## 2020-11-16 DIAGNOSIS — E7800 Pure hypercholesterolemia, unspecified: Secondary | ICD-10-CM | POA: Diagnosis present

## 2020-11-16 DIAGNOSIS — G250 Essential tremor: Secondary | ICD-10-CM | POA: Diagnosis present

## 2020-11-16 DIAGNOSIS — Z820 Family history of epilepsy and other diseases of the nervous system: Secondary | ICD-10-CM | POA: Diagnosis not present

## 2020-11-16 DIAGNOSIS — I214 Non-ST elevation (NSTEMI) myocardial infarction: Secondary | ICD-10-CM

## 2020-11-16 DIAGNOSIS — R0789 Other chest pain: Secondary | ICD-10-CM | POA: Diagnosis present

## 2020-11-16 DIAGNOSIS — R911 Solitary pulmonary nodule: Secondary | ICD-10-CM | POA: Diagnosis present

## 2020-11-16 DIAGNOSIS — Z79899 Other long term (current) drug therapy: Secondary | ICD-10-CM | POA: Diagnosis not present

## 2020-11-16 DIAGNOSIS — N400 Enlarged prostate without lower urinary tract symptoms: Secondary | ICD-10-CM | POA: Diagnosis present

## 2020-11-16 DIAGNOSIS — N1831 Chronic kidney disease, stage 3a: Secondary | ICD-10-CM | POA: Diagnosis present

## 2020-11-16 DIAGNOSIS — I2510 Atherosclerotic heart disease of native coronary artery without angina pectoris: Secondary | ICD-10-CM | POA: Diagnosis present

## 2020-11-16 DIAGNOSIS — Z20822 Contact with and (suspected) exposure to covid-19: Secondary | ICD-10-CM | POA: Diagnosis present

## 2020-11-16 DIAGNOSIS — R079 Chest pain, unspecified: Secondary | ICD-10-CM

## 2020-11-16 DIAGNOSIS — I420 Dilated cardiomyopathy: Secondary | ICD-10-CM | POA: Diagnosis not present

## 2020-11-16 DIAGNOSIS — I5032 Chronic diastolic (congestive) heart failure: Secondary | ICD-10-CM | POA: Diagnosis present

## 2020-11-16 DIAGNOSIS — I700 Atherosclerosis of aorta: Secondary | ICD-10-CM | POA: Diagnosis present

## 2020-11-16 DIAGNOSIS — R42 Dizziness and giddiness: Secondary | ICD-10-CM | POA: Diagnosis present

## 2020-11-16 DIAGNOSIS — Z87891 Personal history of nicotine dependence: Secondary | ICD-10-CM | POA: Diagnosis not present

## 2020-11-16 DIAGNOSIS — I10 Essential (primary) hypertension: Secondary | ICD-10-CM | POA: Diagnosis not present

## 2020-11-16 DIAGNOSIS — I429 Cardiomyopathy, unspecified: Secondary | ICD-10-CM | POA: Diagnosis present

## 2020-11-16 DIAGNOSIS — I130 Hypertensive heart and chronic kidney disease with heart failure and stage 1 through stage 4 chronic kidney disease, or unspecified chronic kidney disease: Secondary | ICD-10-CM | POA: Diagnosis present

## 2020-11-16 LAB — LIPID PANEL
Cholesterol: 128 mg/dL (ref 0–200)
HDL: 40 mg/dL — ABNORMAL LOW (ref >40)
LDL Cholesterol: 77 mg/dL (ref 0–99)
Total CHOL/HDL Ratio: 3.2 RATIO
Triglycerides: 55 mg/dL (ref <150)
VLDL: 11 mg/dL (ref 0–40)

## 2020-11-16 LAB — CBC
HCT: 45.2 % (ref 39.0–52.0)
Hemoglobin: 14.8 g/dL (ref 13.0–17.0)
MCH: 31 pg (ref 26.0–34.0)
MCHC: 32.7 g/dL (ref 30.0–36.0)
MCV: 94.6 fL (ref 80.0–100.0)
Platelets: 172 10*3/uL (ref 150–400)
RBC: 4.78 MIL/uL (ref 4.22–5.81)
RDW: 13.1 % (ref 11.5–15.5)
WBC: 7.7 10*3/uL (ref 4.0–10.5)
nRBC: 0 % (ref 0.0–0.2)

## 2020-11-16 LAB — BASIC METABOLIC PANEL
Anion gap: 6 (ref 5–15)
BUN: 20 mg/dL (ref 8–23)
CO2: 29 mmol/L (ref 22–32)
Calcium: 9.3 mg/dL (ref 8.9–10.3)
Chloride: 100 mmol/L (ref 98–111)
Creatinine, Ser: 1.34 mg/dL — ABNORMAL HIGH (ref 0.61–1.24)
GFR, Estimated: 55 mL/min — ABNORMAL LOW (ref >60)
Glucose, Bld: 99 mg/dL (ref 70–99)
Potassium: 4.7 mmol/L (ref 3.5–5.1)
Sodium: 135 mmol/L (ref 135–145)

## 2020-11-16 LAB — HEPARIN LEVEL (UNFRACTIONATED)
Heparin Unfractionated: 0.78 IU/mL — ABNORMAL HIGH (ref 0.30–0.70)
Heparin Unfractionated: 0.73 IU/mL — ABNORMAL HIGH (ref 0.30–0.70)

## 2020-11-16 LAB — ECHOCARDIOGRAM COMPLETE
AR max vel: 2.84 cm2
AV Area VTI: 3.58 cm2
AV Area mean vel: 2.69 cm2
AV Mean grad: 2 mmHg
AV Peak grad: 3.5 mmHg
Ao pk vel: 0.93 m/s
Area-P 1/2: 2.18 cm2
Height: 71 in
S' Lateral: 2.82 cm
Single Plane A4C EF: 15.4 %
Weight: 3360 oz

## 2020-11-16 LAB — VITAMIN B12: Vitamin B-12: 889 pg/mL (ref 180–914)

## 2020-11-16 LAB — TROPONIN I (HIGH SENSITIVITY): Troponin I (High Sensitivity): 61 ng/L — ABNORMAL HIGH (ref <18)

## 2020-11-16 MED ORDER — RISAQUAD PO CAPS
1.0000 | ORAL_CAPSULE | Freq: Every day | ORAL | Status: DC
  Administered 2020-11-17 – 2020-11-18 (×2): 1 via ORAL
  Filled 2020-11-16 (×2): qty 1

## 2020-11-16 MED ORDER — METOPROLOL SUCCINATE ER 25 MG PO TB24
12.5000 mg | ORAL_TABLET | Freq: Every day | ORAL | Status: DC
  Administered 2020-11-16 – 2020-11-18 (×3): 12.5 mg via ORAL
  Filled 2020-11-16 (×3): qty 1

## 2020-11-16 NOTE — Progress Notes (Signed)
ANTICOAGULATION CONSULT NOTE  Pharmacy Consult for Heparin  Indication: chest pain/ACS  No Known Allergies  Patient Measurements: Height: 5\' 11"  (180.3 cm) Weight: 95.8 kg (211 lb 1.6 oz) IBW/kg (Calculated) : 75.3 Heparin Dosing Weight: 94.5 kg   Vital Signs: Temp: 98.5 F (36.9 C) (07/05 1419) Temp Source: Oral (07/05 1419) BP: 120/93 (07/05 1419) Pulse Rate: 69 (07/05 1419)  Labs: Recent Labs    11/15/20 1755 11/15/20 1800 11/15/20 2035 11/15/20 2309 11/16/20 0622 11/16/20 1601  HGB 15.7  --   --   --  14.8  --   HCT 48.1  --   --   --  45.2  --   PLT 191  --   --   --  172  --   APTT  --  33  --   --   --   --   LABPROT  --  13.3  --   --   --   --   INR  --  1.0  --   --   --   --   HEPARINUNFRC  --   --   --   --  0.78* 0.73*  CREATININE 1.47*  --   --   --  1.34*  --   TROPONINIHS 28*  --  50* 86* 61*  --      Estimated Creatinine Clearance: 55.4 mL/min (A) (by C-G formula based on SCr of 1.34 mg/dL (H)).   Medical History: Past Medical History:  Diagnosis Date   Anxiety    BPH (benign prostatic hyperplasia)    Cataract    bilateral- Dec 2021 and jan 2022   GERD (gastroesophageal reflux disease)    Hypercholesterolemia    Hyperlipidemia    Hypertension     Medications:  Medications Prior to Admission  Medication Sig Dispense Refill Last Dose   Bacillus Coagulans-Inulin (PROBIOTIC) 1-250 BILLION-MG CAPS Take 1 capsule by mouth daily.   11/15/2020 at 1300   Cyanocobalamin (VITAMIN B 12 PO) Take 1,000 mcg by mouth daily.   11/15/2020 at 1300   neomycin-bacitracin-polymyxin (NEOSPORIN) ointment Apply 1 application topically every 12 (twelve) hours.   PRN at prn   primidone (MYSOLINE) 50 MG tablet Take 50 mg by mouth at bedtime.   11/15/2020 at 2100   Propylene Glycol (SYSTANE COMPLETE OP) Place 1 drop into both eyes in the morning, at noon, and at bedtime.   11/15/2020 at 2100   simvastatin (ZOCOR) 20 MG tablet Take 20 mg by mouth daily.   11/15/2020 at 0800    tamsulosin (FLOMAX) 0.4 MG CAPS capsule Take 0.4 mg by mouth daily. At lunchtime   11/15/2020 at 1300    Assessment: Pharmacy consulted to dose heparin in this 75 year old male admitted with ACS/NSTEMI.  No prior anticoag noted. CrCl = 55.3 ml/min   07/05 0622 HL 0.78 @ 1300 units/hr 07/05 1604 HL 0.73 @ 1200 units/hr  Goal of Therapy:  Heparin level 0.3-0.7 units/ml Monitor platelets by anticoagulation protocol: Yes   Plan:  Will decrease rate to 1050 units/hr Will recheck heparin level 8 hours after rate change CBC's daily on heparin drip per protocol  Dorothe Pea, PharmD, BCPS Clinical Pharmacist 11/16/2020 4:22 PM

## 2020-11-16 NOTE — Progress Notes (Signed)
PROGRESS NOTE    Steve Warren   KVQ:[id number]  DOB: Jul 03, 1945  PCP: Sofie Hartigan, MD    DOA: 11/15/2020 LOS: 0   Assessment & Plan   Principal Problem:   Chest pain Active Problems:   MGUS (monoclonal gammopathy of unknown significance)   BPH (benign prostatic hyperplasia)   Stage 3 chronic kidney disease   Gastroesophageal reflux disease without esophagitis   Hyperlipidemia   Pure hypercholesterolemia   Tremors of nervous system   Proteinuria   Elevated troponin   NSTEMI (non-ST elevated myocardial infarction) ([place])   NSTEMI - presented with 4 day hx of chest pain, DOE, in addition to hs-troponin elevation.   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 --started on metoprolol --monitor volume status --hold off ACEI/ARB for now   CKD stage IIIa - renal function near baseline (1.3-1.5). --would hydrate before/after cath if this is done --avoid other nephrotoxins and renally dose mends --follow BMP's daily  Hx Essential Tremor - continue home primidone  Hyperlipidemia - continue simvastatin.  Increase to high intensity if LDL>70 on FLP.    BPH - Flomax  Pulmonary nodule - stable.  Outpatient follow up.   Patient BMI: Body mass index is 29.44 kg/m.   DVT prophylaxis: Place TED hose Start: 11/15/20 2227   Diet:  Diet Orders (From admission, onward)     Start     Ordered   11/17/20 0001  Diet NPO time specified  Diet effective midnight        11/16/20 1331   11/15/20 2242  Diet Heart Room service appropriate? Yes; Fluid consistency: Thin  Diet effective now       Question Answer Comment  Room service appropriate? Yes    Fluid consistency: Thin      11/15/20 2241              Code Status: Full Code   Brief Narrative / Hospital Course to Date:   75 y.o. male with medical history significant for essential tremors, hyperlipidemia, BPH, CKD 3A,  MGUS, GERD, presented to the ED with chest pain which started on 6/30 while doing some yard work.  Reported intermittent chest pain brought on with physical activity since onset.  He reports a longer history of significant dyspnea on exertion and intermittent dizziness.  Evaluation in ED with elevated and rising hs-troponin's.  Admitted to [facility] service and started on heparin drip for possible NSTEMI. Cardiology consulted.    Subjective 11/16/20    Pt seen in ED holding for a bed, wife at bedside.  He denies chest pain this AM.  Has been having significant dyspnea with exertion for quite awhile now, the chest pain is new recently.  Denies other acute complaints or recent illnesses.     Disposition Plan & Communication   Status is: Inpatient  Remains inpatient appropriate because:Ongoing diagnostic testing needed not appropriate for outpatient work up, on IV heparin  Dispo: The patient is from: Home              Anticipated d/c is to: Home              Patient currently is not medically stable to d/c.   Difficult to place patient No  Family Communication: wife at bedside on rounds.   Consults, Procedures, Significant Events   Consultants:  cardiology  Procedures:  None - stress test and/or heart cath planned  Antimicrobials:  Anti-infectives (From admission, onward)    None         Micro    Objective   Vitals:   11/16/20 1000 11/16/20 1123 11/16/20 1408 11/16/20 1419  BP: 123/67  138/72 (!) 120/93  Pulse: 73  82 69  Resp: 20  16   Temp:  98.6 F (37 C) 98.2 F (36.8 C) 98.5 F (36.9 C)  TempSrc:  Oral Oral Oral  SpO2: 97%  98% 100%  Weight:    95.8 kg  Height:    5\' 11"  (1.803 m)    Intake/Output Summary (Last 24 hours)  at 11/16/2020 1632 Last data filed at 11/16/2020 1631 Gross per 24 hour  Intake 251.42 ml  Output 200 ml  Net 51.42 ml   Filed Weights   11/15/20 1750 11/16/20 1419  Weight: 95.3 kg 95.8 kg    Physical Exam:  General exam: awake, alert, no acute distress HEENT: atraumatic, clear conjunctiva, anicteric sclera, moist mucus membranes, hearing grossly normal  Respiratory system: CTAB, no wheezes, rales or rhonchi, normal respiratory effort. Cardiovascular system: normal S1/S2, RRR, +JVP, no pedal edema.   Gastrointestinal system: soft, NT, ND, no HSM felt, +bowel sounds. Central nervous system: A&O x3. no gross focal neurologic deficits, normal speech Extremities: moves all, no edema, normal tone Psychiatry: normal mood, congruent affect, judgement and insight appear normal  Labs   Data Reviewed: I have personally reviewed following labs and imaging studies  CBC: Recent Labs  Lab 11/13/20 0319 11/15/20 1755 11/16/20 0622  WBC 6.1 6.6 7.7  HGB 14.9 15.7 14.8  HCT 45.4 48.1 45.2  MCV 95.2 95.6 94.6  PLT 163 191 062   Basic Metabolic Panel: Recent Labs  Lab 11/13/20 0319 11/15/20 1755 11/16/20 0622  NA 136 134* 135  K 5.3* 4.1 4.7  CL 102 100 100  CO2 28 30 29   GLUCOSE 114* 100* 99  BUN 20 21 20   CREATININE 1.21 1.47* 1.34*  CALCIUM 8.8* 10.0 9.3   GFR: Estimated Creatinine Clearance: 55.4 mL/min (A) (by C-G formula based on SCr of 1.34 mg/dL (H)). Liver Function Tests: No results for input(s): AST, ALT, ALKPHOS, BILITOT, PROT, ALBUMIN in the last 168 hours. No results for input(s): LIPASE, AMYLASE in the last 168 hours. No results for input(s): AMMONIA in the last 168 hours. Coagulation Profile: Recent Labs  Lab 11/15/20 1800  INR 1.0   Cardiac Enzymes: No results for input(s): CKTOTAL, CKMB, CKMBINDEX, TROPONINI in the last 168 hours. BNP (last 3 results) No results for input(s): PROBNP in the last 8760 hours. HbA1C: No results for input(s): HGBA1C in the  last 72 hours. CBG: No results for input(s): GLUCAP in the last 168 hours. Lipid Profile: Recent Labs    11/16/20 1601  CHOL 128  HDL 40*  LDLCALC 77  TRIG 55  CHOLHDL 3.2   Thyroid Function Tests: No results for input(s): TSH, T4TOTAL, FREET4, T3FREE, THYROIDAB in the last 72 hours. Anemia Panel: Recent Labs    11/16/20 0622  VITAMINB12 889   Sepsis Labs: No results for input(s): PROCALCITON, LATICACIDVEN in the last 168 hours.  Recent Results (from the past 240 hour(s))  Resp Panel by RT-PCR (Flu A&B, Covid) Nasopharyngeal Swab     Status: None   Collection Time: 11/15/20 11:09 PM   Specimen: Nasopharyngeal  Swab; Nasopharyngeal(NP) swabs in vial transport medium  Result Value Ref Range Status   SARS Coronavirus 2 by RT PCR NEGATIVE NEGATIVE Final    Comment: (NOTE) SARS-CoV-2 target nucleic acids are NOT DETECTED.  The SARS-CoV-2 RNA is generally detectable in upper respiratory specimens during the acute phase of infection. The lowest concentration of SARS-CoV-2 viral copies this assay can detect is 138 copies/mL. A negative result does not preclude SARS-Cov-2 infection and should not be used as the sole basis for treatment or other patient management decisions. A negative result may occur with  improper specimen collection/handling, submission of specimen other than nasopharyngeal swab, presence of viral mutation(s) within the areas targeted by this assay, and inadequate number of viral copies(<138 copies/mL). A negative result must be combined with clinical observations, patient history, and epidemiological information. The expected result is Negative.  Fact Sheet for Patients:  EntrepreneurPulse.com.au  Fact Sheet for Healthcare Providers:  IncredibleEmployment.be  This test is no t yet approved or cleared by the [place] FDA and  has been authorized for detection and/or diagnosis of SARS-CoV-2 by FDA under an  Emergency Use Authorization (EUA). This EUA will remain  in effect (meaning this test can be used) for the duration of the COVID-19 declaration under Section 564(b)(1) of the Act, 21 U.S.C.section 360bbb-3(b)(1), unless the authorization is terminated  or revoked sooner.       Influenza A by PCR NEGATIVE NEGATIVE Final   Influenza B by PCR NEGATIVE NEGATIVE Final    Comment: (NOTE) The Xpert Xpress SARS-CoV-2/FLU/RSV plus assay is intended as an aid in the diagnosis of influenza from Nasopharyngeal swab specimens and should not be used as a sole basis for treatment. Nasal washings and aspirates are unacceptable for Xpert Xpress SARS-CoV-2/FLU/RSV testing.  Fact Sheet for Patients: EntrepreneurPulse.com.au  Fact Sheet for Healthcare Providers: IncredibleEmployment.be  This test is not yet approved or cleared by the [place] FDA and has been authorized for detection and/or diagnosis of SARS-CoV-2 by FDA under an Emergency Use Authorization (EUA). This EUA will remain in effect (meaning this test can be used) for the duration of the COVID-19 declaration under Section 564(b)(1) of the Act, 21 U.S.C. section 360bbb-3(b)(1), unless the authorization is terminated or revoked.  Performed at [facility], [address] [place], [place] [address]       Imaging Studies   DG Chest 2 View  Result Date: 11/15/2020 CLINICAL DATA:  Chest pain. EXAM: CHEST - 2 VIEW COMPARISON:  Radiograph 2 days ago 11/13/2020.  Chest CT 10/14/2019 FINDINGS: Low lung volumes persist. No acute airspace disease. Stable heart size and mediastinal contours. Right middle lobe pulmonary nodule stable dating back to 2021 CT. Known left lung pulmonary nodule is tentatively seen. No pulmonary edema, pleural effusion, or pneumothorax. No acute osseous abnormalities are seen. IMPRESSION: Low lung volumes without acute abnormality. Electronically Signed   By: Keith Rake M.D.   On: 11/15/2020 18:27   ECHOCARDIOGRAM COMPLETE  Result Date: 11/16/2020    ECHOCARDIOGRAM REPORT   Patient Name:   GIULIO BERTINO Date of Exam: 11/16/2020 Medical Rec #:  [id number]       Height:       71.0 in Accession #:    [id number]      Weight:       210.0 lb Date of Birth:  11-11-1945       BSA:          2.153 m Patient Age:    28 years  BP:           170/85 mmHg Patient Gender: M               HR:           67 bpm. Exam Location:  [facility] Procedure: 2D Echo, Cardiac Doppler and Color Doppler Indications:     Chest pain R07.9                  Elevated troponin  History:         Patient has no prior history of Echocardiogram examinations.                  Risk Factors:Dyslipidemia and Hypertension.  Sonographer:     Sherrie Sport RDCS (AE) Referring Phys:  [id number] AMY N COX Diagnosing Phys: [name] End MD  Sonographer Comments: No parasternal window and suboptimal apical window. IMPRESSIONS  1. Left ventricular ejection fraction, by estimation, is 45 to 50%. The left ventricle has mildly decreased function. The left ventricle demonstrates regional wall motion abnormalities (see scoring diagram/findings for description). There is mild left ventricular hypertrophy. Left ventricular diastolic parameters are consistent with Grade I diastolic dysfunction (impaired relaxation). There is dyskinesis of the left ventricular, basal-mid inferolateral wall and inferior wall.  2. Right ventricular systolic function is normal. The right ventricular size is normal.  3. The mitral valve was not well visualized. Trivial mitral valve regurgitation.  4. The aortic valve was not well visualized. Aortic valve regurgitation is not visualized. No aortic stenosis is present. FINDINGS  Left Ventricle: Left ventricular ejection fraction, by estimation, is 45 to 50%. The left ventricle has mildly decreased function. The left ventricle demonstrates regional wall motion abnormalities. The left ventricular internal  cavity size was normal in size. There is mild left ventricular hypertrophy. Left ventricular diastolic parameters are consistent with Grade I diastolic dysfunction (impaired relaxation). Right Ventricle: The right ventricular size is normal. Right vetricular wall thickness was not well visualized. Right ventricular systolic function is normal. Left Atrium: Left atrial size was normal in size. Right Atrium: Right atrial size was normal in size. Pericardium: The pericardium was not well visualized. Mitral Valve: The mitral valve was not well visualized. There is mild thickening of the mitral valve leaflet(s). There is mild calcification of the mitral valve leaflet(s). Trivial mitral valve regurgitation. Tricuspid Valve: The tricuspid valve is not well visualized. Tricuspid valve regurgitation is trivial. Aortic Valve: The aortic valve was not well visualized. Aortic valve regurgitation is not visualized. No aortic stenosis is present. Aortic valve mean gradient measures 2.0 mmHg. Aortic valve peak gradient measures 3.5 mmHg. Aortic valve area, by VTI measures 3.58 cm. Pulmonic Valve: The pulmonic valve was not well visualized. Aorta: The aortic root is normal in size and structure. IAS/Shunts: The interatrial septum was not well visualized.  LEFT VENTRICLE PLAX 2D LVIDd:         3.70 cm     Diastology LVIDs:         2.82 cm     LV e' medial:    5.00 cm/s LV PW:         1.12 cm     LV E/e' medial:  9.3 LV IVS:        0.94 cm     LV e' lateral:   5.77 cm/s LVOT diam:     2.10 cm     LV E/e' lateral: 8.1 LV SV:         52 LV  SV Index:   24 LVOT Area:     3.46 cm  LV Volumes (MOD) LV vol d, MOD A4C: 44.9 ml LV vol s, MOD A4C: 38.0 ml LV SV MOD A4C:     44.9 ml RIGHT VENTRICLE RV Basal diam:  2.71 cm RV S prime:     13.90 cm/s TAPSE (M-mode): 3.4 cm LEFT ATRIUM             Index       RIGHT ATRIUM          Index LA diam:        3.70 cm 1.72 cm/m  RA Area:     9.50 cm LA Vol (A2C):   37.5 ml 17.42 ml/m RA Volume:    18.80 ml 8.73 ml/m LA Vol (A4C):   37.2 ml 17.28 ml/m LA Biplane Vol: 41.4 ml 19.23 ml/m  AORTIC VALVE AV Area (Vmax):    2.84 cm AV Area (Vmean):   2.69 cm AV Area (VTI):     3.58 cm AV Vmax:           93.30 cm/s AV Vmean:          62.100 cm/s AV VTI:            0.145 m AV Peak Grad:      3.5 mmHg AV Mean Grad:      2.0 mmHg LVOT Vmax:         76.40 cm/s LVOT Vmean:        48.300 cm/s LVOT VTI:          0.150 m LVOT/AV VTI ratio: 1.03  AORTA Ao Root diam: 3.43 cm MITRAL VALVE               TRICUSPID VALVE MV Area (PHT): 2.18 cm    TR Peak grad:   8.9 mmHg MV Decel Time: 348 msec    TR Vmax:        149.00 cm/s MV E velocity: 46.70 cm/s MV A velocity: 73.30 cm/s  SHUNTS MV E/A ratio:  0.64        Systemic VTI:  0.15 m                            Systemic Diam: 2.10 cm Nelva Bush MD Electronically signed by Nelva Bush MD Signature Date/Time: 11/16/2020/12:50:09 PM    Final    CT Angio Chest/Abd/Pel for Dissection W and/or Wo Contrast  Result Date: 11/15/2020 CLINICAL DATA:  Chest pain. EXAM: CT ANGIOGRAPHY CHEST, ABDOMEN AND PELVIS TECHNIQUE: Non-contrast CT of the chest was initially obtained. Multidetector CT imaging through the chest, abdomen and pelvis was performed using the standard protocol during bolus administration of intravenous contrast. Multiplanar reconstructed images and MIPs were obtained and reviewed to evaluate the vascular anatomy. CONTRAST:  171mL OMNIPAQUE IOHEXOL 350 MG/ML SOLN COMPARISON:  CT chest 10/14/2019, CT abdomen pelvis 05/24/2017 FINDINGS: CTA CHEST FINDINGS Cardiovascular: Preferential opacification of the thoracic aorta. No evidence of thoracic aortic aneurysm or dissection. Mild atherosclerotic plaque. At least 2 vessel coronary artery calcifications. Normal heart size. No pericardial effusion. The main pulmonary artery is normal in caliber. No central or segmental pulmonary embolus. Mediastinum/Nodes: No enlarged mediastinal, hilar, or axillary lymph nodes. Thyroid  gland, trachea, and esophagus demonstrate no significant findings. Lungs/Pleura: Stable 1.5 cm right upper lobe pulmonary nodule along the minor fissure. Stable 0.7 cm left lower lobe pulmonary nodule. Stable left lower lobe pulmonary micronodule (  7:83). No new pulmonary nodule. No focal consolidation. No pleural effusion. No pneumothorax. Musculoskeletal: No chest wall abnormality. No suspicious lytic or blastic osseous lesions. No acute displaced fracture. Multilevel degenerative changes of the spine. Review of the MIP images confirms the above findings. CTA ABDOMEN AND PELVIS FINDINGS VASCULAR Aorta: Mild to moderate calcified and noncalcified atherosclerotic plaque. Normal caliber aorta without aneurysm, dissection, vasculitis or significant stenosis. Celiac: Patent without evidence of aneurysm, dissection, vasculitis or significant stenosis. SMA: Patent without evidence of aneurysm, dissection, vasculitis or significant stenosis. Renals: Duplicated right renal arteries. Both renal arteries are patent without evidence of aneurysm, dissection, vasculitis, fibromuscular dysplasia or significant stenosis. IMA: Patent without evidence of aneurysm, dissection, vasculitis or significant stenosis. Inflow: Mild atherosclerotic plaque. Patent without evidence of aneurysm, dissection, vasculitis or significant stenosis. Veins: No obvious venous abnormality within the limitations of this arterial phase study. Review of the MIP images confirms the above findings. NON-VASCULAR Hepatobiliary: Slightly increased in size fluid density 3.7 cm lesion within the left hepatic lobe that likely represents a simple cyst. Subcentimeter hypodensities are too small to characterize. No gallstones, gallbladder wall thickening, or pericholecystic fluid. No biliary dilatation. Pancreas: No focal lesion. Normal pancreatic contour. No surrounding inflammatory changes. No main pancreatic ductal dilatation. Spleen: Normal in size without focal  abnormality. Adrenals/Urinary Tract: No adrenal nodule bilaterally. Bilateral kidneys enhance symmetrically. No hydronephrosis. No hydroureter. The urinary bladder is unremarkable. Stomach/Bowel: Stomach is within normal limits. No evidence of bowel wall thickening or dilatation. Diffuse left colon and sigmoid diverticulosis. The appendix is not definitely identified. Lymphatic: No lymphadenopathy. Reproductive: Enlarged prostate measuring up to 5.7 cm. Other: CT body other Musculoskeletal: Tiny fat containing left inguinal hernia. Couple of tiny fat containing umbilical hernias (16:07, 55; 5:181, 186). No suspicious lytic or blastic osseous lesions. No acute displaced fracture. Multilevel degenerative changes of the spine with severe left L4-L5 osseous neural foraminal stenosis. Review of the MIP images confirms the above findings. IMPRESSION: 1. No acute vascular abnormality. Aortic Atherosclerosis (ICD10-I70.0) including at least 2 vessel coronary artery calcifications. 2. Stable pulmonary nodules.  No new pulmonary nodules. 3. Colonic diverticulosis with no acute diverticulitis. 4. 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 Electronically Signed   By: Iven Finn M.D.   On: 11/15/2020 21:31     Medications   Scheduled Meds:  aspirin EC  81 mg Oral Daily   metoprolol succinate  12.5 mg Oral Daily   primidone  50 mg Oral q AM   simvastatin  20 mg Oral QHS   tamsulosin  0.4 mg Oral Daily   vitamin B-12  1,000 mcg Oral Daily   Continuous Infusions:  heparin 1,050 Units/hr (11/16/20 1627)       LOS: 0 days    Time spent: 30 minutes    Ezekiel Slocumb, DO [facility]  11/16/2020, 4:32 PM      If 7PM-7AM, please contact night-coverage. How to contact the [facility] Attending or Consulting provider [place] or covering provider during after hours [place], for this patient?    Check the care team in [facility] and look for  a) attending/consulting [facility] provider listed and b) the [facility] team listed Log into [email or web address] and use [place]'s universal password to access. If you do not have the password, please contact the hospital operator. Locate the [facility] provider you are looking for under [facility] and page to a number that you can be directly reached.  If you still have difficulty reaching the provider, please page the [facility] (Director on Call) for the Hospitalists listed on amion for assistance.

## 2020-11-16 NOTE — ED Notes (Signed)
Chelsea RN aware of assigned bed 

## 2020-11-16 NOTE — Progress Notes (Signed)
*  PRELIMINARY RESULTS* Echocardiogram 2D Echocardiogram has been performed.  Sherrie Sport 11/16/2020, 10:19 AM

## 2020-11-16 NOTE — Consult Note (Signed)
Cardiology Consultation:   Patient ID: PLEAS CARNEAL MRN: [id number]; DOB: 14-May-1946  Admit date: 11/15/2020 Date of Consult: 11/16/2020  PCP:  Sofie Hartigan, MD   [facility] Providers Cardiologist:  New - Emrey Thornley   Patient Profile:   Steve Warren is a 75 y.o. male with a hx of hyperlipidemia, chronic kidney disease stage IIIa, MGUS, essential tremor, BPH, and GERD who is being seen 11/16/2020 for the evaluation of chest pain and elevated troponin at the request of Dr. Tobie Poet.  History of Present Illness:   Mr. Steve Warren reports a 4-day history of intermittent chest pain.  He describes a sharp sensation in the center of his chest that radiates to his back.  It seems to be associated primarily with activity, though he also wonders if eating may precipitate it.  He feels a little short of breath when the pain occurs, as if it is difficult to take in a big breath.  He otherwise denies associated symptoms including nausea, diaphoresis, and palpitations.  He initially presented to the ED 2 days ago for evaluation but left before being seen.  He returned yesterday due to recurrent chest pain, which he believes may be GERD.  He is currently chest pain-free.  His wife notes that Mr. Steve Warren complained about chest pain and shortness of breath walking from the parking lot into the emergency department yesterday, which concerns her for possible cardiac etiology.  In the ED, Mr. Racicot underwent CTA of the chest, abdomen, and pelvis which did not reveal any acute abnormalities.  EKG was normal.  Labs were notable for elevated high-sensitivity troponin I, peaking at 86.   Past Medical History:  Diagnosis Date   Anxiety    BPH (benign prostatic hyperplasia)    Cataract    bilateral- Dec 2021 and jan 2022   GERD (gastroesophageal reflux disease)    Hypercholesterolemia    Hyperlipidemia    Hypertension     Past Surgical History:  Procedure Laterality Date   APPENDECTOMY     CATARACT  EXTRACTION, BILATERAL     COLONOSCOPY     TONSILLECTOMY       Home Medications:  Prior to Admission medications   Medication Sig Start Date Tatiyanna Lashley Date Taking? Authorizing Provider  Bacillus Coagulans-Inulin (PROBIOTIC) 1-250 BILLION-MG CAPS Take 1 capsule by mouth daily.   Yes [provider]  Cyanocobalamin (VITAMIN B 12 PO) Take 1,000 mcg by mouth daily.   Yes [provider]  neomycin-bacitracin-polymyxin (NEOSPORIN) ointment Apply 1 application topically every 12 (twelve) hours.   Yes [provider]  primidone (MYSOLINE) 50 MG tablet Take 50 mg by mouth at bedtime. 10/05/17  Yes [provider]  Propylene Glycol (SYSTANE COMPLETE OP) Place 1 drop into both eyes in the morning, at noon, and at bedtime.   Yes [provider]  simvastatin (ZOCOR) 20 MG tablet Take 20 mg by mouth daily. 11/07/17  Yes [provider]  tamsulosin (FLOMAX) 0.4 MG CAPS capsule Take 0.4 mg by mouth daily. At lunchtime 11/07/17  Yes [provider]    Inpatient Medications: Scheduled Meds:  aspirin EC  81 mg Oral Daily   primidone  50 mg Oral q AM   simvastatin  20 mg Oral QHS   tamsulosin  0.4 mg Oral Daily   vitamin B-12  1,000 mcg Oral Daily   Continuous Infusions:  heparin 1,200 Units/hr (11/16/20 0727)   PRN Meds: acetaminophen, morphine injection, nitroGLYCERIN, ondansetron (ZOFRAN) IV  Allergies:   No  Known Allergies  Social History:   Social History   Socioeconomic History   Marital status: Married    Spouse name: Not on file   Number of children: Not on file   Years of education: Not on file   Highest education level: Not on file  Occupational History   Not on file  Tobacco Use   Smoking status: Former    Pack years: 0.00    Types: Cigarettes    Quit date: 2012    Years since quitting: 10.5   Smokeless tobacco: Never  Vaping Use   Vaping Use: Never used  Substance and Sexual Activity   Alcohol use: Yes    Comment:  once a month liquor   Drug use: Never   Sexual activity: Not Currently  Other Topics Concern   Not on file  Social History Narrative   Not on file   Social Determinants of Health   Financial Resource Strain: Not on file  Food Insecurity: Not on file  Transportation Needs: Not on file  Physical Activity: Not on file  Stress: Not on file  Social Connections: Not on file  Intimate Partner Violence: Not on file    Family History:   Family History  Problem Relation Age of Onset   Alzheimer's disease Father    Diabetes Maternal Grandmother      ROS:  Please see the history of present illness.  All other ROS reviewed and negative.     Physical Exam/Data:   Vitals:   11/16/20 0630 11/16/20 0851 11/16/20 1000 11/16/20 1123  BP: 126/76  123/67   Pulse: 65  73   Resp: (!) 24  20   Temp:  97.7 F (36.5 C)  98.6 F (37 C)  TempSrc:  Oral  Oral  SpO2: 96%  97%   Weight:      Height:        Intake/Output Summary (Last 24 hours) at 11/16/2020 1358 Last data filed at 11/16/2020 0727 Gross per 24 hour  Intake 144.98 ml  Output --  Net 144.98 ml   Last 3 Weights 11/15/2020 11/13/2020 06/11/2020  Weight (lbs) 210 lb 210 lb 213 lb 9.6 oz  Weight (kg) 95.255 kg 95.255 kg 96.888 kg     Body mass index is 29.29 kg/m.  General:  Well nourished, well developed, in no acute distress.  He is accompanied by his wife. HEENT: normal Lymph: no adenopathy Neck: no JVD Endocrine:  No thryomegaly Vascular: No carotid bruits; FA pulses 2+ bilaterally without bruits  Cardiac:  normal S1, S2; RRR; no murmurs, rubs, or gallops. Lungs:  clear to auscultation bilaterally, no wheezing, rhonchi or rales  Abd: soft, nontender, no hepatomegaly  Ext: no edema Musculoskeletal:  No deformities, BUE and BLE strength normal and equal Skin: warm and dry  Neuro:  CNs 2-12 intact, no focal abnormalities noted Psych:  Normal affect   EKG:  The EKG was personally reviewed and demonstrates: Normal sinus  rhythm without abnormality. Telemetry:  Telemetry was personally reviewed and demonstrates: Normal sinus rhythm  Relevant CV Studies: TTE (11/16/2020):  1. Left ventricular ejection fraction, by estimation, is 45 to 50%. The  left ventricle has mildly decreased function. The left ventricle  demonstrates regional wall motion abnormalities (see scoring  diagram/findings for description). There is mild left  ventricular hypertrophy. Left ventricular diastolic parameters are  consistent with Grade I diastolic dysfunction (impaired relaxation). There  is dyskinesis of the left ventricular, basal-mid inferolateral wall and  inferior wall.  2. Right ventricular systolic function is normal. The right ventricular  size is normal.   3. The mitral valve was not well visualized. Trivial mitral valve  regurgitation.   4. The aortic valve was not well visualized. Aortic valve regurgitation  is not visualized. No aortic stenosis is present.   Laboratory Data:  High Sensitivity Troponin:   Recent Labs  Lab 11/13/20 0625 11/15/20 1755 11/15/20 2035 11/15/20 2309 11/16/20 0622  TROPONINIHS 14 28* 50* 86* 61*     Chemistry Recent Labs  Lab 11/13/20 0319 11/15/20 1755 11/16/20 0622  NA 136 134* 135  K 5.3* 4.1 4.7  CL 102 100 100  CO2 28 30 29   GLUCOSE 114* 100* 99  BUN 20 21 20   CREATININE 1.21 1.47* 1.34*  CALCIUM 8.8* 10.0 9.3  GFRNONAA >60 49* 55*  ANIONGAP 6 4* 6    No results for input(s): PROT, ALBUMIN, AST, ALT, ALKPHOS, BILITOT in the last 168 hours. Hematology Recent Labs  Lab 11/13/20 0319 11/15/20 1755 11/16/20 0622  WBC 6.1 6.6 7.7  RBC 4.77 5.03 4.78  HGB 14.9 15.7 14.8  HCT 45.4 48.1 45.2  MCV 95.2 95.6 94.6  MCH 31.2 31.2 31.0  MCHC 32.8 32.6 32.7  RDW 13.0 13.1 13.1  PLT 163 191 172   BNP Recent Labs  Lab 11/15/20 1800  BNP 112.5*    DDimer No results for input(s): DDIMER in the last 168 hours.   Radiology/Studies:  DG Chest 2 View  Result  Date: 11/15/2020 CLINICAL DATA:  Chest pain. EXAM: CHEST - 2 VIEW COMPARISON:  Radiograph 2 days ago 11/13/2020.  Chest CT 10/14/2019 FINDINGS: Low lung volumes persist. No acute airspace disease. Stable heart size and mediastinal contours. Right middle lobe pulmonary nodule stable dating back to 2021 CT. Known left lung pulmonary nodule is tentatively seen. No pulmonary edema, pleural effusion, or pneumothorax. No acute osseous abnormalities are seen. IMPRESSION: Low lung volumes without acute abnormality. Electronically Signed   By: Keith Rake M.D.   On: 11/15/2020 18:27   DG Chest 2 View  Result Date: 11/13/2020 CLINICAL DATA:  Chest pain EXAM: CHEST - 2 VIEW COMPARISON:  None. FINDINGS: The heart size and mediastinal contours are within normal limits. Both lungs are clear. The visualized skeletal structures are unremarkable. IMPRESSION: No active cardiopulmonary disease. Electronically Signed   By: Ulyses Jarred M.D.   On: 11/13/2020 03:48   ECHOCARDIOGRAM COMPLETE  Result Date: 11/16/2020    ECHOCARDIOGRAM REPORT   Patient Name:   Steve Warren Date of Exam: 11/16/2020 Medical Rec #:  [id number]       Height:       71.0 in Accession #:    [id number]      Weight:       210.0 lb Date of Birth:  1945/07/29       BSA:          2.153 m Patient Age:    51 years        BP:           170/85 mmHg Patient Gender: M               HR:           67 bpm. Exam Location:  [facility] Procedure: 2D Echo, Cardiac Doppler and Color Doppler Indications:     Chest pain R07.9                  Elevated troponin  History:  Patient has no prior history of Echocardiogram examinations.                  Risk Factors:Dyslipidemia and Hypertension.  Sonographer:     Sherrie Sport RDCS (AE) Referring Phys:  [id number] AMY N COX Diagnosing Phys: [name] Whittaker Lenis MD  Sonographer Comments: No parasternal window and suboptimal apical window. IMPRESSIONS  1. Left ventricular ejection fraction, by estimation, is 45 to 50%. The left ventricle  has mildly decreased function. The left ventricle demonstrates regional wall motion abnormalities (see scoring diagram/findings for description). There is mild left ventricular hypertrophy. Left ventricular diastolic parameters are consistent with Grade I diastolic dysfunction (impaired relaxation). There is dyskinesis of the left ventricular, basal-mid inferolateral wall and inferior wall.  2. Right ventricular systolic function is normal. The right ventricular size is normal.  3. The mitral valve was not well visualized. Trivial mitral valve regurgitation.  4. The aortic valve was not well visualized. Aortic valve regurgitation is not visualized. No aortic stenosis is present. FINDINGS  Left Ventricle: Left ventricular ejection fraction, by estimation, is 45 to 50%. The left ventricle has mildly decreased function. The left ventricle demonstrates regional wall motion abnormalities. The left ventricular internal cavity size was normal in size. There is mild left ventricular hypertrophy. Left ventricular diastolic parameters are consistent with Grade I diastolic dysfunction (impaired relaxation). Right Ventricle: The right ventricular size is normal. Right vetricular wall thickness was not well visualized. Right ventricular systolic function is normal. Left Atrium: Left atrial size was normal in size. Right Atrium: Right atrial size was normal in size. Pericardium: The pericardium was not well visualized. Mitral Valve: The mitral valve was not well visualized. There is mild thickening of the mitral valve leaflet(s). There is mild calcification of the mitral valve leaflet(s). Trivial mitral valve regurgitation. Tricuspid Valve: The tricuspid valve is not well visualized. Tricuspid valve regurgitation is trivial. Aortic Valve: The aortic valve was not well visualized. Aortic valve regurgitation is not visualized. No aortic stenosis is present. Aortic valve mean gradient measures 2.0 mmHg. Aortic valve peak gradient  measures 3.5 mmHg. Aortic valve area, by VTI measures 3.58 cm. Pulmonic Valve: The pulmonic valve was not well visualized. Aorta: The aortic root is normal in size and structure. IAS/Shunts: The interatrial septum was not well visualized.  LEFT VENTRICLE PLAX 2D LVIDd:         3.70 cm     Diastology LVIDs:         2.82 cm     LV e' medial:    5.00 cm/s LV PW:         1.12 cm     LV E/e' medial:  9.3 LV IVS:        0.94 cm     LV e' lateral:   5.77 cm/s LVOT diam:     2.10 cm     LV E/e' lateral: 8.1 LV SV:         52 LV SV Index:   24 LVOT Area:     3.46 cm  LV Volumes (MOD) LV vol d, MOD A4C: 44.9 ml LV vol s, MOD A4C: 38.0 ml LV SV MOD A4C:     44.9 ml RIGHT VENTRICLE RV Basal diam:  2.71 cm RV S prime:     13.90 cm/s TAPSE (M-mode): 3.4 cm LEFT ATRIUM             Index       RIGHT ATRIUM  Index LA diam:        3.70 cm 1.72 cm/m  RA Area:     9.50 cm LA Vol (A2C):   37.5 ml 17.42 ml/m RA Volume:   18.80 ml 8.73 ml/m LA Vol (A4C):   37.2 ml 17.28 ml/m LA Biplane Vol: 41.4 ml 19.23 ml/m  AORTIC VALVE AV Area (Vmax):    2.84 cm AV Area (Vmean):   2.69 cm AV Area (VTI):     3.58 cm AV Vmax:           93.30 cm/s AV Vmean:          62.100 cm/s AV VTI:            0.145 m AV Peak Grad:      3.5 mmHg AV Mean Grad:      2.0 mmHg LVOT Vmax:         76.40 cm/s LVOT Vmean:        48.300 cm/s LVOT VTI:          0.150 m LVOT/AV VTI ratio: 1.03  AORTA Ao Root diam: 3.43 cm MITRAL VALVE               TRICUSPID VALVE MV Area (PHT): 2.18 cm    TR Peak grad:   8.9 mmHg MV Decel Time: 348 msec    TR Vmax:        149.00 cm/s MV E velocity: 46.70 cm/s MV A velocity: 73.30 cm/s  SHUNTS MV E/A ratio:  0.64        Systemic VTI:  0.15 m                            Systemic Diam: 2.10 cm Nelva Bush MD Electronically signed by Nelva Bush MD Signature Date/Time: 11/16/2020/12:50:09 PM    Final    CT Angio Chest/Abd/Pel for Dissection W and/or Wo Contrast  Result Date: 11/15/2020 CLINICAL DATA:  Chest pain. EXAM:  CT ANGIOGRAPHY CHEST, ABDOMEN AND PELVIS TECHNIQUE: Non-contrast CT of the chest was initially obtained. Multidetector CT imaging through the chest, abdomen and pelvis was performed using the standard protocol during bolus administration of intravenous contrast. Multiplanar reconstructed images and MIPs were obtained and reviewed to evaluate the vascular anatomy. CONTRAST:  175mL OMNIPAQUE IOHEXOL 350 MG/ML SOLN COMPARISON:  CT chest 10/14/2019, CT abdomen pelvis 05/24/2017 FINDINGS: CTA CHEST FINDINGS Cardiovascular: Preferential opacification of the thoracic aorta. No evidence of thoracic aortic aneurysm or dissection. Mild atherosclerotic plaque. At least 2 vessel coronary artery calcifications. Normal heart size. No pericardial effusion. The main pulmonary artery is normal in caliber. No central or segmental pulmonary embolus. Mediastinum/Nodes: No enlarged mediastinal, hilar, or axillary lymph nodes. Thyroid gland, trachea, and esophagus demonstrate no significant findings. Lungs/Pleura: Stable 1.5 cm right upper lobe pulmonary nodule along the minor fissure. Stable 0.7 cm left lower lobe pulmonary nodule. Stable left lower lobe pulmonary micronodule (7:83). No new pulmonary nodule. No focal consolidation. No pleural effusion. No pneumothorax. Musculoskeletal: No chest wall abnormality. No suspicious lytic or blastic osseous lesions. No acute displaced fracture. Multilevel degenerative changes of the spine. Review of the MIP images confirms the above findings. CTA ABDOMEN AND PELVIS FINDINGS VASCULAR Aorta: Mild to moderate calcified and noncalcified atherosclerotic plaque. Normal caliber aorta without aneurysm, dissection, vasculitis or significant stenosis. Celiac: Patent without evidence of aneurysm, dissection, vasculitis or significant stenosis. SMA: Patent without evidence of aneurysm, dissection, vasculitis or significant stenosis. Renals: Duplicated right renal  arteries. Both renal arteries are patent  without evidence of aneurysm, dissection, vasculitis, fibromuscular dysplasia or significant stenosis. IMA: Patent without evidence of aneurysm, dissection, vasculitis or significant stenosis. Inflow: Mild atherosclerotic plaque. Patent without evidence of aneurysm, dissection, vasculitis or significant stenosis. Veins: No obvious venous abnormality within the limitations of this arterial phase study. Review of the MIP images confirms the above findings. NON-VASCULAR Hepatobiliary: Slightly increased in size fluid density 3.7 cm lesion within the left hepatic lobe that likely represents a simple cyst. Subcentimeter hypodensities are too small to characterize. No gallstones, gallbladder wall thickening, or pericholecystic fluid. No biliary dilatation. Pancreas: No focal lesion. Normal pancreatic contour. No surrounding inflammatory changes. No main pancreatic ductal dilatation. Spleen: Normal in size without focal abnormality. Adrenals/Urinary Tract: No adrenal nodule bilaterally. Bilateral kidneys enhance symmetrically. No hydronephrosis. No hydroureter. The urinary bladder is unremarkable. Stomach/Bowel: Stomach is within normal limits. No evidence of bowel wall thickening or dilatation. Diffuse left colon and sigmoid diverticulosis. The appendix is not definitely identified. Lymphatic: No lymphadenopathy. Reproductive: Enlarged prostate measuring up to 5.7 cm. Other: CT body other Musculoskeletal: Tiny fat containing left inguinal hernia. Couple of tiny fat containing umbilical hernias (00:76, 55; 5:181, 186). No suspicious lytic or blastic osseous lesions. No acute displaced fracture. Multilevel degenerative changes of the spine with severe left L4-L5 osseous neural foraminal stenosis. Review of the MIP images confirms the above findings. IMPRESSION: 1. No acute vascular abnormality. Aortic Atherosclerosis (ICD10-I70.0) including at least 2 vessel coronary artery calcifications. 2. Stable pulmonary nodules.  No  new pulmonary nodules. 3. Colonic diverticulosis with no acute diverticulitis. 4. 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 Electronically Signed   By: Iven Finn M.D.   On: 11/15/2020 21:31     Assessment and Plan:   NSTEMI: Mr. Neumann presents with a 4-day history of chest pain that has both typical and atypical features.  I am concerned by his mildly elevated troponin as well as wall motion abnormality on echo that he has underlying ischemic heart disease contributing to his symptoms.  Mr. Gilson is reluctant to proceed with catheterization at this time given his CKD and IV contrast exposure for CTA last night.  We have discussed further work-up including catheterization and myocardial perfusion stress testing and have agreed to proceed with stress testing to evaluate for high risk ischemia.  If the study is low risk, I think it would be reasonable to continue with medical therapy.  Otherwise, he will need to undergo catheterization prior to discharge.  In the meantime, IV heparin can be continued.  We will check a fasting lipid panel with plans to escalate to high intensity statin therapy if his LDL is above 70.  I will also add pantoprazole for possible component of GERD.  I will add low-dose metoprolol for antianginal therapy as well as evidence-based heart failure therapy the setting of mildly reduced LVEF by echo.  Cardiomyopathy: LVEF mildly reduced by echo with inferior/inferolateral wall motion abnormality (images were suboptimal quality).  We will add low-dose metoprolol succinate.  Maintain net even fluid balance pending catheterization.  Defer adding ACE inhibitor/ARB until we are certain that renal function has remained stable.  Chronic kidney disease stage IIIa: Renal function stable.  Notably, Mr. Wisler received 100 mL of IV contrast for CTA last night.  Mr. Branan should maintain a net even fluid balance.   Nephrotoxic agents will be avoided.  Shared Decision Making/Informed Consent The risks [facility]  pain, shortness of breath, cardiac arrhythmias, dizziness, blood pressure fluctuations, myocardial infarction, stroke/transient ischemic attack, nausea, vomiting, allergic reaction, radiation exposure, metallic taste sensation and life-threatening complications (estimated to be 1 in 10,000)], benefits (risk stratification, diagnosing coronary artery disease, treatment guidance) and alternatives of a nuclear stress test were discussed in detail with Mr. Nylund and he agrees to proceed.     For questions or updates, please contact [place] Please consult [email or web address] for contact info under [facility] Card[facility].   Signed, Nelva Bush, MD  11/16/2020 1:58 PM

## 2020-11-16 NOTE — ED Notes (Addendum)
Pt transferred to rm 37 on stretcher by this RN. Monitoring cords remain in place. Heparin signed off with primary RN. Hospital bed requested

## 2020-11-16 NOTE — ED Notes (Signed)
Patient is resting comfortably. Heparin infusing without difficulty. NSR continues on the monitor. No complaints at this time

## 2020-11-16 NOTE — Hospital Course (Addendum)
75 y.o. male with medical history significant for essential tremors, hyperlipidemia, BPH, CKD 3A,  MGUS, GERD, presented to the ED with chest pain which started on 6/30 while doing some yard work.  Reported intermittent chest pain brought on with physical activity since onset.  He reports a longer history of significant dyspnea on exertion and intermittent dizziness.  Evaluation in ED with elevated and rising hs-troponin's.  Admitted to [facility] service and started on heparin drip for possible NSTEMI. Cardiology consulted.

## 2020-11-16 NOTE — Progress Notes (Addendum)
ANTICOAGULATION CONSULT NOTE  Pharmacy Consult for Heparin  Indication: chest pain/ACS  No Known Allergies  Patient Measurements: Height: 5\' 11"  (180.3 cm) Weight: 95.3 kg (210 lb) IBW/kg (Calculated) : 75.3 Heparin Dosing Weight: 94.5 kg   Vital Signs: BP: 126/76 (07/05 0630) Pulse Rate: 65 (07/05 0630)  Labs: Recent Labs    11/15/20 1755 11/15/20 1800 11/15/20 2035 11/15/20 2309 11/16/20 0622  HGB 15.7  --   --   --  14.8  HCT 48.1  --   --   --  45.2  PLT 191  --   --   --  172  APTT  --  33  --   --   --   LABPROT  --  13.3  --   --   --   INR  --  1.0  --   --   --   HEPARINUNFRC  --   --   --   --  0.78*  CREATININE 1.47*  --   --   --  1.34*  TROPONINIHS 28*  --  50* 86*  --      Estimated Creatinine Clearance: 55.3 mL/min (A) (by C-G formula based on SCr of 1.34 mg/dL (H)).   Medical History: Past Medical History:  Diagnosis Date   Anxiety    BPH (benign prostatic hyperplasia)    Cataract    bilateral- Dec 2021 and jan 2022   GERD (gastroesophageal reflux disease)    Hypercholesterolemia    Hyperlipidemia    Hypertension     Medications:  (Not in a hospital admission)  Assessment: Pharmacy consulted to dose heparin in this 75 year old male admitted with ACS/NSTEMI.  No prior anticoag noted. CrCl = 55.3 ml/min   07/05 0622 HL 0.78 @ 1300 units/hr  Goal of Therapy:  Heparin level 0.3-0.7 units/ml Monitor platelets by anticoagulation protocol: Yes   Plan:  Will decrease rate to 1200 units/hr Will recheck heparin level 8 hours after rate change CBC's daily on heparin drip per protocol  Lu Duffel, PharmD, BCPS Clinical Pharmacist 11/16/2020 7:19 AM

## 2020-11-17 ENCOUNTER — Encounter: Payer: Self-pay | Admitting: Internal Medicine

## 2020-11-17 ENCOUNTER — Inpatient Hospital Stay (HOSPITAL_COMMUNITY): Payer: Medicare PPO

## 2020-11-17 ENCOUNTER — Other Ambulatory Visit: Payer: Self-pay | Admitting: Radiology

## 2020-11-17 DIAGNOSIS — R072 Precordial pain: Secondary | ICD-10-CM | POA: Diagnosis not present

## 2020-11-17 DIAGNOSIS — I429 Cardiomyopathy, unspecified: Secondary | ICD-10-CM | POA: Diagnosis not present

## 2020-11-17 DIAGNOSIS — E785 Hyperlipidemia, unspecified: Secondary | ICD-10-CM

## 2020-11-17 DIAGNOSIS — R918 Other nonspecific abnormal finding of lung field: Secondary | ICD-10-CM

## 2020-11-17 DIAGNOSIS — R778 Other specified abnormalities of plasma proteins: Secondary | ICD-10-CM | POA: Diagnosis not present

## 2020-11-17 DIAGNOSIS — N1831 Chronic kidney disease, stage 3a: Secondary | ICD-10-CM | POA: Diagnosis not present

## 2020-11-17 LAB — BASIC METABOLIC PANEL
Anion gap: 3 — ABNORMAL LOW (ref 5–15)
BUN: 21 mg/dL (ref 8–23)
CO2: 29 mmol/L (ref 22–32)
Calcium: 8.6 mg/dL — ABNORMAL LOW (ref 8.9–10.3)
Chloride: 100 mmol/L (ref 98–111)
Creatinine, Ser: 1.32 mg/dL — ABNORMAL HIGH (ref 0.61–1.24)
GFR, Estimated: 56 mL/min — ABNORMAL LOW (ref >60)
Glucose, Bld: 101 mg/dL — ABNORMAL HIGH (ref 70–99)
Potassium: 4.6 mmol/L (ref 3.5–5.1)
Sodium: 132 mmol/L — ABNORMAL LOW (ref 135–145)

## 2020-11-17 LAB — NM MYOCAR MULTI W/SPECT W/WALL MOTION / EF
Estimated workload: 1 METS
Exercise duration (min): 0 min
Exercise duration (sec): 0 s
LV dias vol: 65 mL (ref 62–150)
LV sys vol: 18 mL
MPHR: 144 {beats}/min
Peak HR: 99 {beats}/min
Percent HR: 68 %
Rest HR: 73 {beats}/min
SDS: 5
SRS: 12
SSS: 11
TID: 0.82

## 2020-11-17 LAB — CBC
HCT: 43 % (ref 39.0–52.0)
Hemoglobin: 14 g/dL (ref 13.0–17.0)
MCH: 31 pg (ref 26.0–34.0)
MCHC: 32.6 g/dL (ref 30.0–36.0)
MCV: 95.3 fL (ref 80.0–100.0)
Platelets: 179 10*3/uL (ref 150–400)
RBC: 4.51 MIL/uL (ref 4.22–5.81)
RDW: 13.1 % (ref 11.5–15.5)
WBC: 7.9 10*3/uL (ref 4.0–10.5)
nRBC: 0 % (ref 0.0–0.2)

## 2020-11-17 LAB — MAGNESIUM: Magnesium: 1.9 mg/dL (ref 1.7–2.4)

## 2020-11-17 LAB — HEPARIN LEVEL (UNFRACTIONATED)
Heparin Unfractionated: 0.53 IU/mL (ref 0.30–0.70)
Heparin Unfractionated: 0.43 IU/mL (ref 0.30–0.70)

## 2020-11-17 MED ORDER — TECHNETIUM TC 99M TETROFOSMIN IV KIT
10.1000 | PACK | Freq: Once | INTRAVENOUS | Status: AC | PRN
  Administered 2020-11-17: 10.1 via INTRAVENOUS

## 2020-11-17 MED ORDER — TECHNETIUM TC 99M TETROFOSMIN IV KIT
30.0000 | PACK | Freq: Once | INTRAVENOUS | Status: AC | PRN
  Administered 2020-11-17: 30.92 via INTRAVENOUS

## 2020-11-17 MED ORDER — ISOSORBIDE MONONITRATE ER 30 MG PO TB24: 30.0000 mg | ORAL_TABLET | Freq: Every day | ORAL | Status: DC

## 2020-11-17 MED ORDER — HEPARIN (PORCINE) 25000 UT/250ML-% IV SOLN
1050.0000 [IU]/h | INTRAVENOUS | Status: DC
  Administered 2020-11-17: 1050 [IU]/h via INTRAVENOUS
  Filled 2020-11-17: qty 250

## 2020-11-17 MED ORDER — PANTOPRAZOLE SODIUM 40 MG PO TBEC
40.0000 mg | DELAYED_RELEASE_TABLET | Freq: Every day | ORAL | Status: DC
  Administered 2020-11-17 – 2020-11-18 (×2): 40 mg via ORAL
  Filled 2020-11-17 (×2): qty 1

## 2020-11-17 MED ORDER — REGADENOSON 0.4 MG/5ML IV SOLN
0.4000 mg | Freq: Once | INTRAVENOUS | Status: AC
  Administered 2020-11-17: 0.4 mg via INTRAVENOUS

## 2020-11-17 MED ORDER — ATORVASTATIN CALCIUM 20 MG PO TABS
40.0000 mg | ORAL_TABLET | Freq: Every day | ORAL | Status: DC
  Administered 2020-11-17: 40 mg via ORAL
  Filled 2020-11-17: qty 2

## 2020-11-17 NOTE — Progress Notes (Addendum)
ANTICOAGULATION CONSULT NOTE  Pharmacy Consult for Heparin  Indication: chest pain/ACS  No Known Allergies  Patient Measurements: Height: 5\' 11"  (180.3 cm) Weight: 95.8 kg (211 lb 1.6 oz) IBW/kg (Calculated) : 75.3 Heparin Dosing Weight: 94.5 kg   Vital Signs: Temp: 97.4 F (36.3 C) (07/06 1746) Temp Source: Oral (07/06 1746) BP: 120/68 (07/06 1746) Pulse Rate: 67 (07/06 1746)  Labs: Recent Labs    11/15/20 1755 11/15/20 1755 11/15/20 1800 11/15/20 2035 11/15/20 2309 11/16/20 0622 11/16/20 1601 11/17/20 0051 11/17/20 1707  HGB 15.7  --   --   --   --  14.8  --  14.0  --   HCT 48.1  --   --   --   --  45.2  --  43.0  --   PLT 191  --   --   --   --  172  --  179  --   APTT  --   --  33  --   --   --   --   --   --   LABPROT  --   --  13.3  --   --   --   --   --   --   INR  --   --  1.0  --   --   --   --   --   --   HEPARINUNFRC  --    < >  --   --   --  0.78* 0.73* 0.53 0.43  CREATININE 1.47*  --   --   --   --  1.34*  --  1.32*  --   TROPONINIHS 28*  --   --  50* 86* 61*  --   --   --    < > = values in this interval not displayed.     Estimated Creatinine Clearance: 56.2 mL/min (A) (by C-G formula based on SCr of 1.32 mg/dL (H)).   Medical History: Past Medical History:  Diagnosis Date   Anxiety    BPH (benign prostatic hyperplasia)    Cataract    bilateral- Dec 2021 and jan 2022   GERD (gastroesophageal reflux disease)    Hypercholesterolemia    Hyperlipidemia    Hypertension     Medications:  Medications Prior to Admission  Medication Sig Dispense Refill Last Dose   Bacillus Coagulans-Inulin (PROBIOTIC) 1-250 BILLION-MG CAPS Take 1 capsule by mouth daily.   11/15/2020 at 1300   Cyanocobalamin (VITAMIN B 12 PO) Take 1,000 mcg by mouth daily.   11/15/2020 at 1300   neomycin-bacitracin-polymyxin (NEOSPORIN) ointment Apply 1 application topically every 12 (twelve) hours.   PRN at prn   primidone (MYSOLINE) 50 MG tablet Take 50 mg by mouth at bedtime.    11/15/2020 at 2100   Propylene Glycol (SYSTANE COMPLETE OP) Place 1 drop into both eyes in the morning, at noon, and at bedtime.   11/15/2020 at 2100   simvastatin (ZOCOR) 20 MG tablet Take 20 mg by mouth daily.   11/15/2020 at 0800   tamsulosin (FLOMAX) 0.4 MG CAPS capsule Take 0.4 mg by mouth daily. At lunchtime   11/15/2020 at 1300    Assessment: Pharmacy consulted to dose heparin in this 75 year old male admitted with ACS/NSTEMI.  No prior anticoag noted. CrCl = 55.3 ml/min   07/05 0622 HL 0.78 @ 1300 units/hr 07/05 1604 HL 0.73 @ 1200 units/hr 07/06 0051 HL 0.53 @ 1050 units/hr; thera x1 07/06 1707 HL  0.43 @ 1050 units/hr; thera x2  Goal of Therapy:  Heparin level 0.3-0.7 units/ml Monitor platelets by anticoagulation protocol: Yes   Plan:  Therapeutic x2 7/6:  HL @ 1707 = 0.43 Will continue pt on current rate and check daily levels with CBC on AM labs.   Lorna Dibble, PharmD Clinical Pharmacist 11/17/2020 5:52 PM

## 2020-11-17 NOTE — Progress Notes (Signed)
Patient ID: Steve Warren, male   DOB: [birth year], 75 y.o.   MRN: [id number] [facility] PROGRESS NOTE  Steve Warren ZDG:[id number] DOB: May 05, 1946 DOA: 11/15/2020 PCP: Sofie Hartigan, MD  HPI/Subjective: Patient seen this morning and he was feeling okay.  No chest pain or shortness of breath.  Patient feel so okay currently.  Admitted with NSTEMI but stress test negative.  Objective: Vitals:   11/17/20 0721 11/17/20 1047  BP: 130/79 123/74  Pulse: 69 83  Resp:    Temp: 98.1 F (36.7 C) 98.3 F (36.8 C)  SpO2: 98% 98%    Intake/Output Summary (Last 24 hours) at 11/17/2020 1546 Last data filed at 11/17/2020 1506 Gross per 24 hour  Intake 1077.3 ml  Output 850 ml  Net 227.3 ml   Filed Weights   11/15/20 1750 11/16/20 1419  Weight: 95.3 kg 95.8 kg    ROS: Review of Systems  Respiratory:  Negative for shortness of breath.   Cardiovascular:  Negative for chest pain.  Gastrointestinal:  Negative for abdominal pain, nausea and vomiting.  Exam: Physical Exam HENT:     Head: Normocephalic.     Mouth/Throat:     Pharynx: No oropharyngeal exudate.  Eyes:     General: Lids are normal.     Conjunctiva/sclera: Conjunctivae normal.     Pupils: Pupils are equal, round, and reactive to light.  Cardiovascular:     Rate and Rhythm: Normal rate and regular rhythm.     Heart sounds: Normal heart sounds, S1 normal and S2 normal.  Pulmonary:     Breath sounds: No decreased breath sounds, wheezing, rhonchi or rales.  Abdominal:     Palpations: Abdomen is soft.     Tenderness: There is no abdominal tenderness.  Musculoskeletal:     Right ankle: No swelling.     Left ankle: No swelling.  Skin:    General: Skin is warm.     Findings: No rash.  Neurological:     Mental Status: He is alert and oriented to person, place, and time.     Data Reviewed: Basic Metabolic Panel: Recent Labs  Lab 11/13/20 0319 11/15/20 1755 11/16/20 0622 11/17/20 0051  NA 136 134* 135 132*   K 5.3* 4.1 4.7 4.6  CL 102 100 100 100  CO2 28 30 29 29   GLUCOSE 114* 100* 99 101*  BUN 20 21 20 21   CREATININE 1.21 1.47* 1.34* 1.32*  CALCIUM 8.8* 10.0 9.3 8.6*  MG  --   --   --  1.9    CBC: Recent Labs  Lab 11/13/20 0319 11/15/20 1755 11/16/20 0622 11/17/20 0051  WBC 6.1 6.6 7.7 7.9  HGB 14.9 15.7 14.8 14.0  HCT 45.4 48.1 45.2 43.0  MCV 95.2 95.6 94.6 95.3  PLT 163 191 172 179    BNP (last 3 results) Recent Labs    11/15/20 1800  BNP 112.5*     Recent Results (from the past 240 hour(s))  Resp Panel by RT-PCR (Flu A&B, Covid) Nasopharyngeal Swab     Status: None   Collection Time: 11/15/20 11:09 PM   Specimen: Nasopharyngeal Swab; Nasopharyngeal(NP) swabs in vial transport medium  Result Value Ref Range Status   SARS Coronavirus 2 by RT PCR NEGATIVE NEGATIVE Final    Comment: (NOTE) SARS-CoV-2 target nucleic acids are NOT DETECTED.  The SARS-CoV-2 RNA is generally detectable in upper respiratory specimens during the acute phase of infection. The lowest concentration of SARS-CoV-2 viral copies this assay can  detect is 138 copies/mL. A negative result does not preclude SARS-Cov-2 infection and should not be used as the sole basis for treatment or other patient management decisions. A negative result may occur with  improper specimen collection/handling, submission of specimen other than nasopharyngeal swab, presence of viral mutation(s) within the areas targeted by this assay, and inadequate number of viral copies(<138 copies/mL). A negative result must be combined with clinical observations, patient history, and epidemiological information. The expected result is Negative.  Fact Sheet for Patients:  EntrepreneurPulse.com.au  Fact Sheet for Healthcare Providers:  IncredibleEmployment.be  This test is no t yet approved or cleared by the [place] FDA and  has been authorized for detection and/or diagnosis of  SARS-CoV-2 by FDA under an Emergency Use Authorization (EUA). This EUA will remain  in effect (meaning this test can be used) for the duration of the COVID-19 declaration under Section 564(b)(1) of the Act, 21 U.S.C.section 360bbb-3(b)(1), unless the authorization is terminated  or revoked sooner.       Influenza A by PCR NEGATIVE NEGATIVE Final   Influenza B by PCR NEGATIVE NEGATIVE Final    Comment: (NOTE) The Xpert Xpress SARS-CoV-2/FLU/RSV plus assay is intended as an aid in the diagnosis of influenza from Nasopharyngeal swab specimens and should not be used as a sole basis for treatment. Nasal washings and aspirates are unacceptable for Xpert Xpress SARS-CoV-2/FLU/RSV testing.  Fact Sheet for Patients: EntrepreneurPulse.com.au  Fact Sheet for Healthcare Providers: IncredibleEmployment.be  This test is not yet approved or cleared by the [place] FDA and has been authorized for detection and/or diagnosis of SARS-CoV-2 by FDA under an Emergency Use Authorization (EUA). This EUA will remain in effect (meaning this test can be used) for the duration of the COVID-19 declaration under Section 564(b)(1) of the Act, 21 U.S.C. section 360bbb-3(b)(1), unless the authorization is terminated or revoked.  Performed at [facility], [address], [place], [place] [address]      Studies: DG Chest 2 View  Result Date: 11/15/2020 CLINICAL DATA:  Chest pain. EXAM: CHEST - 2 VIEW COMPARISON:  Radiograph 2 days ago 11/13/2020.  Chest CT 10/14/2019 FINDINGS: Low lung volumes persist. No acute airspace disease. Stable heart size and mediastinal contours. Right middle lobe pulmonary nodule stable dating back to 2021 CT. Known left lung pulmonary nodule is tentatively seen. No pulmonary edema, pleural effusion, or pneumothorax. No acute osseous abnormalities are seen. IMPRESSION: Low lung volumes without acute abnormality. Electronically Signed    By: Steve Warren M.D.   On: 11/15/2020 18:27   NM Myocar Multi W/Spect W/Wall Motion / EF  Result Date: 11/17/2020  The study is normal.  This is a low risk study.  The left ventricular ejection fraction is hyperdynamic (>65%).  There is no evidence for ischemia    ECHOCARDIOGRAM COMPLETE  Result Date: 11/16/2020    ECHOCARDIOGRAM REPORT   Patient Name:   ARYAN SPARKS Date of Exam: 11/16/2020 Medical Rec #:  [id number]       Height:       71.0 in Accession #:    [id number]      Weight:       210.0 lb Date of Birth:  15-Feb-1946       BSA:          2.153 m Patient Age:    30 years        BP:           170/85 mmHg Patient Gender: M  HR:           67 bpm. Exam Location:  [facility] Procedure: 2D Echo, Cardiac Doppler and Color Doppler Indications:     Chest pain R07.9                  Elevated troponin  History:         Patient has no prior history of Echocardiogram examinations.                  Risk Factors:Dyslipidemia and Hypertension.  Sonographer:     Sherrie Sport RDCS (AE) Referring Phys:  [id number] AMY N COX Diagnosing Phys: [name] End MD  Sonographer Comments: No parasternal window and suboptimal apical window. IMPRESSIONS  1. Left ventricular ejection fraction, by estimation, is 45 to 50%. The left ventricle has mildly decreased function. The left ventricle demonstrates regional wall motion abnormalities (see scoring diagram/findings for description). There is mild left ventricular hypertrophy. Left ventricular diastolic parameters are consistent with Grade I diastolic dysfunction (impaired relaxation). There is dyskinesis of the left ventricular, basal-mid inferolateral wall and inferior wall.  2. Right ventricular systolic function is normal. The right ventricular size is normal.  3. The mitral valve was not well visualized. Trivial mitral valve regurgitation.  4. The aortic valve was not well visualized. Aortic valve regurgitation is not visualized. No aortic stenosis is present.  FINDINGS  Left Ventricle: Left ventricular ejection fraction, by estimation, is 45 to 50%. The left ventricle has mildly decreased function. The left ventricle demonstrates regional wall motion abnormalities. The left ventricular internal cavity size was normal in size. There is mild left ventricular hypertrophy. Left ventricular diastolic parameters are consistent with Grade I diastolic dysfunction (impaired relaxation). Right Ventricle: The right ventricular size is normal. Right vetricular wall thickness was not well visualized. Right ventricular systolic function is normal. Left Atrium: Left atrial size was normal in size. Right Atrium: Right atrial size was normal in size. Pericardium: The pericardium was not well visualized. Mitral Valve: The mitral valve was not well visualized. There is mild thickening of the mitral valve leaflet(s). There is mild calcification of the mitral valve leaflet(s). Trivial mitral valve regurgitation. Tricuspid Valve: The tricuspid valve is not well visualized. Tricuspid valve regurgitation is trivial. Aortic Valve: The aortic valve was not well visualized. Aortic valve regurgitation is not visualized. No aortic stenosis is present. Aortic valve mean gradient measures 2.0 mmHg. Aortic valve peak gradient measures 3.5 mmHg. Aortic valve area, by VTI measures 3.58 cm. Pulmonic Valve: The pulmonic valve was not well visualized. Aorta: The aortic root is normal in size and structure. IAS/Shunts: The interatrial septum was not well visualized.  LEFT VENTRICLE PLAX 2D LVIDd:         3.70 cm     Diastology LVIDs:         2.82 cm     LV e' medial:    5.00 cm/s LV PW:         1.12 cm     LV E/e' medial:  9.3 LV IVS:        0.94 cm     LV e' lateral:   5.77 cm/s LVOT diam:     2.10 cm     LV E/e' lateral: 8.1 LV SV:         52 LV SV Index:   24 LVOT Area:     3.46 cm  LV Volumes (MOD) LV vol d, MOD A4C: 44.9 ml LV vol s, MOD A4C: 38.0  ml LV SV MOD A4C:     44.9 ml RIGHT VENTRICLE RV Basal  diam:  2.71 cm RV S prime:     13.90 cm/s TAPSE (M-mode): 3.4 cm LEFT ATRIUM             Index       RIGHT ATRIUM          Index LA diam:        3.70 cm 1.72 cm/m  RA Area:     9.50 cm LA Vol (A2C):   37.5 ml 17.42 ml/m RA Volume:   18.80 ml 8.73 ml/m LA Vol (A4C):   37.2 ml 17.28 ml/m LA Biplane Vol: 41.4 ml 19.23 ml/m  AORTIC VALVE AV Area (Vmax):    2.84 cm AV Area (Vmean):   2.69 cm AV Area (VTI):     3.58 cm AV Vmax:           93.30 cm/s AV Vmean:          62.100 cm/s AV VTI:            0.145 m AV Peak Grad:      3.5 mmHg AV Mean Grad:      2.0 mmHg LVOT Vmax:         76.40 cm/s LVOT Vmean:        48.300 cm/s LVOT VTI:          0.150 m LVOT/AV VTI ratio: 1.03  AORTA Ao Root diam: 3.43 cm MITRAL VALVE               TRICUSPID VALVE MV Area (PHT): 2.18 cm    TR Peak grad:   8.9 mmHg MV Decel Time: 348 msec    TR Vmax:        149.00 cm/s MV E velocity: 46.70 cm/s MV A velocity: 73.30 cm/s  SHUNTS MV E/A ratio:  0.64        Systemic VTI:  0.15 m                            Systemic Diam: 2.10 cm Nelva Bush MD Electronically signed by Nelva Bush MD Signature Date/Time: 11/16/2020/12:50:09 PM    Final    CT Angio Chest/Abd/Pel for Dissection W and/or Wo Contrast  Result Date: 11/15/2020 CLINICAL DATA:  Chest pain. EXAM: CT ANGIOGRAPHY CHEST, ABDOMEN AND PELVIS TECHNIQUE: Non-contrast CT of the chest was initially obtained. Multidetector CT imaging through the chest, abdomen and pelvis was performed using the standard protocol during bolus administration of intravenous contrast. Multiplanar reconstructed images and MIPs were obtained and reviewed to evaluate the vascular anatomy. CONTRAST:  182mL OMNIPAQUE IOHEXOL 350 MG/ML SOLN COMPARISON:  CT chest 10/14/2019, CT abdomen pelvis 05/24/2017 FINDINGS: CTA CHEST FINDINGS Cardiovascular: Preferential opacification of the thoracic aorta. No evidence of thoracic aortic aneurysm or dissection. Mild atherosclerotic plaque. At least 2 vessel coronary artery  calcifications. Normal heart size. No pericardial effusion. The main pulmonary artery is normal in caliber. No central or segmental pulmonary embolus. Mediastinum/Nodes: No enlarged mediastinal, hilar, or axillary lymph nodes. Thyroid gland, trachea, and esophagus demonstrate no significant findings. Lungs/Pleura: Stable 1.5 cm right upper lobe pulmonary nodule along the minor fissure. Stable 0.7 cm left lower lobe pulmonary nodule. Stable left lower lobe pulmonary micronodule (7:83). No new pulmonary nodule. No focal consolidation. No pleural effusion. No pneumothorax. Musculoskeletal: No chest wall abnormality. No suspicious lytic or blastic osseous lesions. No acute displaced fracture. Multilevel  degenerative changes of the spine. Review of the MIP images confirms the above findings. CTA ABDOMEN AND PELVIS FINDINGS VASCULAR Aorta: Mild to moderate calcified and noncalcified atherosclerotic plaque. Normal caliber aorta without aneurysm, dissection, vasculitis or significant stenosis. Celiac: Patent without evidence of aneurysm, dissection, vasculitis or significant stenosis. SMA: Patent without evidence of aneurysm, dissection, vasculitis or significant stenosis. Renals: Duplicated right renal arteries. Both renal arteries are patent without evidence of aneurysm, dissection, vasculitis, fibromuscular dysplasia or significant stenosis. IMA: Patent without evidence of aneurysm, dissection, vasculitis or significant stenosis. Inflow: Mild atherosclerotic plaque. Patent without evidence of aneurysm, dissection, vasculitis or significant stenosis. Veins: No obvious venous abnormality within the limitations of this arterial phase study. Review of the MIP images confirms the above findings. NON-VASCULAR Hepatobiliary: Slightly increased in size fluid density 3.7 cm lesion within the left hepatic lobe that likely represents a simple cyst. Subcentimeter hypodensities are too small to characterize. No gallstones,  gallbladder wall thickening, or pericholecystic fluid. No biliary dilatation. Pancreas: No focal lesion. Normal pancreatic contour. No surrounding inflammatory changes. No main pancreatic ductal dilatation. Spleen: Normal in size without focal abnormality. Adrenals/Urinary Tract: No adrenal nodule bilaterally. Bilateral kidneys enhance symmetrically. No hydronephrosis. No hydroureter. The urinary bladder is unremarkable. Stomach/Bowel: Stomach is within normal limits. No evidence of bowel wall thickening or dilatation. Diffuse left colon and sigmoid diverticulosis. The appendix is not definitely identified. Lymphatic: No lymphadenopathy. Reproductive: Enlarged prostate measuring up to 5.7 cm. Other: CT body other Musculoskeletal: Tiny fat containing left inguinal hernia. Couple of tiny fat containing umbilical hernias (28:78, 55; 5:181, 186). No suspicious lytic or blastic osseous lesions. No acute displaced fracture. Multilevel degenerative changes of the spine with severe left L4-L5 osseous neural foraminal stenosis. Review of the MIP images confirms the above findings. IMPRESSION: 1. No acute vascular abnormality. Aortic Atherosclerosis (ICD10-I70.0) including at least 2 vessel coronary artery calcifications. 2. Stable pulmonary nodules.  No new pulmonary nodules. 3. Colonic diverticulosis with no acute diverticulitis. 4. 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 Electronically Signed   By: Iven Finn M.D.   On: 11/15/2020 21:31    Scheduled Meds:  acidophilus  1 capsule Oral Daily   aspirin EC  81 mg Oral Daily   atorvastatin  40 mg Oral Daily   metoprolol succinate  12.5 mg Oral Daily   pantoprazole  40 mg Oral Daily   primidone  50 mg Oral q AM   tamsulosin  0.4 mg Oral Daily   vitamin B-12  1,000 mcg Oral Daily   Continuous Infusions:  heparin      Assessment/Plan:  Chest pain with minimally elevated troponins.   Lexiscan Myoview no evidence for ischemia.  Echo did have a lower than normal ejection fraction of 45 to 50%.  Cardiology recommending continuing heparin drip to complete 48-hour course and likely discharge tomorrow.  Continue aspirin, Toprol and Lipitor.  LDL 77 Cardiomyopathy with an EF of 45 to 50%.  With recent dye study holding off on ACE or ARB right now. Chronic kidney disease stage IIIa.  Creatinine 1.32 today with a GFR 56.  Check creatinine again tomorrow morning. Essential tremor on primidone Hyperlipidemia unspecified.  Zocor switched over to Lipitor BPH on Flomax Pulmonary nodules outpatient follow.  Radiologist commented that this are stable pulmonary nodules        Code Status:     Code Status Orders  (From admission, onward)  Start     Ordered   11/15/20 2227  Full code  Continuous        11/15/20 2227           Code Status History     This patient has a current code status but no historical code status.      Family Communication: Spoke with wife on the phone Disposition Plan: Status is: Inpatient  Dispo: The patient is from: Home              Anticipated d/c is to: Home              Patient currently cardiology recommends 48 hours of heparin drip prior to discharge.   Difficult to place patient.  No.  Time spent: 28 minute  [place]

## 2020-11-17 NOTE — Progress Notes (Signed)
ANTICOAGULATION CONSULT NOTE  Pharmacy Consult for Heparin  Indication: chest pain/ACS  No Known Allergies  Patient Measurements: Height: 5\' 11"  (180.3 cm) Weight: 95.8 kg (211 lb 1.6 oz) IBW/kg (Calculated) : 75.3 Heparin Dosing Weight: 94.5 kg   Vital Signs: Temp: 97.9 F (36.6 C) (07/05 1923) Temp Source: Oral (07/05 1923) BP: 118/63 (07/05 1923) Pulse Rate: 73 (07/05 1923)  Labs: Recent Labs    11/15/20 1755 11/15/20 1800 11/15/20 2035 11/15/20 2309 11/16/20 0622 11/16/20 1601 11/17/20 0051  HGB 15.7  --   --   --  14.8  --  14.0  HCT 48.1  --   --   --  45.2  --  43.0  PLT 191  --   --   --  172  --  179  APTT  --  33  --   --   --   --   --   LABPROT  --  13.3  --   --   --   --   --   INR  --  1.0  --   --   --   --   --   HEPARINUNFRC  --   --   --   --  0.78* 0.73* 0.53  CREATININE 1.47*  --   --   --  1.34*  --  1.32*  TROPONINIHS 28*  --  50* 86* 61*  --   --      Estimated Creatinine Clearance: 56.2 mL/min (A) (by C-G formula based on SCr of 1.32 mg/dL (H)).   Medical History: Past Medical History:  Diagnosis Date   Anxiety    BPH (benign prostatic hyperplasia)    Cataract    bilateral- Dec 2021 and jan 2022   GERD (gastroesophageal reflux disease)    Hypercholesterolemia    Hyperlipidemia    Hypertension     Medications:  Medications Prior to Admission  Medication Sig Dispense Refill Last Dose   Bacillus Coagulans-Inulin (PROBIOTIC) 1-250 BILLION-MG CAPS Take 1 capsule by mouth daily.   11/15/2020 at 1300   Cyanocobalamin (VITAMIN B 12 PO) Take 1,000 mcg by mouth daily.   11/15/2020 at 1300   neomycin-bacitracin-polymyxin (NEOSPORIN) ointment Apply 1 application topically every 12 (twelve) hours.   PRN at prn   primidone (MYSOLINE) 50 MG tablet Take 50 mg by mouth at bedtime.   11/15/2020 at 2100   Propylene Glycol (SYSTANE COMPLETE OP) Place 1 drop into both eyes in the morning, at noon, and at bedtime.   11/15/2020 at 2100   simvastatin (ZOCOR)  20 MG tablet Take 20 mg by mouth daily.   11/15/2020 at 0800   tamsulosin (FLOMAX) 0.4 MG CAPS capsule Take 0.4 mg by mouth daily. At lunchtime   11/15/2020 at 1300    Assessment: Pharmacy consulted to dose heparin in this 75 year old male admitted with ACS/NSTEMI.  No prior anticoag noted. CrCl = 55.3 ml/min   07/05 0622 HL 0.78 @ 1300 units/hr 07/05 1604 HL 0.73 @ 1200 units/hr 07/06 0051 HL 0.53 @ 1050 units/hr  Goal of Therapy:  Heparin level 0.3-0.7 units/ml Monitor platelets by anticoagulation protocol: Yes   Plan:  7/6:  HL @ 6283 = 0.53 Will continue pt on current rate and draw confirmation level on 7/6 @ 0900.   Orene Desanctis, PharmD Clinical Pharmacist 11/17/2020 1:30 AM

## 2020-11-17 NOTE — Progress Notes (Signed)
Progress Note  Patient Name: TALLIN HART Date of Encounter: 11/17/2020  [facility] Cardiologist: Dr. Saunders Revel  Subjective   Feels well, denies chest pain or shortness of breath.  Just came back from obtaining stress test.  Has not ambulated much today.  Inpatient Medications    Scheduled Meds:  acidophilus  1 capsule Oral Daily   aspirin EC  81 mg Oral Daily   atorvastatin  40 mg Oral Daily   metoprolol succinate  12.5 mg Oral Daily   pantoprazole  40 mg Oral Daily   primidone  50 mg Oral q AM   tamsulosin  0.4 mg Oral Daily   vitamin B-12  1,000 mcg Oral Daily   Continuous Infusions:  heparin     PRN Meds: acetaminophen, morphine injection, ondansetron (ZOFRAN) IV   Vital Signs    Vitals:   11/16/20 1923 11/17/20 0443 11/17/20 0721 11/17/20 1047  BP: 118/63 (!) 142/91 130/79 123/74  Pulse: 73 69 69 83  Resp: 18 18    Temp: 97.9 F (36.6 C) 97.6 F (36.4 C) 98.1 F (36.7 C) 98.3 F (36.8 C)  TempSrc: Oral Oral Oral   SpO2: 98% 97% 98% 98%  Weight:      Height:        Intake/Output Summary (Last 24 hours) at 11/17/2020 1543 Last data filed at 11/17/2020 1506 Gross per 24 hour  Intake 1077.3 ml  Output 850 ml  Net 227.3 ml   Last 3 Weights 11/16/2020 11/15/2020 11/13/2020  Weight (lbs) 211 lb 1.6 oz 210 lb 210 lb  Weight (kg) 95.754 kg 95.255 kg 95.255 kg      Telemetry    Sinus rhythm- Personally Reviewed  ECG    No new tracing obtained- Personally Reviewed  Physical Exam   GEN: No acute distress.   Neck: No JVD Cardiac: RRR, no murmurs, rubs, or gallops.  Respiratory: Clear to auscultation bilaterally. GI: Soft, nontender, non-distended  MS: No edema; No deformity. Neuro:  Nonfocal  Psych: Normal affect   Labs    High Sensitivity Troponin:   Recent Labs  Lab 11/13/20 0625 11/15/20 1755 11/15/20 2035 11/15/20 2309 11/16/20 0622  TROPONINIHS 14 28* 50* 86* 61*      Chemistry Recent Labs  Lab 11/15/20 1755 11/16/20 0622  11/17/20 0051  NA 134* 135 132*  K 4.1 4.7 4.6  CL 100 100 100  CO2 30 29 29   GLUCOSE 100* 99 101*  BUN 21 20 21   CREATININE 1.47* 1.34* 1.32*  CALCIUM 10.0 9.3 8.6*  GFRNONAA 49* 55* 56*  ANIONGAP 4* 6 3*     Hematology Recent Labs  Lab 11/15/20 1755 11/16/20 0622 11/17/20 0051  WBC 6.6 7.7 7.9  RBC 5.03 4.78 4.51  HGB 15.7 14.8 14.0  HCT 48.1 45.2 43.0  MCV 95.6 94.6 95.3  MCH 31.2 31.0 31.0  MCHC 32.6 32.7 32.6  RDW 13.1 13.1 13.1  PLT 191 172 179    BNP Recent Labs  Lab 11/15/20 1800  BNP 112.5*     DDimer No results for input(s): DDIMER in the last 168 hours.   Radiology    DG Chest 2 View  Result Date: 11/15/2020 CLINICAL DATA:  Chest pain. EXAM: CHEST - 2 VIEW COMPARISON:  Radiograph 2 days ago 11/13/2020.  Chest CT 10/14/2019 FINDINGS: Low lung volumes persist. No acute airspace disease. Stable heart size and mediastinal contours. Right middle lobe pulmonary nodule stable dating back to 2021 CT. Known left lung pulmonary nodule  is tentatively seen. No pulmonary edema, pleural effusion, or pneumothorax. No acute osseous abnormalities are seen. IMPRESSION: Low lung volumes without acute abnormality. Electronically Signed   By: Keith Rake M.D.   On: 11/15/2020 18:27   NM Myocar Multi W/Spect W/Wall Motion / EF  Result Date: 11/17/2020  The study is normal.  This is a low risk study.  The left ventricular ejection fraction is hyperdynamic (>65%).  There is no evidence for ischemia    ECHOCARDIOGRAM COMPLETE  Result Date: 11/16/2020    ECHOCARDIOGRAM REPORT   Patient Name:   HENRICK MCGUE Date of Exam: 11/16/2020 Medical Rec #:  [id number]       Height:       71.0 in Accession #:    [id number]      Weight:       210.0 lb Date of Birth:  03-22-1946       BSA:          2.153 m Patient Age:    75 years        BP:           170/85 mmHg Patient Gender: M               HR:           67 bpm. Exam Location:  [facility] Procedure: 2D Echo, Cardiac Doppler and Color  Doppler Indications:     Chest pain R07.9                  Elevated troponin  History:         Patient has no prior history of Echocardiogram examinations.                  Risk Factors:Dyslipidemia and Hypertension.  Sonographer:     Sherrie Sport RDCS (AE) Referring Phys:  [id number] AMY N COX Diagnosing Phys: [name] End MD  Sonographer Comments: No parasternal window and suboptimal apical window. IMPRESSIONS  1. Left ventricular ejection fraction, by estimation, is 45 to 50%. The left ventricle has mildly decreased function. The left ventricle demonstrates regional wall motion abnormalities (see scoring diagram/findings for description). There is mild left ventricular hypertrophy. Left ventricular diastolic parameters are consistent with Grade I diastolic dysfunction (impaired relaxation). There is dyskinesis of the left ventricular, basal-mid inferolateral wall and inferior wall.  2. Right ventricular systolic function is normal. The right ventricular size is normal.  3. The mitral valve was not well visualized. Trivial mitral valve regurgitation.  4. The aortic valve was not well visualized. Aortic valve regurgitation is not visualized. No aortic stenosis is present. FINDINGS  Left Ventricle: Left ventricular ejection fraction, by estimation, is 45 to 50%. The left ventricle has mildly decreased function. The left ventricle demonstrates regional wall motion abnormalities. The left ventricular internal cavity size was normal in size. There is mild left ventricular hypertrophy. Left ventricular diastolic parameters are consistent with Grade I diastolic dysfunction (impaired relaxation). Right Ventricle: The right ventricular size is normal. Right vetricular wall thickness was not well visualized. Right ventricular systolic function is normal. Left Atrium: Left atrial size was normal in size. Right Atrium: Right atrial size was normal in size. Pericardium: The pericardium was not well visualized. Mitral Valve: The  mitral valve was not well visualized. There is mild thickening of the mitral valve leaflet(s). There is mild calcification of the mitral valve leaflet(s). Trivial mitral valve regurgitation. Tricuspid Valve: The tricuspid valve is not well visualized. Tricuspid valve regurgitation is trivial.  Aortic Valve: The aortic valve was not well visualized. Aortic valve regurgitation is not visualized. No aortic stenosis is present. Aortic valve mean gradient measures 2.0 mmHg. Aortic valve peak gradient measures 3.5 mmHg. Aortic valve area, by VTI measures 3.58 cm. Pulmonic Valve: The pulmonic valve was not well visualized. Aorta: The aortic root is normal in size and structure. IAS/Shunts: The interatrial septum was not well visualized.  LEFT VENTRICLE PLAX 2D LVIDd:         3.70 cm     Diastology LVIDs:         2.82 cm     LV e' medial:    5.00 cm/s LV PW:         1.12 cm     LV E/e' medial:  9.3 LV IVS:        0.94 cm     LV e' lateral:   5.77 cm/s LVOT diam:     2.10 cm     LV E/e' lateral: 8.1 LV SV:         52 LV SV Index:   24 LVOT Area:     3.46 cm  LV Volumes (MOD) LV vol d, MOD A4C: 44.9 ml LV vol s, MOD A4C: 38.0 ml LV SV MOD A4C:     44.9 ml RIGHT VENTRICLE RV Basal diam:  2.71 cm RV S prime:     13.90 cm/s TAPSE (M-mode): 3.4 cm LEFT ATRIUM             Index       RIGHT ATRIUM          Index LA diam:        3.70 cm 1.72 cm/m  RA Area:     9.50 cm LA Vol (A2C):   37.5 ml 17.42 ml/m RA Volume:   18.80 ml 8.73 ml/m LA Vol (A4C):   37.2 ml 17.28 ml/m LA Biplane Vol: 41.4 ml 19.23 ml/m  AORTIC VALVE AV Area (Vmax):    2.84 cm AV Area (Vmean):   2.69 cm AV Area (VTI):     3.58 cm AV Vmax:           93.30 cm/s AV Vmean:          62.100 cm/s AV VTI:            0.145 m AV Peak Grad:      3.5 mmHg AV Mean Grad:      2.0 mmHg LVOT Vmax:         76.40 cm/s LVOT Vmean:        48.300 cm/s LVOT VTI:          0.150 m LVOT/AV VTI ratio: 1.03  AORTA Ao Root diam: 3.43 cm MITRAL VALVE               TRICUSPID VALVE MV  Area (PHT): 2.18 cm    TR Peak grad:   8.9 mmHg MV Decel Time: 348 msec    TR Vmax:        149.00 cm/s MV E velocity: 46.70 cm/s MV A velocity: 73.30 cm/s  SHUNTS MV E/A ratio:  0.64        Systemic VTI:  0.15 m                            Systemic Diam: 2.10 cm Nelva Bush MD Electronically signed by Nelva Bush MD Signature Date/Time: 11/16/2020/12:50:09 PM    Final  CT Angio Chest/Abd/Pel for Dissection W and/or Wo Contrast  Result Date: 11/15/2020 CLINICAL DATA:  Chest pain. EXAM: CT ANGIOGRAPHY CHEST, ABDOMEN AND PELVIS TECHNIQUE: Non-contrast CT of the chest was initially obtained. Multidetector CT imaging through the chest, abdomen and pelvis was performed using the standard protocol during bolus administration of intravenous contrast. Multiplanar reconstructed images and MIPs were obtained and reviewed to evaluate the vascular anatomy. CONTRAST:  122mL OMNIPAQUE IOHEXOL 350 MG/ML SOLN COMPARISON:  CT chest 10/14/2019, CT abdomen pelvis 05/24/2017 FINDINGS: CTA CHEST FINDINGS Cardiovascular: Preferential opacification of the thoracic aorta. No evidence of thoracic aortic aneurysm or dissection. Mild atherosclerotic plaque. At least 2 vessel coronary artery calcifications. Normal heart size. No pericardial effusion. The main pulmonary artery is normal in caliber. No central or segmental pulmonary embolus. Mediastinum/Nodes: No enlarged mediastinal, hilar, or axillary lymph nodes. Thyroid gland, trachea, and esophagus demonstrate no significant findings. Lungs/Pleura: Stable 1.5 cm right upper lobe pulmonary nodule along the minor fissure. Stable 0.7 cm left lower lobe pulmonary nodule. Stable left lower lobe pulmonary micronodule (7:83). No new pulmonary nodule. No focal consolidation. No pleural effusion. No pneumothorax. Musculoskeletal: No chest wall abnormality. No suspicious lytic or blastic osseous lesions. No acute displaced fracture. Multilevel degenerative changes of the spine. Review of  the MIP images confirms the above findings. CTA ABDOMEN AND PELVIS FINDINGS VASCULAR Aorta: Mild to moderate calcified and noncalcified atherosclerotic plaque. Normal caliber aorta without aneurysm, dissection, vasculitis or significant stenosis. Celiac: Patent without evidence of aneurysm, dissection, vasculitis or significant stenosis. SMA: Patent without evidence of aneurysm, dissection, vasculitis or significant stenosis. Renals: Duplicated right renal arteries. Both renal arteries are patent without evidence of aneurysm, dissection, vasculitis, fibromuscular dysplasia or significant stenosis. IMA: Patent without evidence of aneurysm, dissection, vasculitis or significant stenosis. Inflow: Mild atherosclerotic plaque. Patent without evidence of aneurysm, dissection, vasculitis or significant stenosis. Veins: No obvious venous abnormality within the limitations of this arterial phase study. Review of the MIP images confirms the above findings. NON-VASCULAR Hepatobiliary: Slightly increased in size fluid density 3.7 cm lesion within the left hepatic lobe that likely represents a simple cyst. Subcentimeter hypodensities are too small to characterize. No gallstones, gallbladder wall thickening, or pericholecystic fluid. No biliary dilatation. Pancreas: No focal lesion. Normal pancreatic contour. No surrounding inflammatory changes. No main pancreatic ductal dilatation. Spleen: Normal in size without focal abnormality. Adrenals/Urinary Tract: No adrenal nodule bilaterally. Bilateral kidneys enhance symmetrically. No hydronephrosis. No hydroureter. The urinary bladder is unremarkable. Stomach/Bowel: Stomach is within normal limits. No evidence of bowel wall thickening or dilatation. Diffuse left colon and sigmoid diverticulosis. The appendix is not definitely identified. Lymphatic: No lymphadenopathy. Reproductive: Enlarged prostate measuring up to 5.7 cm. Other: CT body other Musculoskeletal: Tiny fat containing left  inguinal hernia. Couple of tiny fat containing umbilical hernias (16:07, 55; 5:181, 186). No suspicious lytic or blastic osseous lesions. No acute displaced fracture. Multilevel degenerative changes of the spine with severe left L4-L5 osseous neural foraminal stenosis. Review of the MIP images confirms the above findings. IMPRESSION: 1. No acute vascular abnormality. Aortic Atherosclerosis (ICD10-I70.0) including at least 2 vessel coronary artery calcifications. 2. Stable pulmonary nodules.  No new pulmonary nodules. 3. Colonic diverticulosis with no acute diverticulitis. 4. 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 Electronically Signed   By: Iven Finn M.D.   On: 11/15/2020 21:31    Cardiac Studies   TTEcho 11/16/2020 1. Left ventricular ejection fraction, by estimation, is 45 to 50%.  The  left ventricle has mildly decreased function. The left ventricle  demonstrates regional wall motion abnormalities (see scoring  diagram/findings for description). There is mild left  ventricular hypertrophy. Left ventricular diastolic parameters are  consistent with Grade I diastolic dysfunction (impaired relaxation). There  is dyskinesis of the left ventricular, basal-mid inferolateral wall and  inferior wall.   2. Right ventricular systolic function is normal. The right ventricular  size is normal.   3. The mitral valve was not well visualized. Trivial mitral valve  regurgitation.   4. The aortic valve was not well visualized. Aortic valve regurgitation  is not visualized. No aortic stenosis is present.   Leane Call 11/17/2020 The study is normal. This is a low risk study. The left ventricular ejection fraction is hyperdynamic (>65%). There is no evidence for ischemia  Patient Profile     75 y.o. male with history of hyperlipidemia, CKD 3 presenting with chest pain, found to have minimally elevated troponins of  86.  Assessment & Plan    Chest pain, minimally elevated troponins -Chest pain currently resolved -Lexiscan Myoview with no evidence for ischemia, echo with EF 45 to 50% -Continue heparin drip to complete 48-hour course -Ambulate today, if no setbacks, plan for discharge in the morning. -Continue Toprol-XL, aspirin 81 mg daily, Lipitor 40 mg.  2.  Mildly reduced ejection fraction -Not using ACE/ARB due to CKD -Consider repeating limited echo as outpatient for proper evaluation of EF -EF hyperdynamic on Lexi Myoview today. -Continue Toprol-XL  3.  Hyperlipidemia -Lipitor  Total encounter time 35 minutes  Greater than 50% was spent in counseling and coordination of care with the patient     Signed, Kate Sable, MD  11/17/2020, 3:43 PM

## 2020-11-18 DIAGNOSIS — I10 Essential (primary) hypertension: Secondary | ICD-10-CM | POA: Diagnosis not present

## 2020-11-18 DIAGNOSIS — I429 Cardiomyopathy, unspecified: Secondary | ICD-10-CM | POA: Diagnosis not present

## 2020-11-18 DIAGNOSIS — K219 Gastro-esophageal reflux disease without esophagitis: Secondary | ICD-10-CM

## 2020-11-18 DIAGNOSIS — I420 Dilated cardiomyopathy: Secondary | ICD-10-CM

## 2020-11-18 DIAGNOSIS — E7800 Pure hypercholesterolemia, unspecified: Secondary | ICD-10-CM

## 2020-11-18 DIAGNOSIS — N1831 Chronic kidney disease, stage 3a: Secondary | ICD-10-CM | POA: Diagnosis not present

## 2020-11-18 DIAGNOSIS — R072 Precordial pain: Secondary | ICD-10-CM | POA: Diagnosis not present

## 2020-11-18 LAB — BASIC METABOLIC PANEL
Anion gap: 6 (ref 5–15)
BUN: 25 mg/dL — ABNORMAL HIGH (ref 8–23)
CO2: 27 mmol/L (ref 22–32)
Calcium: 8.7 mg/dL — ABNORMAL LOW (ref 8.9–10.3)
Chloride: 101 mmol/L (ref 98–111)
Creatinine, Ser: 1.25 mg/dL — ABNORMAL HIGH (ref 0.61–1.24)
GFR, Estimated: 60 mL/min — ABNORMAL LOW (ref >60)
Glucose, Bld: 96 mg/dL (ref 70–99)
Potassium: 4.3 mmol/L (ref 3.5–5.1)
Sodium: 134 mmol/L — ABNORMAL LOW (ref 135–145)

## 2020-11-18 LAB — CBC
HCT: 42.1 % (ref 39.0–52.0)
Hemoglobin: 13.8 g/dL (ref 13.0–17.0)
MCH: 31.1 pg (ref 26.0–34.0)
MCHC: 32.8 g/dL (ref 30.0–36.0)
MCV: 94.8 fL (ref 80.0–100.0)
Platelets: 157 10*3/uL (ref 150–400)
RBC: 4.44 MIL/uL (ref 4.22–5.81)
RDW: 13.2 % (ref 11.5–15.5)
WBC: 6.7 10*3/uL (ref 4.0–10.5)
nRBC: 0 % (ref 0.0–0.2)

## 2020-11-18 LAB — HEPARIN LEVEL (UNFRACTIONATED): Heparin Unfractionated: 0.45 IU/mL (ref 0.30–0.70)

## 2020-11-18 MED ORDER — ASPIRIN 81 MG PO TBEC: 81.0000 mg | DELAYED_RELEASE_TABLET | Freq: Every day | ORAL | 0 refills | Status: AC

## 2020-11-18 MED ORDER — METOPROLOL SUCCINATE ER 25 MG PO TB24: 12.5000 mg | ORAL_TABLET | Freq: Every day | ORAL | 0 refills | Status: DC

## 2020-11-18 MED ORDER — ATORVASTATIN CALCIUM 40 MG PO TABS: 40.0000 mg | ORAL_TABLET | Freq: Every day | ORAL | 0 refills | Status: DC

## 2020-11-18 NOTE — Progress Notes (Signed)
[facility] for Heparin  Indication: chest pain/ACS  No Known Allergies  Patient Measurements: Height: 5\' 11"  (180.3 cm) Weight: 95.8 kg (211 lb 1.6 oz) IBW/kg (Calculated) : 75.3 Heparin Dosing Weight: 94.5 kg   Vital Signs: Temp: 98.3 F (36.8 C) (07/07 0509) Temp Source: Oral (07/07 0509) BP: 108/61 (07/07 0509) Pulse Rate: 59 (07/07 0509)  Labs: Recent Labs    11/15/20 1800 11/15/20 2035 11/15/20 2309 11/16/20 0622 11/16/20 1601 11/17/20 0051 11/17/20 1707 11/18/20 0454  HGB  --   --   --  14.8  --  14.0  --  13.8  HCT  --   --   --  45.2  --  43.0  --  42.1  PLT  --   --   --  172  --  179  --  157  APTT 33  --   --   --   --   --   --   --   LABPROT 13.3  --   --   --   --   --   --   --   INR 1.0  --   --   --   --   --   --   --   HEPARINUNFRC  --   --   --  0.78*   < > 0.53 0.43 0.45  CREATININE  --   --   --  1.34*  --  1.32*  --  1.25*  TROPONINIHS  --  50* 86* 61*  --   --   --   --    < > = values in this interval not displayed.     Estimated Creatinine Clearance: 59.4 mL/min (A) (by C-G formula based on SCr of 1.25 mg/dL (H)).   Medical History: Past Medical History:  Diagnosis Date   Anxiety    BPH (benign prostatic hyperplasia)    Cataract    bilateral- Dec 2021 and jan 2022   GERD (gastroesophageal reflux disease)    Hypercholesterolemia    Hyperlipidemia    Hypertension     Medications:  Medications Prior to Admission  Medication Sig Dispense Refill Last Dose   Bacillus Coagulans-Inulin (PROBIOTIC) 1-250 BILLION-MG CAPS Take 1 capsule by mouth daily.   11/15/2020 at 1300   Cyanocobalamin (VITAMIN B 12 PO) Take 1,000 mcg by mouth daily.   11/15/2020 at 1300   neomycin-bacitracin-polymyxin (NEOSPORIN) ointment Apply 1 application topically every 12 (twelve) hours.   PRN at prn   primidone (MYSOLINE) 50 MG tablet Take 50 mg by mouth at bedtime.   11/15/2020 at 2100   Propylene Glycol (SYSTANE COMPLETE OP)  Place 1 drop into both eyes in the morning, at noon, and at bedtime.   11/15/2020 at 2100   simvastatin (ZOCOR) 20 MG tablet Take 20 mg by mouth daily.   11/15/2020 at 0800   tamsulosin (FLOMAX) 0.4 MG CAPS capsule Take 0.4 mg by mouth daily. At lunchtime   11/15/2020 at 1300    Assessment: Pharmacy consulted to dose heparin in this 75 year old male admitted with ACS/NSTEMI.  No prior anticoag noted. CrCl = 55.3 ml/min   07/05 0622 HL 0.78 @ 1300 units/hr 07/05 1604 HL 0.73 @ 1200 units/hr 07/06 0051 HL 0.53 @ 1050 units/hr; thera x1 07/06 1707 HL 0.43 @ 1050 units/hr; thera x2 07/07 0454 HL 0.45  @ 1050 units/hr, thera X 3   Goal of Therapy:  Heparin level 0.3-0.7 units/ml  Monitor platelets by anticoagulation protocol: Yes   Plan:  7/7:  HL @ 6301 = 0.45 Will continue pt on current rate and recheck HL on 7/08 with AM labs.   Orene Desanctis, PharmD Clinical Pharmacist 11/18/2020 5:56 AM

## 2020-11-18 NOTE — Discharge Summary (Signed)
[facility] [place] NAME: Steve Warren    MR#:  [id number]  DATE OF BIRTH:  July 17, 1945  DATE OF ADMISSION:  11/15/2020 ADMITTING PHYSICIAN: Ezekiel Slocumb, DO  DATE OF DISCHARGE: 11/18/2020 10:23 AM  PRIMARY CARE PHYSICIAN: Sofie Hartigan, MD    ADMISSION DIAGNOSIS:  Elevated troponin [R77.8] Chest pain [R07.9] Chest pain, unspecified type [R07.9] NSTEMI (non-ST elevated myocardial infarction) ([place]) [I21.4]  DISCHARGE DIAGNOSIS:  Chest pain with elevated troponin Cardiomyopathy Chronic kidney disease stage III iron Essential tremor Hyperlipidemia unspecified BPH Pulmonary nodules  SECONDARY DIAGNOSIS:   Past Medical History:  Diagnosis Date  . Anxiety   . BPH (benign prostatic hyperplasia)   . Cataract    bilateral- Dec 2021 and jan 2022  . GERD (gastroesophageal reflux disease)   . Hypercholesterolemia   . Hyperlipidemia   . Hypertension     HOSPITAL COURSE:   1.  Chest pain with minimally elevated troponins.  Lexiscan Myoview showed no evidence of ischemia.  Echocardiogram did have a lower than normal ejection fraction of 45 to 50%.  Cardiology recommended continuing 48 hours total of heparin drip.  Troponin peaked at 86 and went down to 61.  Continue aspirin, Toprol and Lipitor upon discharge home.  LDL 77. 2.  Cardiomyopathy with an EF of 45 to 50%.  With recent dye study, I am holding off on ACE or ARB at this point.  Follow-up with cardiology as outpatient. 3.  Chronic kidney disease stage IIIa.  Creatinine was 1.47 on presentation and down to 1.25 upon discharge home.  Case discussed with Dr. Zollie Scale his nephrologist and he will follow-up routine with Dr. Holley Raring. 4.  Essential tremor on primidone 5.  Hyperlipidemia unspecified on Lipitor 6.  BPH on Flomax 7.  Pulmonary nodules.  Outpatient follow-up.  Radiologist commented that these are stable pulmonary nodules.  DISCHARGE CONDITIONS:   Satisfactory  CONSULTS  OBTAINED:  Treatment Team:  Nelva Bush, MD  DRUG ALLERGIES:  No Known Allergies  DISCHARGE MEDICATIONS:   Allergies as of 11/18/2020   No Known Allergies      Medication List     STOP taking these medications    neomycin-bacitracin-polymyxin ointment Commonly known as: NEOSPORIN   simvastatin 20 MG tablet Commonly known as: ZOCOR       TAKE these medications    aspirin 81 MG EC tablet Take 1 tablet (81 mg total) by mouth daily. Swallow whole.   atorvastatin 40 MG tablet Commonly known as: LIPITOR Take 1 tablet (40 mg total) by mouth daily.   metoprolol succinate 25 MG 24 hr tablet Commonly known as: TOPROL-XL Take 0.5 tablets (12.5 mg total) by mouth daily.   primidone 50 MG tablet Commonly known as: MYSOLINE Take 50 mg by mouth at bedtime.   Probiotic 1-250 BILLION-MG Caps Take 1 capsule by mouth daily.   SYSTANE COMPLETE OP Place 1 drop into both eyes in the morning, at noon, and at bedtime.   tamsulosin 0.4 MG Caps capsule Commonly known as: FLOMAX Take 0.4 mg by mouth daily. At lunchtime   VITAMIN B 12 PO Take 1,000 mcg by mouth daily.         DISCHARGE INSTRUCTIONS:   Follow-up PMD 5 days Follow-up cardiology 1 to 2 weeks Follow-up nephrology routine  If you experience worsening of your admission symptoms, develop shortness of breath, life threatening emergency, suicidal or homicidal thoughts you must seek medical attention immediately by calling 911 or calling your  MD immediately  if symptoms less severe.  You Must read complete instructions/literature along with all the possible adverse reactions/side effects for all the Medicines you take and that have been prescribed to you. Take any new Medicines after you have completely understood and accept all the possible adverse reactions/side effects.   Please note  You were cared for by a hospitalist during your hospital stay. If you have any questions about your discharge medications or  the care you received while you were in the hospital after you are discharged, you can call the unit and asked to speak with the hospitalist on call if the hospitalist that took care of you is not available. Once you are discharged, your primary care physician will handle any further medical issues. Please note that NO REFILLS for any discharge medications will be authorized once you are discharged, as it is imperative that you return to your primary care physician (or establish a relationship with a primary care physician if you do not have one) for your aftercare needs so that they can reassess your need for medications and monitor your lab values.    Today   CHIEF COMPLAINT:   Chief Complaint  Patient presents with  . Chest Pain    HISTORY OF PRESENT ILLNESS:  Brenson Lamp  is a 75 y.o. male came in with chest pain and had minimally elevated troponin   VITAL SIGNS:  Blood pressure 130/78, pulse 60, temperature 98 F (36.7 C), temperature source Oral, resp. rate 17, height 5\' 11"  (1.803 m), weight 95.8 kg, SpO2 100 %.  I/O:   Intake/Output Summary (Last 24 hours) at 11/18/2020 1633 Last data filed at 11/18/2020 1000 Gross per 24 hour  Intake 272.99 ml  Output 650 ml  Net -377.01 ml    PHYSICAL EXAMINATION:  GENERAL:  75 y.o.-year-old patient lying in the bed with no acute distress.  EYES: Pupils equal, round, reactive to light and accommodation. No scleral icterus. HEENT: Head atraumatic, normocephalic. Oropharynx and nasopharynx clear.  LUNGS: Normal breath sounds bilaterally, no wheezing, rales,rhonchi or crepitation. No use of accessory muscles of respiration.  CARDIOVASCULAR: S1, S2 normal. No murmurs, rubs, or gallops.  ABDOMEN: Soft, non-tender, non-distended.  EXTREMITIES: No pedal edema.  NEUROLOGIC: Cranial nerves II through XII are intact. Muscle strength 5/5 in all extremities. Sensation intact. Gait not checked.  PSYCHIATRIC: The patient is alert and oriented x 3.   SKIN: No obvious rash, lesion, or ulcer.   DATA REVIEW:   CBC Recent Labs  Lab 11/18/20 0454  WBC 6.7  HGB 13.8  HCT 42.1  PLT 157    Chemistries  Recent Labs  Lab 11/17/20 0051 11/18/20 0454  NA 132* 134*  K 4.6 4.3  CL 100 101  CO2 29 27  GLUCOSE 101* 96  BUN 21 25*  CREATININE 1.32* 1.25*  CALCIUM 8.6* 8.7*  MG 1.9  --     Microbiology Results  Results for orders placed or performed during the hospital encounter of 11/15/20  Resp Panel by RT-PCR (Flu A&B, Covid) Nasopharyngeal Swab     Status: None   Collection Time: 11/15/20 11:09 PM   Specimen: Nasopharyngeal Swab; Nasopharyngeal(NP) swabs in vial transport medium  Result Value Ref Range Status   SARS Coronavirus 2 by RT PCR NEGATIVE NEGATIVE Final    Comment: (NOTE) SARS-CoV-2 target nucleic acids are NOT DETECTED.  The SARS-CoV-2 RNA is generally detectable in upper respiratory specimens during the acute phase of infection. The lowest concentration of SARS-CoV-2  viral copies this assay can detect is 138 copies/mL. A negative result does not preclude SARS-Cov-2 infection and should not be used as the sole basis for treatment or other patient management decisions. A negative result may occur with  improper specimen collection/handling, submission of specimen other than nasopharyngeal swab, presence of viral mutation(s) within the areas targeted by this assay, and inadequate number of viral copies(<138 copies/mL). A negative result must be combined with clinical observations, patient history, and epidemiological information. The expected result is Negative.  Fact Sheet for Patients:  EntrepreneurPulse.com.au  Fact Sheet for Healthcare Providers:  IncredibleEmployment.be  This test is no t yet approved or cleared by the [place] FDA and  has been authorized for detection and/or diagnosis of SARS-CoV-2 by FDA under an Emergency Use Authorization (EUA). This EUA  will remain  in effect (meaning this test can be used) for the duration of the COVID-19 declaration under Section 564(b)(1) of the Act, 21 U.S.C.section 360bbb-3(b)(1), unless the authorization is terminated  or revoked sooner.       Influenza A by PCR NEGATIVE NEGATIVE Final   Influenza B by PCR NEGATIVE NEGATIVE Final    Comment: (NOTE) The Xpert Xpress SARS-CoV-2/FLU/RSV plus assay is intended as an aid in the diagnosis of influenza from Nasopharyngeal swab specimens and should not be used as a sole basis for treatment. Nasal washings and aspirates are unacceptable for Xpert Xpress SARS-CoV-2/FLU/RSV testing.  Fact Sheet for Patients: EntrepreneurPulse.com.au  Fact Sheet for Healthcare Providers: IncredibleEmployment.be  This test is not yet approved or cleared by the [place] FDA and has been authorized for detection and/or diagnosis of SARS-CoV-2 by FDA under an Emergency Use Authorization (EUA). This EUA will remain in effect (meaning this test can be used) for the duration of the COVID-19 declaration under Section 564(b)(1) of the Act, 21 U.S.C. section 360bbb-3(b)(1), unless the authorization is terminated or revoked.  Performed at [facility], [address] [place], [place] [address]     RADIOLOGY:  [place] Myocar Multi W/Spect Tamela Oddi Motion / EF  Result Date: 11/17/2020  The study is normal.  This is a low risk study.  The left ventricular ejection fraction is hyperdynamic (>65%).  There is no evidence for ischemia      Management plans discussed with the patient, family and they are in agreement.  CODE STATUS:  Code Status History     Date Active Date Inactive Code Status Order ID Comments User Context   11/15/2020 2227 11/18/2020 1529 Full Code [id number]  [facility], DO ED      Questions for Most Recent Historical Code Status (Order [id number])        TOTAL TIME TAKING CARE OF THIS PATIENT: 34 minutes.     Loletha Grayer M.D on 11/18/2020 at 4:33 PM   [facility]  CC: Primary care physician; Sofie Hartigan, MD

## 2020-11-18 NOTE — Plan of Care (Signed)
Pt continues to deny any CP or SHOB with exertion.  Heparin infusion maintained.  He is anticipating discharge later today.  Ayesha Mohair BSN RN CMSRN   Problem: Pain Managment: Goal: General experience of comfort will improve Outcome: Progressing

## 2020-11-18 NOTE — Progress Notes (Signed)
Progress Note  Patient Name: Steve Warren Date of Encounter: 11/18/2020  Primary Cardiologist: End  Subjective   No chest pain, dyspnea, palpitations, dizziness, presyncope, or syncope. Has ambulated in the hallway without symptoms. Lexiscan MPI 7/6 showed no evidence of ischemia.   Inpatient Medications    Scheduled Meds:  acidophilus  1 capsule Oral Daily   aspirin EC  81 mg Oral Daily   atorvastatin  40 mg Oral Daily   metoprolol succinate  12.5 mg Oral Daily   pantoprazole  40 mg Oral Daily   primidone  50 mg Oral q AM   tamsulosin  0.4 mg Oral Daily   vitamin B-12  1,000 mcg Oral Daily   Continuous Infusions:  PRN Meds: acetaminophen, morphine injection, ondansetron (ZOFRAN) IV   Vital Signs    Vitals:   11/17/20 1746 11/17/20 2025 11/18/20 0509 11/18/20 0742  BP: 120/68 (!) 142/79 108/61 130/78  Pulse: 67 69 (!) 59 60  Resp:  20 16 17   Temp: (!) 97.4 F (36.3 C) 98.4 F (36.9 C) 98.3 F (36.8 C) 98 F (36.7 C)  TempSrc: Oral Oral Oral Oral  SpO2: 98% 100% 93% 100%  Weight:      Height:        Intake/Output Summary (Last 24 hours) at 11/18/2020 0910 Last data filed at 11/18/2020 0829 Gross per 24 hour  Intake 372.75 ml  Output 700 ml  Net -327.25 ml   Filed Weights   11/15/20 1750 11/16/20 1419  Weight: 95.3 kg 95.8 kg    Telemetry    SR - Personally Reviewed  ECG    No new tracings - Personally Reviewed  Physical Exam   GEN: No acute distress.   Neck: No JVD. Cardiac: RRR, no murmurs, rubs, or gallops.  Respiratory: Clear to auscultation bilaterally.  GI: Soft, nontender, non-distended.   MS: No edema; No deformity. Neuro:  Alert and oriented x 3; Nonfocal.  Psych: Normal affect.  Labs    Chemistry Recent Labs  Lab 11/16/20 0622 11/17/20 0051 11/18/20 0454  NA 135 132* 134*  K 4.7 4.6 4.3  CL 100 100 101  CO2 29 29 27   GLUCOSE 99 101* 96  BUN 20 21 25*  CREATININE 1.34* 1.32* 1.25*  CALCIUM 9.3 8.6* 8.7*  GFRNONAA  55* 56* 60*  ANIONGAP 6 3* 6     Hematology Recent Labs  Lab 11/16/20 0622 11/17/20 0051 11/18/20 0454  WBC 7.7 7.9 6.7  RBC 4.78 4.51 4.44  HGB 14.8 14.0 13.8  HCT 45.2 43.0 42.1  MCV 94.6 95.3 94.8  MCH 31.0 31.0 31.1  MCHC 32.7 32.6 32.8  RDW 13.1 13.1 13.2  PLT 172 179 157    Cardiac EnzymesNo results for input(s): TROPONINI in the last 168 hours. No results for input(s): TROPIPOC in the last 168 hours.   BNP Recent Labs  Lab 11/15/20 1800  BNP 112.5*     DDimer No results for input(s): DDIMER in the last 168 hours.   Radiology    CTA chest/abdomen/pelvis 11/15/2020:  IMPRESSION: 1. No acute vascular abnormality. Aortic Atherosclerosis (ICD10-I70.0) including at least 2 vessel coronary artery calcifications. 2. Stable pulmonary nodules.  No new pulmonary nodules. 3. Colonic diverticulosis with no acute diverticulitis. 4. 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 __________  CXR 11/15/2020: IMPRESSION: Low lung volumes without acute abnormality.  Cardiac Studies   Lexiscan MPI 11/17/2020: The study is normal. This  is a low risk study. The left ventricular ejection fraction is hyperdynamic (>65%). There is no evidence for ischemia ___________  2D echo 11/16/2020: 1. Left ventricular ejection fraction, by estimation, is 45 to 50%. The  left ventricle has mildly decreased function. The left ventricle  demonstrates regional wall motion abnormalities (see scoring  diagram/findings for description). There is mild left  ventricular hypertrophy. Left ventricular diastolic parameters are  consistent with Grade I diastolic dysfunction (impaired relaxation). There  is dyskinesis of the left ventricular, basal-mid inferolateral wall and  inferior wall.   2. Right ventricular systolic function is normal. The right ventricular  size is normal.   3. The mitral valve was not well visualized. Trivial  mitral valve  regurgitation.   4. The aortic valve was not well visualized. Aortic valve regurgitation  is not visualized. No aortic stenosis is present.  Patient Profile     75 y.o. male with history of CKD stage III and HLD admitted with chest pain and minimally elevated HS-Tn.   Assessment & Plan    1. Chest pain with minimally elevated high sensitivity troponin: -Chest pain free -HS-Tn peaked at 61 -Lexiscan MPI this admission without ischemia with CT attenuated corrected images showing no evidence of significant coronary artery calcifications and minimal distal aortic atherosclerosis  -Has ambulated without symptoms -Continue current medical management including beta blocker, statin, and ASA -No plans for further inpatient cardiac testing  2. Systolic dysfunction: -Appears euvolemic and well compensated -Toprol XL -Not on ACEi/ARB/ARNI/MRA/SGLT2i due to underlying CKD -Outpatient follow up with recommendation to escalate GDMT moving forward with repeat limited echo in several months time   3. HLD: -LDL 77, previously on simvastatin  -Now on Lipitor  -Outpatient follow up  4. Chronic dizziness: -Stable -Appears to be related to orthostasis  -Compression stockings   5. CKD stage III: -Stable  For questions or updates, please contact [place] Please consult [email or web address] for contact info under Cardiology/STEMI.    Signed, Christell Faith, PA-C [place] Pager: [phone] 11/18/2020, 9:10 AM

## 2020-11-29 ENCOUNTER — Encounter: Payer: Self-pay | Admitting: Oncology

## 2020-12-09 ENCOUNTER — Inpatient Hospital Stay: Payer: Medicare PPO | Attending: Oncology

## 2020-12-09 ENCOUNTER — Other Ambulatory Visit: Payer: Self-pay

## 2020-12-09 DIAGNOSIS — D472 Monoclonal gammopathy: Secondary | ICD-10-CM | POA: Diagnosis present

## 2020-12-09 LAB — CBC WITH DIFFERENTIAL/PLATELET
Abs Immature Granulocytes: 0.03 10*3/uL (ref 0.00–0.07)
Basophils Absolute: 0 10*3/uL (ref 0.0–0.1)
Basophils Relative: 1 %
Eosinophils Absolute: 0.2 10*3/uL (ref 0.0–0.5)
Eosinophils Relative: 2 %
HCT: 45.6 % (ref 39.0–52.0)
Hemoglobin: 15.3 g/dL (ref 13.0–17.0)
Immature Granulocytes: 1 %
Lymphocytes Relative: 17 %
Lymphs Abs: 1.1 10*3/uL (ref 0.7–4.0)
MCH: 31.1 pg (ref 26.0–34.0)
MCHC: 33.6 g/dL (ref 30.0–36.0)
MCV: 92.7 fL (ref 80.0–100.0)
Monocytes Absolute: 0.8 10*3/uL (ref 0.1–1.0)
Monocytes Relative: 13 %
Neutro Abs: 4.2 10*3/uL (ref 1.7–7.7)
Neutrophils Relative %: 66 %
Platelets: 172 10*3/uL (ref 150–400)
RBC: 4.92 MIL/uL (ref 4.22–5.81)
RDW: 13.2 % (ref 11.5–15.5)
WBC: 6.3 10*3/uL (ref 4.0–10.5)
nRBC: 0 % (ref 0.0–0.2)

## 2020-12-09 LAB — COMPREHENSIVE METABOLIC PANEL
ALT: 22 U/L (ref 0–44)
AST: 19 U/L (ref 15–41)
Albumin: 4.2 g/dL (ref 3.5–5.0)
Alkaline Phosphatase: 66 U/L (ref 38–126)
Anion gap: 3 — ABNORMAL LOW (ref 5–15)
BUN: 22 mg/dL (ref 8–23)
CO2: 30 mmol/L (ref 22–32)
Calcium: 9.2 mg/dL (ref 8.9–10.3)
Chloride: 98 mmol/L (ref 98–111)
Creatinine, Ser: 1.45 mg/dL — ABNORMAL HIGH (ref 0.61–1.24)
GFR, Estimated: 50 mL/min — ABNORMAL LOW (ref >60)
Glucose, Bld: 98 mg/dL (ref 70–99)
Potassium: 5 mmol/L (ref 3.5–5.1)
Sodium: 131 mmol/L — ABNORMAL LOW (ref 135–145)
Total Bilirubin: 1.1 mg/dL (ref 0.3–1.2)
Total Protein: 8.2 g/dL — ABNORMAL HIGH (ref 6.5–8.1)

## 2020-12-09 NOTE — H&P (View-Only) (Signed)
Cardiology Office Note    Date:  12/10/2020   ID:  Amaje, Igou 10-01-1945, MRN [id number]  PCP:  Sofie Hartigan, MD  Cardiologist:  Nelva Bush, MD  Electrophysiologist:  None   Chief Complaint: Hospital follow up  History of Present Illness:   Allec Claydon Donia is a 75 y.o. male with history of coronary artery calcifications, aortic atherosclerosis, systolic dysfunction, CKD stage IIIa, MGUS, essential tremor, chronic dizziness, HLD, BPH, and GERD who presents for hospital follow-up after recent admission to [facility] from 7/4 through 7/7 for chest pain.  He was admitted to [facility] on 7/4 with a 4-day history of intermittent chest discomfort described as a sharp sensation in the center of his chest that radiated to his back and seemed to be associated primarily with activity, though also wondered if eating precipitated it.  There was some shortness of breath when the pain occurred.  High-sensitivity troponin peaked at 86.  BNP 112.  EKG demonstrated sinus rhythm with no acute ischemic ST-T changes. Chest x-ray showed low lung volumes without acute abnormality.  CTA chest/abdomen/pelvis showed no acute vascular abnormality with aortic atherosclerosis as well as at least two-vessel coronary artery calcifications, stable pulmonary nodules, colonic diverticulosis without acute diverticulitis, and severe degenerative changes within the lower lumbar spine noted.  Echo showed an EF of 45 to 50%, mild LVH, dyskinesis of the left ventricular, basal mid inferolateral wall, and inferior wall, grade 1 diastolic dysfunction, normal RV systolic function and ventricular cavity size, and trivial mitral regurgitation.  Lexiscan MPI showed no evidence of significant ischemia and was overall low risk.  He comes in accompanied by his wife today and is doing reasonably well from a cardiac perspective.  He does continue to note brief randomly occurring intermittent episodes of chest discomfort that are not as  severe as what he experienced leading up to his hospitalization.  He was transitioned from Toprol-XL 12.5 mg to carvedilol half of a 3.125 mg twice daily by his PCPs office.  With this change, he has noted significant hypotension with BP at times as low as Q000111Q systolic with frequent readings in the 123XX123 to 0000000 systolic.  With this, he has noted worsening chronic dizziness, particularly with positional changes, as well as increased fatigue.  No falls.  He is tolerating ASA and atorvastatin.   Labs independently reviewed: 11/2020 - HGB 15.3, PLT 172, potassium 5.0, BUN 22, serum creatinine 1.45, bili 4.2, AST/ALT normal, magnesium 1.9, TC 128, TG 55, HDL 40, LDL 77  Past Medical History:  Diagnosis Date   Anxiety    BPH (benign prostatic hyperplasia)    Cataract    bilateral- Dec 2021 and jan 2022   GERD (gastroesophageal reflux disease)    Hypercholesterolemia    Hyperlipidemia    Hypertension     Past Surgical History:  Procedure Laterality Date   APPENDECTOMY     CATARACT EXTRACTION, BILATERAL     COLONOSCOPY     TONSILLECTOMY      Current Medications: Current Meds  Medication Sig   aspirin EC 81 MG EC tablet Take 1 tablet (81 mg total) by mouth daily. Swallow whole.   Bacillus Coagulans-Inulin (PROBIOTIC) 1-250 BILLION-MG CAPS Take 1 capsule by mouth daily.   Cyanocobalamin (VITAMIN B 12 PO) Take 1,000 mcg by mouth daily.   metoprolol succinate (TOPROL XL) 25 MG 24 hr tablet Take 0.5 tablets (12.5 mg total) by mouth daily.   primidone (MYSOLINE) 50 MG tablet Take 50 mg by  mouth at bedtime.   Propylene Glycol (SYSTANE COMPLETE OP) Place 1 drop into both eyes in the morning, at noon, and at bedtime.   tamsulosin (FLOMAX) 0.4 MG CAPS capsule Take 0.4 mg by mouth daily. At lunchtime   [DISCONTINUED] atorvastatin (LIPITOR) 40 MG tablet Take 1 tablet (40 mg total) by mouth daily.   [DISCONTINUED] carvedilol (COREG) 3.125 MG tablet     Allergies:   Patient has no known allergies.    Social History   Socioeconomic History   Marital status: Married    Spouse name: Not on file   Number of children: Not on file   Years of education: Not on file   Highest education level: Not on file  Occupational History   Not on file  Tobacco Use   Smoking status: Former    Types: Cigarettes    Quit date: 2012    Years since quitting: 10.5   Smokeless tobacco: Never  Vaping Use   Vaping Use: Never used  Substance and Sexual Activity   Alcohol use: Yes    Comment: once a month liquor   Drug use: Never   Sexual activity: Not Currently  Other Topics Concern   Not on file  Social History Narrative   Not on file   Social Determinants of Health   Financial Resource Strain: Not on file  Food Insecurity: Not on file  Transportation Needs: Not on file  Physical Activity: Not on file  Stress: Not on file  Social Connections: Not on file     Family History:  The patient's family history includes Alzheimer's disease in his father; Diabetes in his maternal grandmother.  ROS:   Review of Systems  Constitutional:  Positive for malaise/fatigue. Negative for chills, diaphoresis, fever and weight loss.  HENT:  Negative for congestion.   Eyes:  Negative for discharge and redness.  Respiratory:  Negative for cough, sputum production, shortness of breath and wheezing.   Cardiovascular:  Positive for chest pain. Negative for palpitations, orthopnea, claudication, leg swelling and PND.  Gastrointestinal:  Negative for abdominal pain, heartburn, nausea and vomiting.  Musculoskeletal:  Negative for falls and myalgias.  Skin:  Negative for rash.  Neurological:  Positive for dizziness and weakness. Negative for tingling, tremors, sensory change, speech change, focal weakness and loss of consciousness.  Endo/Heme/Allergies:  Does not bruise/bleed easily.  Psychiatric/Behavioral:  Negative for substance abuse. The patient is not nervous/anxious.   All other systems reviewed and are  negative.   EKGs/Labs/Other Studies Reviewed:    Studies reviewed were summarized above. The additional studies were reviewed today:  2D echo 11/16/2020: 1. Left ventricular ejection fraction, by estimation, is 45 to 50%. The  left ventricle has mildly decreased function. The left ventricle  demonstrates regional wall motion abnormalities (see scoring  diagram/findings for description). There is mild left  ventricular hypertrophy. Left ventricular diastolic parameters are  consistent with Grade I diastolic dysfunction (impaired relaxation). There  is dyskinesis of the left ventricular, basal-mid inferolateral wall and  inferior wall.   2. Right ventricular systolic function is normal. The right ventricular  size is normal.   3. The mitral valve was not well visualized. Trivial mitral valve  regurgitation.   4. The aortic valve was not well visualized. Aortic valve regurgitation  is not visualized. No aortic stenosis is present. __________  Carlton Adam MPI 11/17/2020: The study is normal. This is a low risk study. The left ventricular ejection fraction is hyperdynamic (>65%). There is no evidence for ischemia  EKG:  EKG is ordered today.  The EKG ordered today demonstrates NSR, 60 bpm, no acute ST-T changes  Recent Labs: 11/15/2020: B Natriuretic Peptide 112.5 11/17/2020: Magnesium 1.9 12/09/2020: ALT 22; BUN 22; Creatinine, Ser 1.45; Hemoglobin 15.3; Platelets 172; Potassium 5.0; Sodium 131  Recent Lipid Panel    Component Value Date/Time   CHOL 128 11/16/2020 1601   TRIG 55 11/16/2020 1601   HDL 40 (L) 11/16/2020 1601   CHOLHDL 3.2 11/16/2020 1601   VLDL 11 11/16/2020 1601   LDLCALC 77 11/16/2020 1601    PHYSICAL EXAM:    VS:  BP 110/76 (BP Location: Left Arm, Patient Position: Sitting, Cuff Size: Normal)   Pulse 60   Ht '5\' 11"'$  (1.803 m)   Wt 210 lb (95.3 kg)   SpO2 97%   BMI 29.29 kg/m   BMI: Body mass index is 29.29 kg/m.  Physical Exam Vitals reviewed.   Constitutional:      Appearance: He is well-developed.  HENT:     Head: Normocephalic and atraumatic.  Eyes:     General:        Right eye: No discharge.        Left eye: No discharge.  Neck:     Vascular: No JVD.  Cardiovascular:     Rate and Rhythm: Normal rate and regular rhythm.     Pulses:          Posterior tibial pulses are 2+ on the right side and 2+ on the left side.     Heart sounds: Normal heart sounds, S1 normal and S2 normal. Heart sounds not distant. No midsystolic click and no opening snap. No murmur heard.   No friction rub.  Pulmonary:     Effort: Pulmonary effort is normal. No respiratory distress.     Breath sounds: Normal breath sounds. No decreased breath sounds, wheezing or rales.  Chest:     Chest wall: No tenderness.  Abdominal:     General: There is no distension.     Palpations: Abdomen is soft.     Tenderness: There is no abdominal tenderness.  Musculoskeletal:     Cervical back: Normal range of motion.     Right lower leg: No edema.     Left lower leg: No edema.  Skin:    General: Skin is warm and dry.     Nails: There is no clubbing.  Neurological:     Mental Status: He is alert and oriented to person, place, and time.  Psychiatric:        Speech: Speech normal.        Behavior: Behavior normal.        Thought Content: Thought content normal.        Judgment: Judgment normal.    Wt Readings from Last 3 Encounters:  12/10/20 210 lb (95.3 kg)  11/16/20 211 lb 1.6 oz (95.8 kg)  11/13/20 210 lb (95.3 kg)     ASSESSMENT & PLAN:   CAD involving the native coronary arteries with other forms of chest pain and history of elevated troponin: At least two-vessel CAD noted on CTA chest during recent admission.  Symptoms are improving, though may potentially be exacerbated by episodes of hypotension.  Lexiscan MPI showed no evidence of significant ischemia.  Discontinue carvedilol with rechallenge of Toprol-XL 12.5 mg daily.  He will otherwise  continue risk factor modification and medical therapy including ASA and atorvastatin.  Systolic dysfunction: He appears euvolemic and well compensated.  Transition from carvedilol  to metoprolol as outlined below.  With CKD and relative hypotension he has not been initiated on ACE inhibitor/ARB/ARNI/MRA/SGLT2i.  Not requiring a standing diuretic.  CKD stage IIIa: Followed by nephrology.  Minimize hypotension to avoid risk of ATN.  HLD: LDL 77 with normal AST/ALT earlier this month.  Previously on simvastatin with transition to atorvastatin during recent admission in an effort to achieve a target LDL less than 70.  Follow-up fasting lipid panel and LFT in approximately 2 to 3 months.  Hypotension: BP has been quite soft, at times in the Q000111Q systolic, on half a dose of carvedilol 3.125 mg twice daily.  Prior to the initiation of beta-blockade in the setting of systolic dysfunction, he did not have a diagnosis of hypertension.  Therefore, we may ultimately see that he is unable to tolerate beta-blockade.  Nonetheless, upon reviewing home BP log it does appear he was tolerating Toprol-XL 12.5 mg daily.  Therefore, we will rechallenge him with Toprol XL 12.5 mg daily beginning on 7/30.  He will send [place] a MyChart message in 7 to 10 days to let us know how his BP has been running.  If his BP remains soft on beta-blockade this will likely need to be discontinued.  Disposition: F/u with Dr. Saunders Revel or an APP in 2 months.   Medication Adjustments/Labs and Tests Ordered: Current medicines are reviewed at length with the patient today.  Concerns regarding medicines are outlined above. Medication changes, Labs and Tests ordered today are summarized above and listed in the Patient Instructions accessible in Encounters.   Signed, Christell Faith, PA-C 12/10/2020 1:05 PM     [address] [place], [place] [address] [phone]

## 2020-12-09 NOTE — Progress Notes (Signed)
Cardiology Office Note    Date:  12/10/2020   ID:  Steve, Warren 1945-12-18, MRN [id number]  PCP:  Sofie Hartigan, MD  Cardiologist:  Nelva Bush, MD  Electrophysiologist:  None   Chief Complaint: Hospital follow up  History of Present Illness:   Steve Warren is a 75 y.o. male with history of coronary artery calcifications, aortic atherosclerosis, systolic dysfunction, CKD stage IIIa, MGUS, essential tremor, chronic dizziness, HLD, BPH, and GERD who presents for hospital follow-up after recent admission to [facility] from 7/4 through 7/7 for chest pain.  He was admitted to [facility] on 7/4 with a 4-day history of intermittent chest discomfort described as a sharp sensation in the center of his chest that radiated to his back and seemed to be associated primarily with activity, though also wondered if eating precipitated it.  There was some shortness of breath when the pain occurred.  High-sensitivity troponin peaked at 86.  BNP 112.  EKG demonstrated sinus rhythm with no acute ischemic ST-T changes. Chest x-ray showed low lung volumes without acute abnormality.  CTA chest/abdomen/pelvis showed no acute vascular abnormality with aortic atherosclerosis as well as at least two-vessel coronary artery calcifications, stable pulmonary nodules, colonic diverticulosis without acute diverticulitis, and severe degenerative changes within the lower lumbar spine noted.  Echo showed an EF of 45 to 50%, mild LVH, dyskinesis of the left ventricular, basal mid inferolateral wall, and inferior wall, grade 1 diastolic dysfunction, normal RV systolic function and ventricular cavity size, and trivial mitral regurgitation.  Lexiscan MPI showed no evidence of significant ischemia and was overall low risk.  He comes in accompanied by his wife today and is doing reasonably well from a cardiac perspective.  He does continue to note brief randomly occurring intermittent episodes of chest discomfort that are not as  severe as what he experienced leading up to his hospitalization.  He was transitioned from Toprol-XL 12.5 mg to carvedilol half of a 3.125 mg twice daily by his PCPs office.  With this change, he has noted significant hypotension with BP at times as low as Q000111Q systolic with frequent readings in the 123XX123 to 0000000 systolic.  With this, he has noted worsening chronic dizziness, particularly with positional changes, as well as increased fatigue.  No falls.  He is tolerating ASA and atorvastatin.   Labs independently reviewed: 11/2020 - HGB 15.3, PLT 172, potassium 5.0, BUN 22, serum creatinine 1.45, bili 4.2, AST/ALT normal, magnesium 1.9, TC 128, TG 55, HDL 40, LDL 77  Past Medical History:  Diagnosis Date   Anxiety    BPH (benign prostatic hyperplasia)    Cataract    bilateral- Dec 2021 and jan 2022   GERD (gastroesophageal reflux disease)    Hypercholesterolemia    Hyperlipidemia    Hypertension     Past Surgical History:  Procedure Laterality Date   APPENDECTOMY     CATARACT EXTRACTION, BILATERAL     COLONOSCOPY     TONSILLECTOMY      Current Medications: Current Meds  Medication Sig   aspirin EC 81 MG EC tablet Take 1 tablet (81 mg total) by mouth daily. Swallow whole.   Bacillus Coagulans-Inulin (PROBIOTIC) 1-250 BILLION-MG CAPS Take 1 capsule by mouth daily.   Cyanocobalamin (VITAMIN B 12 PO) Take 1,000 mcg by mouth daily.   metoprolol succinate (TOPROL XL) 25 MG 24 hr tablet Take 0.5 tablets (12.5 mg total) by mouth daily.   primidone (MYSOLINE) 50 MG tablet Take 50 mg by  mouth at bedtime.   Propylene Glycol (SYSTANE COMPLETE OP) Place 1 drop into both eyes in the morning, at noon, and at bedtime.   tamsulosin (FLOMAX) 0.4 MG CAPS capsule Take 0.4 mg by mouth daily. At lunchtime   [DISCONTINUED] atorvastatin (LIPITOR) 40 MG tablet Take 1 tablet (40 mg total) by mouth daily.   [DISCONTINUED] carvedilol (COREG) 3.125 MG tablet     Allergies:   Patient has no known allergies.    Social History   Socioeconomic History   Marital status: Married    Spouse name: Not on file   Number of children: Not on file   Years of education: Not on file   Highest education level: Not on file  Occupational History   Not on file  Tobacco Use   Smoking status: Former    Types: Cigarettes    Quit date: 2012    Years since quitting: 10.5   Smokeless tobacco: Never  Vaping Use   Vaping Use: Never used  Substance and Sexual Activity   Alcohol use: Yes    Comment: once a month liquor   Drug use: Never   Sexual activity: Not Currently  Other Topics Concern   Not on file  Social History Narrative   Not on file   Social Determinants of Health   Financial Resource Strain: Not on file  Food Insecurity: Not on file  Transportation Needs: Not on file  Physical Activity: Not on file  Stress: Not on file  Social Connections: Not on file     Family History:  The patient's family history includes Alzheimer's disease in his father; Diabetes in his maternal grandmother.  ROS:   Review of Systems  Constitutional:  Positive for malaise/fatigue. Negative for chills, diaphoresis, fever and weight loss.  HENT:  Negative for congestion.   Eyes:  Negative for discharge and redness.  Respiratory:  Negative for cough, sputum production, shortness of breath and wheezing.   Cardiovascular:  Positive for chest pain. Negative for palpitations, orthopnea, claudication, leg swelling and PND.  Gastrointestinal:  Negative for abdominal pain, heartburn, nausea and vomiting.  Musculoskeletal:  Negative for falls and myalgias.  Skin:  Negative for rash.  Neurological:  Positive for dizziness and weakness. Negative for tingling, tremors, sensory change, speech change, focal weakness and loss of consciousness.  Endo/Heme/Allergies:  Does not bruise/bleed easily.  Psychiatric/Behavioral:  Negative for substance abuse. The patient is not nervous/anxious.   All other systems reviewed and are  negative.   EKGs/Labs/Other Studies Reviewed:    Studies reviewed were summarized above. The additional studies were reviewed today:  2D echo 11/16/2020: 1. Left ventricular ejection fraction, by estimation, is 45 to 50%. The  left ventricle has mildly decreased function. The left ventricle  demonstrates regional wall motion abnormalities (see scoring  diagram/findings for description). There is mild left  ventricular hypertrophy. Left ventricular diastolic parameters are  consistent with Grade I diastolic dysfunction (impaired relaxation). There  is dyskinesis of the left ventricular, basal-mid inferolateral wall and  inferior wall.   2. Right ventricular systolic function is normal. The right ventricular  size is normal.   3. The mitral valve was not well visualized. Trivial mitral valve  regurgitation.   4. The aortic valve was not well visualized. Aortic valve regurgitation  is not visualized. No aortic stenosis is present. __________  Carlton Adam MPI 11/17/2020: The study is normal. This is a low risk study. The left ventricular ejection fraction is hyperdynamic (>65%). There is no evidence for ischemia  EKG:  EKG is ordered today.  The EKG ordered today demonstrates NSR, 60 bpm, no acute ST-T changes  Recent Labs: 11/15/2020: B Natriuretic Peptide 112.5 11/17/2020: Magnesium 1.9 12/09/2020: ALT 22; BUN 22; Creatinine, Ser 1.45; Hemoglobin 15.3; Platelets 172; Potassium 5.0; Sodium 131  Recent Lipid Panel    Component Value Date/Time   CHOL 128 11/16/2020 1601   TRIG 55 11/16/2020 1601   HDL 40 (L) 11/16/2020 1601   CHOLHDL 3.2 11/16/2020 1601   VLDL 11 11/16/2020 1601   LDLCALC 77 11/16/2020 1601    PHYSICAL EXAM:    VS:  BP 110/76 (BP Location: Left Arm, Patient Position: Sitting, Cuff Size: Normal)   Pulse 60   Ht '5\' 11"'$  (1.803 m)   Wt 210 lb (95.3 kg)   SpO2 97%   BMI 29.29 kg/m   BMI: Body mass index is 29.29 kg/m.  Physical Exam Vitals reviewed.   Constitutional:      Appearance: He is well-developed.  HENT:     Head: Normocephalic and atraumatic.  Eyes:     General:        Right eye: No discharge.        Left eye: No discharge.  Neck:     Vascular: No JVD.  Cardiovascular:     Rate and Rhythm: Normal rate and regular rhythm.     Pulses:          Posterior tibial pulses are 2+ on the right side and 2+ on the left side.     Heart sounds: Normal heart sounds, S1 normal and S2 normal. Heart sounds not distant. No midsystolic click and no opening snap. No murmur heard.   No friction rub.  Pulmonary:     Effort: Pulmonary effort is normal. No respiratory distress.     Breath sounds: Normal breath sounds. No decreased breath sounds, wheezing or rales.  Chest:     Chest wall: No tenderness.  Abdominal:     General: There is no distension.     Palpations: Abdomen is soft.     Tenderness: There is no abdominal tenderness.  Musculoskeletal:     Cervical back: Normal range of motion.     Right lower leg: No edema.     Left lower leg: No edema.  Skin:    General: Skin is warm and dry.     Nails: There is no clubbing.  Neurological:     Mental Status: He is alert and oriented to person, place, and time.  Psychiatric:        Speech: Speech normal.        Behavior: Behavior normal.        Thought Content: Thought content normal.        Judgment: Judgment normal.    Wt Readings from Last 3 Encounters:  12/10/20 210 lb (95.3 kg)  11/16/20 211 lb 1.6 oz (95.8 kg)  11/13/20 210 lb (95.3 kg)     ASSESSMENT & PLAN:   CAD involving the native coronary arteries with other forms of chest pain and history of elevated troponin: At least two-vessel CAD noted on CTA chest during recent admission.  Symptoms are improving, though may potentially be exacerbated by episodes of hypotension.  Lexiscan MPI showed no evidence of significant ischemia.  Discontinue carvedilol with rechallenge of Toprol-XL 12.5 mg daily.  He will otherwise  continue risk factor modification and medical therapy including ASA and atorvastatin.  Systolic dysfunction: He appears euvolemic and well compensated.  Transition from carvedilol  to metoprolol as outlined below.  With CKD and relative hypotension he has not been initiated on ACE inhibitor/ARB/ARNI/MRA/SGLT2i.  Not requiring a standing diuretic.  CKD stage IIIa: Followed by nephrology.  Minimize hypotension to avoid risk of ATN.  HLD: LDL 77 with normal AST/ALT earlier this month.  Previously on simvastatin with transition to atorvastatin during recent admission in an effort to achieve a target LDL less than 70.  Follow-up fasting lipid panel and LFT in approximately 2 to 3 months.  Hypotension: BP has been quite soft, at times in the Q000111Q systolic, on half a dose of carvedilol 3.125 mg twice daily.  Prior to the initiation of beta-blockade in the setting of systolic dysfunction, he did not have a diagnosis of hypertension.  Therefore, we may ultimately see that he is unable to tolerate beta-blockade.  Nonetheless, upon reviewing home BP log it does appear he was tolerating Toprol-XL 12.5 mg daily.  Therefore, we will rechallenge him with Toprol XL 12.5 mg daily beginning on 7/30.  He will send [place] a MyChart message in 7 to 10 days to let us know how his BP has been running.  If his BP remains soft on beta-blockade this will likely need to be discontinued.  Disposition: F/u with Dr. Saunders Revel or an APP in 2 months.   Medication Adjustments/Labs and Tests Ordered: Current medicines are reviewed at length with the patient today.  Concerns regarding medicines are outlined above. Medication changes, Labs and Tests ordered today are summarized above and listed in the Patient Instructions accessible in Encounters.   Signed, Christell Faith, PA-C 12/10/2020 1:05 PM     [address] [place], [place] [address] [phone]

## 2020-12-10 ENCOUNTER — Encounter: Payer: Self-pay | Admitting: Physician Assistant

## 2020-12-10 ENCOUNTER — Ambulatory Visit: Payer: Medicare PPO | Admitting: Physician Assistant

## 2020-12-10 VITALS — BP 110/76 | HR 60 | Ht 71.0 in | Wt 210.0 lb

## 2020-12-10 DIAGNOSIS — N1831 Chronic kidney disease, stage 3a: Secondary | ICD-10-CM | POA: Diagnosis not present

## 2020-12-10 DIAGNOSIS — E785 Hyperlipidemia, unspecified: Secondary | ICD-10-CM

## 2020-12-10 DIAGNOSIS — I248 Other forms of acute ischemic heart disease: Secondary | ICD-10-CM | POA: Diagnosis not present

## 2020-12-10 DIAGNOSIS — I25118 Atherosclerotic heart disease of native coronary artery with other forms of angina pectoris: Secondary | ICD-10-CM

## 2020-12-10 DIAGNOSIS — I519 Heart disease, unspecified: Secondary | ICD-10-CM | POA: Diagnosis not present

## 2020-12-10 DIAGNOSIS — I959 Hypotension, unspecified: Secondary | ICD-10-CM

## 2020-12-10 LAB — KAPPA/LAMBDA LIGHT CHAINS
Kappa free light chain: 159 mg/L — ABNORMAL HIGH (ref 3.3–19.4)
Kappa, lambda light chain ratio: 12.33 — ABNORMAL HIGH (ref 0.26–1.65)
Lambda free light chains: 12.9 mg/L (ref 5.7–26.3)

## 2020-12-10 MED ORDER — METOPROLOL SUCCINATE ER 25 MG PO TB24: 12.5000 mg | ORAL_TABLET | Freq: Every day | ORAL | 3 refills | Status: DC

## 2020-12-10 MED ORDER — ATORVASTATIN CALCIUM 40 MG PO TABS: 40.0000 mg | ORAL_TABLET | Freq: Every day | ORAL | 3 refills | Status: DC

## 2020-12-10 NOTE — Patient Instructions (Signed)
Medication Instructions:  Your physician has recommended you make the following change in your medication:   STOP Carvediolol START Metoprolol tartrate 25 mg take one half tablet (12.5 mg) once daily.  Refill also sent in for your atorvastatin.   *If you need a refill on your cardiac medications before your next appointment, please call your pharmacy*   Lab Work: Lipid & Liver panel in 2 months (Before appointment). No appointment is needed for this. Make sure to not eat or drink anything after midnight before except sip of water with your medications.   Go to the [place] at [facility] then go to 1st desk on the right to check in, past the screening table. Lab hours: Monday- Friday (7:30 am- 5:30 pm)   If you have labs (blood work) drawn today and your tests are completely normal, you will receive your results only by: [place] (if you have MyChart) OR A paper copy in the mail If you have any lab test that is abnormal or we need to change your treatment, we will call you to review the results.   Testing/Procedures: None   Follow-Up: At [facility], you and your health needs are our priority.  As part of our continuing mission to provide you with exceptional heart care, we have created designated Provider Care Teams.  These Care Teams include your primary Cardiologist (physician) and Advanced Practice Providers (APPs -  Physician Assistants and Nurse Practitioners) who all work together to provide you with the care you need, when you need it.   Your next appointment:   2 month(s)  The format for your next appointment:   In Person  Provider:   Nelva Bush, MD or Christell Faith, PA-C

## 2020-12-13 LAB — MULTIPLE MYELOMA PANEL, SERUM
Albumin SerPl Elph-Mcnc: 4.1 g/dL (ref 2.9–4.4)
Albumin/Glob SerPl: 1.2 (ref 0.7–1.7)
Alpha 1: 0.2 g/dL (ref 0.0–0.4)
Alpha2 Glob SerPl Elph-Mcnc: 0.7 g/dL (ref 0.4–1.0)
B-Globulin SerPl Elph-Mcnc: 0.8 g/dL (ref 0.7–1.3)
Gamma Glob SerPl Elph-Mcnc: 1.8 g/dL (ref 0.4–1.8)
Globulin, Total: 3.5 g/dL (ref 2.2–3.9)
IgA: 80 mg/dL (ref 61–437)
IgG (Immunoglobin G), Serum: 2055 mg/dL — ABNORMAL HIGH (ref 603–1613)
IgM (Immunoglobulin M), Srm: 32 mg/dL (ref 15–143)
M Protein SerPl Elph-Mcnc: 1.1 g/dL — ABNORMAL HIGH
Total Protein ELP: 7.6 g/dL (ref 6.0–8.5)

## 2020-12-22 NOTE — Telephone Encounter (Signed)
Spoke with patient and wife.  They note he developed an episode of exertional angina and dyspnea while walking outdoors in [place].  Upon sitting to rest symptoms improved without further intervention in approximately 5 to 10 minutes.  He has not been experiencing rest angina.  No further chest pain since.  Given his ongoing exertional angina, and dyspnea, and in the context with echo demonstrating EF of 45 to 50%, we will proceed with a diagnostic R/LHC.  Risks and benefits of cardiac catheterization have been discussed with the patient including risks of bleeding, bruising, infection, kidney damage, stroke, heart attack, and death. The patient understands these risks and is willing to proceed with the procedure. All questions have been answered and concerns listened to.  Will route message to RN for scheduling.

## 2020-12-22 NOTE — Telephone Encounter (Signed)
Spoke with patient and his wife via speaker phone. Reviewed date, time, and instructions for his procedure. They requested instructions to also be sent via My Chart. They verbalized understanding of all information, all questions were answered, and they confirmed.

## 2020-12-22 NOTE — Telephone Encounter (Signed)
Spoke with patients wife per release form to see if date and time would work for them. She verbalized agreement with that and advised I would call back once I get that scheduled.

## 2020-12-29 ENCOUNTER — Telehealth: Payer: Self-pay | Admitting: Internal Medicine

## 2020-12-29 NOTE — Telephone Encounter (Signed)
Called over to [facility] to review fax we received with orders from Dr. Holley Raring for this mutual patient. Provided our number and requested for return call to discuss some orders. Reaching out to them to let them know these labs were not done and how to proceed.   These are lab orders with note requesting labs to be included on visit 02/10/21. Patient does not currently have appointment for that date. There was appointment on 7/29 but next appt is scheduled in October at this time.

## 2020-12-30 NOTE — Telephone Encounter (Signed)
Spoke with patient and he gave phone to wife. Reviewed that orders we received were for [facility] and that we do not have him down for appointment on 7/29. She then clarified that patient has labs ordered to be done on 12/10/20 over at the [facility] location and she has delivered the lab orders for that to be done with the other Epic orders of Lipid & Liver panel. She was appreciative for the call and clarification with no further questions at this time.

## 2020-12-30 NOTE — Telephone Encounter (Signed)
Spoke with Glenard Haring to let her know that labs requested by Dr. Holley Raring were not done at [facility] and patient does not have upcoming appointment with [place] until October. She requested I call patient and advise he call their office.

## 2021-01-04 ENCOUNTER — Ambulatory Visit
Admission: RE | Admit: 2021-01-04 | Discharge: 2021-01-05 | Disposition: A | Discharge status: Home / Self Care | Payer: Medicare PPO | Attending: Internal Medicine | Admitting: Internal Medicine

## 2021-01-04 ENCOUNTER — Other Ambulatory Visit: Payer: Self-pay

## 2021-01-04 ENCOUNTER — Other Ambulatory Visit: Payer: Self-pay | Admitting: *Deleted

## 2021-01-04 ENCOUNTER — Encounter: Payer: Self-pay | Admitting: Internal Medicine

## 2021-01-04 ENCOUNTER — Encounter
Admission: RE | Disposition: A | Discharge status: Home / Self Care | Payer: Self-pay | Source: Home / Self Care | Attending: Internal Medicine

## 2021-01-04 DIAGNOSIS — N400 Enlarged prostate without lower urinary tract symptoms: Secondary | ICD-10-CM | POA: Diagnosis not present

## 2021-01-04 DIAGNOSIS — I129 Hypertensive chronic kidney disease with stage 1 through stage 4 chronic kidney disease, or unspecified chronic kidney disease: Secondary | ICD-10-CM | POA: Diagnosis not present

## 2021-01-04 DIAGNOSIS — E785 Hyperlipidemia, unspecified: Secondary | ICD-10-CM | POA: Insufficient documentation

## 2021-01-04 DIAGNOSIS — I429 Cardiomyopathy, unspecified: Secondary | ICD-10-CM | POA: Insufficient documentation

## 2021-01-04 DIAGNOSIS — I959 Hypotension, unspecified: Secondary | ICD-10-CM | POA: Diagnosis not present

## 2021-01-04 DIAGNOSIS — N183 Chronic kidney disease, stage 3 unspecified: Secondary | ICD-10-CM | POA: Diagnosis present

## 2021-01-04 DIAGNOSIS — N1831 Chronic kidney disease, stage 3a: Secondary | ICD-10-CM | POA: Diagnosis not present

## 2021-01-04 DIAGNOSIS — I25118 Atherosclerotic heart disease of native coronary artery with other forms of angina pectoris: Secondary | ICD-10-CM | POA: Diagnosis not present

## 2021-01-04 DIAGNOSIS — K219 Gastro-esophageal reflux disease without esophagitis: Secondary | ICD-10-CM | POA: Diagnosis not present

## 2021-01-04 DIAGNOSIS — Z7982 Long term (current) use of aspirin: Secondary | ICD-10-CM | POA: Insufficient documentation

## 2021-01-04 DIAGNOSIS — I2584 Coronary atherosclerosis due to calcified coronary lesion: Secondary | ICD-10-CM | POA: Insufficient documentation

## 2021-01-04 DIAGNOSIS — I2510 Atherosclerotic heart disease of native coronary artery without angina pectoris: Secondary | ICD-10-CM

## 2021-01-04 DIAGNOSIS — I2582 Chronic total occlusion of coronary artery: Secondary | ICD-10-CM | POA: Insufficient documentation

## 2021-01-04 DIAGNOSIS — Z87891 Personal history of nicotine dependence: Secondary | ICD-10-CM | POA: Insufficient documentation

## 2021-01-04 DIAGNOSIS — Z7902 Long term (current) use of antithrombotics/antiplatelets: Secondary | ICD-10-CM | POA: Diagnosis not present

## 2021-01-04 DIAGNOSIS — G250 Essential tremor: Secondary | ICD-10-CM | POA: Diagnosis not present

## 2021-01-04 DIAGNOSIS — I208 Other forms of angina pectoris: Secondary | ICD-10-CM | POA: Diagnosis present

## 2021-01-04 DIAGNOSIS — Z79899 Other long term (current) drug therapy: Secondary | ICD-10-CM | POA: Insufficient documentation

## 2021-01-04 DIAGNOSIS — I519 Heart disease, unspecified: Secondary | ICD-10-CM

## 2021-01-04 DIAGNOSIS — R079 Chest pain, unspecified: Secondary | ICD-10-CM

## 2021-01-04 DIAGNOSIS — Z955 Presence of coronary angioplasty implant and graft: Secondary | ICD-10-CM

## 2021-01-04 HISTORY — PX: CORONARY STENT INTERVENTION: CATH118234

## 2021-01-04 HISTORY — PX: RIGHT/LEFT HEART CATH AND CORONARY ANGIOGRAPHY: CATH118266

## 2021-01-04 LAB — POCT ACTIVATED CLOTTING TIME: Activated Clotting Time: 347 seconds

## 2021-01-04 SURGERY — RIGHT/LEFT HEART CATH AND CORONARY ANGIOGRAPHY
Anesthesia: Moderate Sedation

## 2021-01-04 MED ORDER — PROBIOTIC 1-250 BILLION-MG PO CAPS: 1.0000 | ORAL_CAPSULE | Freq: Every day | ORAL | Status: DC

## 2021-01-04 MED ORDER — ASPIRIN 81 MG PO CHEW: 81.0000 mg | CHEWABLE_TABLET | ORAL | Status: DC

## 2021-01-04 MED ORDER — SODIUM CHLORIDE 0.9 % WEIGHT BASED INFUSION: 1.0000 mL/kg/h | INTRAVENOUS | Status: DC

## 2021-01-04 MED ORDER — FENTANYL CITRATE (PF) 100 MCG/2ML IJ SOLN
INTRAMUSCULAR | Status: AC
  Filled 2021-01-04: qty 2

## 2021-01-04 MED ORDER — IOHEXOL 350 MG/ML SOLN
INTRAVENOUS | Status: DC | PRN
  Administered 2021-01-04: 78 mL

## 2021-01-04 MED ORDER — HEPARIN (PORCINE) IN NACL 1000-0.9 UT/500ML-% IV SOLN
INTRAVENOUS | Status: AC
  Filled 2021-01-04: qty 1000

## 2021-01-04 MED ORDER — CLOPIDOGREL BISULFATE 75 MG PO TABS
ORAL_TABLET | ORAL | Status: DC | PRN
  Administered 2021-01-04: 600 mg via ORAL

## 2021-01-04 MED ORDER — LIDOCAINE HCL 1 % IJ SOLN
INTRAMUSCULAR | Status: AC
  Filled 2021-01-04: qty 20

## 2021-01-04 MED ORDER — CLOPIDOGREL BISULFATE 75 MG PO TABS
75.0000 mg | ORAL_TABLET | Freq: Every day | ORAL | Status: DC
  Administered 2021-01-05: 75 mg via ORAL
  Filled 2021-01-04: qty 1

## 2021-01-04 MED ORDER — CARVEDILOL 6.25 MG PO TABS: 6.2500 mg | ORAL_TABLET | Freq: Two times a day (BID) | ORAL | Status: DC

## 2021-01-04 MED ORDER — HEPARIN SODIUM (PORCINE) 1000 UNIT/ML IJ SOLN
INTRAMUSCULAR | Status: AC
  Filled 2021-01-04: qty 1

## 2021-01-04 MED ORDER — SODIUM CHLORIDE 0.9 % IV SOLN: 250.0000 mL | INTRAVENOUS | Status: DC | PRN

## 2021-01-04 MED ORDER — ASPIRIN 81 MG PO CHEW
CHEWABLE_TABLET | ORAL | Status: DC | PRN
  Administered 2021-01-04: 243 mg via ORAL

## 2021-01-04 MED ORDER — ASPIRIN EC 81 MG PO TBEC
81.0000 mg | DELAYED_RELEASE_TABLET | Freq: Every day | ORAL | Status: DC
  Administered 2021-01-05: 81 mg via ORAL
  Filled 2021-01-04: qty 1

## 2021-01-04 MED ORDER — SODIUM CHLORIDE 0.9% FLUSH: 3.0000 mL | INTRAVENOUS | Status: DC | PRN

## 2021-01-04 MED ORDER — NITROGLYCERIN 1 MG/10 ML FOR IR/CATH LAB
INTRA_ARTERIAL | Status: DC | PRN
  Administered 2021-01-04: 200 ug via INTRACORONARY

## 2021-01-04 MED ORDER — NITROGLYCERIN 1 MG/10 ML FOR IR/CATH LAB
INTRA_ARTERIAL | Status: AC
  Filled 2021-01-04: qty 10

## 2021-01-04 MED ORDER — PRIMIDONE 50 MG PO TABS
50.0000 mg | ORAL_TABLET | Freq: Every day | ORAL | Status: DC
  Administered 2021-01-04: 50 mg via ORAL
  Filled 2021-01-04 (×2): qty 1

## 2021-01-04 MED ORDER — MIDAZOLAM HCL 2 MG/2ML IJ SOLN: INTRAMUSCULAR | Status: DC | PRN

## 2021-01-04 MED ORDER — VERAPAMIL HCL 2.5 MG/ML IV SOLN
INTRAVENOUS | Status: AC
  Filled 2021-01-04: qty 2

## 2021-01-04 MED ORDER — SODIUM CHLORIDE 0.9 % IV SOLN: INTRAVENOUS | Status: AC

## 2021-01-04 MED ORDER — ACETAMINOPHEN 325 MG PO TABS: 650.0000 mg | ORAL_TABLET | ORAL | Status: DC | PRN

## 2021-01-04 MED ORDER — ASPIRIN 81 MG PO CHEW
CHEWABLE_TABLET | ORAL | Status: AC
  Filled 2021-01-04: qty 3

## 2021-01-04 MED ORDER — MIDAZOLAM HCL 2 MG/2ML IJ SOLN
INTRAMUSCULAR | Status: DC | PRN
  Administered 2021-01-04: 1 mg via INTRAVENOUS

## 2021-01-04 MED ORDER — SODIUM CHLORIDE 0.9% FLUSH
3.0000 mL | Freq: Two times a day (BID) | INTRAVENOUS | Status: DC
  Administered 2021-01-04 (×2): 3 mL via INTRAVENOUS

## 2021-01-04 MED ORDER — HEPARIN SODIUM (PORCINE) 1000 UNIT/ML IJ SOLN
INTRAMUSCULAR | Status: DC | PRN
  Administered 2021-01-04: 5000 [IU] via INTRAVENOUS
  Administered 2021-01-04: 4500 [IU] via INTRAVENOUS

## 2021-01-04 MED ORDER — VERAPAMIL HCL 2.5 MG/ML IV SOLN
INTRAVENOUS | Status: DC | PRN
  Administered 2021-01-04: 2.5 mg via INTRAVENOUS

## 2021-01-04 MED ORDER — LIDOCAINE HCL (PF) 1 % IJ SOLN
INTRAMUSCULAR | Status: DC | PRN
  Administered 2021-01-04: 2 mL

## 2021-01-04 MED ORDER — HEPARIN (PORCINE) IN NACL 2000-0.9 UNIT/L-% IV SOLN
INTRAVENOUS | Status: DC | PRN
Start: 2021-01-04 — End: 2021-01-04
  Administered 2021-01-04: 1000 mL

## 2021-01-04 MED ORDER — MIDAZOLAM HCL 2 MG/2ML IJ SOLN
INTRAMUSCULAR | Status: AC
  Filled 2021-01-04: qty 2

## 2021-01-04 MED ORDER — CLOPIDOGREL BISULFATE 300 MG PO TABS
ORAL_TABLET | ORAL | Status: AC
  Filled 2021-01-04: qty 2

## 2021-01-04 MED ORDER — TAMSULOSIN HCL 0.4 MG PO CAPS
0.4000 mg | ORAL_CAPSULE | Freq: Every day | ORAL | Status: DC
  Administered 2021-01-04: 0.4 mg via ORAL
  Filled 2021-01-04: qty 1

## 2021-01-04 MED ORDER — SODIUM CHLORIDE 0.9% FLUSH: 3.0000 mL | Freq: Two times a day (BID) | INTRAVENOUS | Status: DC

## 2021-01-04 MED ORDER — SODIUM CHLORIDE 0.9 % WEIGHT BASED INFUSION
3.0000 mL/kg/h | INTRAVENOUS | Status: DC
  Administered 2021-01-04: 3 mL/kg/h via INTRAVENOUS

## 2021-01-04 MED ORDER — ONDANSETRON HCL 4 MG/2ML IJ SOLN: 4.0000 mg | Freq: Four times a day (QID) | INTRAMUSCULAR | Status: DC | PRN

## 2021-01-04 MED ORDER — ATORVASTATIN CALCIUM 20 MG PO TABS
40.0000 mg | ORAL_TABLET | Freq: Every day | ORAL | Status: DC
  Administered 2021-01-04: 40 mg via ORAL
  Filled 2021-01-04: qty 2

## 2021-01-04 MED ORDER — FENTANYL CITRATE (PF) 100 MCG/2ML IJ SOLN
INTRAMUSCULAR | Status: DC | PRN
  Administered 2021-01-04: 25 ug via INTRAVENOUS

## 2021-01-04 SURGICAL SUPPLY — 23 items
BALLN MINITREK RX 2.0X12 (BALLOONS) ×3
BALLN ~~LOC~~ EUPHORA RX 3.5X15 (BALLOONS) ×3
BALLOON MINITREK RX 2.0X12 (BALLOONS) ×2 IMPLANT
BALLOON ~~LOC~~ EUPHORA RX 3.5X15 (BALLOONS) ×2 IMPLANT
CATH BALLN WEDGE 5F 110CM (CATHETERS) ×3 IMPLANT
CATH INFINITI 5 FR JL3.5 (CATHETERS) ×3 IMPLANT
CATH INFINITI JR4 5F (CATHETERS) ×3 IMPLANT
CATH VISTA GUIDE 6FR JR4 (CATHETERS) ×3 IMPLANT
DEVICE RAD TR BAND REGULAR (VASCULAR PRODUCTS) ×3 IMPLANT
DRAPE BRACHIAL (DRAPES) ×6 IMPLANT
GLIDESHEATH SLEND SS 6F .021 (SHEATH) ×3 IMPLANT
GUIDEWIRE INQWIRE 1.5J.035X260 (WIRE) ×2 IMPLANT
INQWIRE 1.5J .035X260CM (WIRE) ×3
KIT ENCORE 26 ADVANTAGE (KITS) ×3 IMPLANT
PACK CARDIAC CATH (CUSTOM PROCEDURE TRAY) ×3 IMPLANT
PROTECTION STATION PRESSURIZED (MISCELLANEOUS) ×3
SET ATX SIMPLICITY (MISCELLANEOUS) ×3 IMPLANT
SHEATH GLIDE SLENDER 4/5FR (SHEATH) ×3 IMPLANT
STATION PROTECTION PRESSURIZED (MISCELLANEOUS) ×2 IMPLANT
STENT ONYX FRONTIER 3.5X22 (Permanent Stent) ×3 IMPLANT
TUBING CIL FLEX 10 FLL-RA (TUBING) ×3 IMPLANT
VALVE COPILOT STAT (MISCELLANEOUS) ×3 IMPLANT
WIRE ASAHI PROWATER 180CM (WIRE) ×3 IMPLANT

## 2021-01-04 NOTE — Progress Notes (Signed)
Patient's brachial site had bloody seepage and swelling at site. Replaced pressure dressing, and placed ice. Spoke to [employer] from [employer].

## 2021-01-04 NOTE — Progress Notes (Signed)
PHARMACIST - PHYSICIAN ORDER COMMUNICATION  CONCERNING: P&T Medication Policy on Herbal Medications  DESCRIPTION:  This patient's order for:  probiotic  has been noted.  This product(s) is classified as an "herbal" or natural product. Due to a lack of definitive safety studies or FDA approval, nonstandard manufacturing practices, plus the potential risk of unknown drug-drug interactions while on inpatient medications, the Pharmacy and Therapeutics Committee does not permit the use of "herbal" or natural products of this type within [facility].   ACTION TAKEN: The pharmacy department is unable to verify this order at this time and your patient has been informed of this safety policy. Please reevaluate patient's clinical condition at discharge and address if the herbal or natural product(s) should be resumed at that time.   Tawnya Crook, PharmD, BCPS Clinical Pharmacist 01/04/2021 1:16 PM'

## 2021-01-04 NOTE — Interval H&P Note (Signed)
History and Physical Interval Note:  01/04/2021 7:38 AM  Steve Warren  has presented today for surgery, with the diagnosis of RT and LT Cath   Chest pain.  The various methods of treatment have been discussed with the patient and family. After consideration of risks, benefits and other options for treatment, the patient has consented to  Procedure(s): RIGHT/LEFT HEART CATH AND CORONARY ANGIOGRAPHY (Bilateral) as a surgical intervention.  The patient's history has been reviewed, patient examined, no change in status, stable for surgery.  I have reviewed the patient's chart and labs.  Questions were answered to the patient's satisfaction.     [place]

## 2021-01-04 NOTE — Interval H&P Note (Signed)
History and Physical Interval Note:  01/04/2021 7:45 AM  Steve Warren  has presented today for surgery, with the diagnosis of stable angina.  The various methods of treatment have been discussed with the patient and family. After consideration of risks, benefits and other options for treatment, the patient has consented to  Procedure(s): RIGHT/LEFT HEART CATH AND CORONARY ANGIOGRAPHY (Bilateral) as a surgical intervention.  The patient's history has been reviewed, patient examined, no change in status, stable for surgery.  I have reviewed the patient's chart and labs.  Questions were answered to the patient's satisfaction.    Cath Lab Visit (complete for each Cath Lab visit)  Clinical Evaluation Leading to the Procedure:   ACS: No.  Non-ACS:    Anginal Classification: CCS III  Anti-ischemic medical therapy: Minimal Therapy (1 class of medications)  Non-Invasive Test Results: Low-risk stress test findings: cardiac mortality <1%/year  Prior CABG: No previous CABG  Steve Warren

## 2021-01-05 DIAGNOSIS — I2510 Atherosclerotic heart disease of native coronary artery without angina pectoris: Secondary | ICD-10-CM

## 2021-01-05 DIAGNOSIS — I208 Other forms of angina pectoris: Secondary | ICD-10-CM | POA: Diagnosis not present

## 2021-01-05 DIAGNOSIS — I519 Heart disease, unspecified: Secondary | ICD-10-CM

## 2021-01-05 DIAGNOSIS — I25118 Atherosclerotic heart disease of native coronary artery with other forms of angina pectoris: Secondary | ICD-10-CM | POA: Diagnosis not present

## 2021-01-05 LAB — BASIC METABOLIC PANEL
Anion gap: 7 (ref 5–15)
BUN: 22 mg/dL (ref 8–23)
CO2: 24 mmol/L (ref 22–32)
Calcium: 8.7 mg/dL — ABNORMAL LOW (ref 8.9–10.3)
Chloride: 102 mmol/L (ref 98–111)
Creatinine, Ser: 1.13 mg/dL (ref 0.61–1.24)
GFR, Estimated: >60 mL/min (ref >60)
Glucose, Bld: 88 mg/dL (ref 70–99)
Potassium: 4.3 mmol/L (ref 3.5–5.1)
Sodium: 133 mmol/L — ABNORMAL LOW (ref 135–145)

## 2021-01-05 LAB — CBC
HCT: 44.3 % (ref 39.0–52.0)
Hemoglobin: 14.9 g/dL (ref 13.0–17.0)
MCH: 31.4 pg (ref 26.0–34.0)
MCHC: 33.6 g/dL (ref 30.0–36.0)
MCV: 93.3 fL (ref 80.0–100.0)
Platelets: 158 10*3/uL (ref 150–400)
RBC: 4.75 MIL/uL (ref 4.22–5.81)
RDW: 13.2 % (ref 11.5–15.5)
WBC: 8.1 10*3/uL (ref 4.0–10.5)
nRBC: 0 % (ref 0.0–0.2)

## 2021-01-05 MED ORDER — CLOPIDOGREL BISULFATE 75 MG PO TABS: 75.0000 mg | ORAL_TABLET | Freq: Every day | ORAL | 11 refills | Status: DC

## 2021-01-05 NOTE — Progress Notes (Signed)
Patient discharging home. Vital signs stable at time of discharge as reflected in discharge summary. Discharge instructions given and verbal understanding returned. Follow up appointments scheduled. No questions at this time.

## 2021-01-05 NOTE — Discharge Summary (Signed)
Discharge Summary    Patient ID: Steve Warren  MRN: [id number], DOB/AGE: [birth year] 75 y.o.  Admit Date: 01/04/2021 Discharge Date: 01/05/2021  Primary Care Provider: Sofie Hartigan, MD Primary Cardiologist: Dr. Saunders Revel, MD  Discharge Diagnoses    Principal Problem:   Stable angina [facility]) Active Problems:   CAD in native artery   Systolic dysfunction   Stage 3 chronic kidney disease   Hyperlipidemia   Allergies No Known Allergies   History of Present Illness     75 year old male with history of coronary artery calcifications, aortic atherosclerosis, systolic dysfunction, CKD stage IIIa, MGUS, essential tremor, chronic dizziness, HLD, BPH, and GERD who presented to [facility] on 01/04/2021 for outpatient diagnostic R/LHC.       He was admitted to [facility] on 7/4 with a 4-day history of intermittent chest discomfort described as a sharp sensation in the center of his chest that radiated to his back and seemed to be associated primarily with activity, though also wondered if eating precipitated it.  There was some shortness of breath when the pain occurred.  High-sensitivity troponin peaked at 86.  BNP 112.  EKG demonstrated sinus rhythm with no acute ischemic ST-T changes. Chest x-ray showed low lung volumes without acute abnormality.  CTA chest/abdomen/pelvis showed no acute vascular abnormality with aortic atherosclerosis as well as at least two-vessel coronary artery calcifications, stable pulmonary nodules, colonic diverticulosis without acute diverticulitis, and severe degenerative changes within the lower lumbar spine noted.  Echo showed an EF of 45 to 50%, mild LVH, dyskinesis of the left ventricular, basal mid inferolateral wall, and inferior wall, grade 1 diastolic dysfunction, normal RV systolic function and ventricular cavity size, and trivial mitral regurgitation.  Lexiscan MPI showed no evidence of significant ischemia and was overall low risk.   He was seen in follow-up later in  11/2020 and was doing reasonably well from a cardiac perspective.  He did continue to note randomly occurring intermittent episodes of chest discomfort that were not as severe as what he experienced leading to his hospital admission.  It was noted he was experiencing episodes of hypotension following transition from Toprol tocarvedilol by outside office.  In this setting, he was transitioned back to Toprol.  Given his stable to improving symptoms, continued medical therapy was recommended.  Earlier this month we received a MyChart message that the patient had experienced progressive exertional dyspnea and angina that improved with rest.  In this setting it was recommended he undergo outpatient diagnostic cath on 01/04/2021.   Hospital Course     Consultants: Cardiac rehab    He presented for outpatient diagnostic cath on 01/04/2021 which demonstrated chronic total/subtotal occlusion of the mid RCA as well as 30% proximal and mid LAD stenoses.  He underwent successful PCI to the mid RCA with placement of a 3.5 x 22 mm Onyx DES with excellent angiographic result and TIMI-3 flow.  Normal LVEDP of 15 mmHg.  Post cath vitals and labs were stable.  He has ambulated without difficulty.    CAD involving the native coronary arteries with exertional angina:  -No chest pain  -Continue DAPT with ASA and clopidogrel without interruption for at least the next 6 months, ideally longer -Lipitor -Toprol XL  -Post-cath instructions -Cardiac rehab   Systolic dysfunction:  -PTA Toprol XL 12.5 mg  -Relative hypotension precludes escalation of GDMT -Advance evidence-based medical therapy as able -Follow up limited echo in the outpatient setting in a couple months to reassess LVSF following percutaneous  coronary intervention and on maximally tolerated GDMT  CKD stage IIIa:  -Stable post cath  HLD:  -LDL 77 with normal ALT/AST in 11/2020, while on simvastatin, with goal being less than 70  -Currently on  atorvastatin, which will be continued  -Follow-up fasting lipid panel and liver function in the outpatient setting in approximately 4 to 6     The patient's right radial cath site has been examined is healing well without issues at this time. The patient has been seen by Dr. Garen Lah and felt to be stable for discharge today. All follow up appointments have been made. Discharge medications are listed below. Prescriptions have been reviewed with the patient and sent in to their pharmacy.  _____________  Discharge Vitals Blood pressure 136/74, pulse 67, temperature 97.9 F (36.6 C), resp. rate 17, height _0  (1.803 m), weight 96.5 kg, SpO2 100 %.  Filed Weights   01/04/21 0700 01/04/21 1310  Weight: 95.3 kg 96.5 kg   GEN: No acute distress.   Neck: No JVD. Cardiac: RRR, no murmurs, rubs, or gallops. Right radial cardiac cath site is without bleeding, bruising, swelling, warmth, erythema, or TTP. Radial pulse 2+. Mild bruising noted along the right brachial venous access site without active bleeding.  Respiratory: Clear to auscultation bilaterally.  GI: Soft, nontender, non-distended.   MS: No edema; No deformity. Neuro:  Alert and oriented x 3; Nonfocal.  Psych: Normal affect.  Labs & Radiologic Studies    CBC Recent Labs    01/05/21 0431  WBC 8.1  HGB 14.9  HCT 44.3  MCV 93.3  PLT 027   Basic Metabolic Panel Recent Labs    01/05/21 0431  NA 133*  K 4.3  CL 102  CO2 24  GLUCOSE 88  BUN 22  CREATININE 1.13  CALCIUM 8.7*   Liver Function Tests No results for input(s): AST, ALT, ALKPHOS, BILITOT, PROT, ALBUMIN in the last 72 hours. No results for input(s): LIPASE, AMYLASE in the last 72 hours. Cardiac Enzymes No results for input(s): CKTOTAL, CKMB, CKMBINDEX, TROPONINI in the last 72 hours. BNP Invalid input(s): POCBNP D-Dimer No results for input(s): DDIMER in the last 72 hours. Hemoglobin A1C No results for input(s): HGBA1C in the last 72 hours. Fasting  Lipid Panel No results for input(s): CHOL, HDL, LDLCALC, TRIG, CHOLHDL, LDLDIRECT in the last 72 hours. Thyroid Function Tests No results for input(s): TSH, T4TOTAL, T3FREE, THYROIDAB in the last 72 hours.  Invalid input(s): FREET3 _____________    Diagnostic Studies/Procedures   R/LHC 01/04/2021: Conclusion Coronary artery disease including chronic total/subtotal occlusion of the mid RCA, and 30% proximal and mid LAD. Normal left ventricular end-diastolic pressure of 15 mmHg RA 8, RV 25/11, PCP WP 11, PA 29/10 (19) CO 6.77, CI 3.15 Successful PCI to the mid RCA with placement of a 3.5 x 22 mm Onyx DES with excellent angiographic result and TIMI-3 flow   Recommendation Dual antiplatelet therapy with aspirin and Plavix for 6 months, ideally longer Ongoing aggressive secondary prevention Admit overnight for monitoring.  Discharge in the morning.   Diagnostic Dominance: Right Left Anterior Descending  Prox LAD to Mid LAD lesion is 30% stenosed.  First Diagonal Branch  Vessel is large in size.  Left Circumflex  The vessel exhibits minimal luminal irregularities.  First Obtuse Marginal Branch  Vessel is small in size.  Third Obtuse Marginal Branch  Vessel is small in size.  Right Coronary Artery  Prox RCA lesion is 100% stenosed. Vessel is the culprit lesion.  The lesion is not complex (non high-C) and chronically occluded with bridging and left-to-right collateral flow.  Intervention  Prox RCA lesion  Stent  Lesion length: 18 mm. Pre-stent angioplasty was performed using a BALLOON [place] RX 2.0X12. Maximum pressure: 10 atm. A drug-eluting stent was successfully placed using a STENT ONYX FRONTIER 3.5X22. Maximum pressure: 12 atm. Stent strut is well apposed. Post-stent angioplasty was performed using a BALLOON [place] EUPHORA RX 3.5X15. Maximum pressure: 16 atm.  Post-Intervention Lesion Assessment  There is a 0% residual stenosis post intervention.   Left Heart  Left Ventricle  LV end diastolic pressure is normal.  Aortic Valve There is no aortic valve stenosis.   Coronary Diagrams  Diagnostic Dominance: Right Intervention   _____________  Disposition   Pt is being discharged home today in good condition.  Follow-up Plans & Appointments     Follow-up Information     Rise Mu, PA-C Follow up on 01/31/2021.   Specialties: Physician Assistant, Cardiology, Radiology Why: Appointment time 10 AM Contact information: [address] [place]  [id number] [phone]                Discharge Instructions     AMB Referral to Cardiac Rehabilitation - Phase II   Complete by: As directed    Diagnosis: Coronary Stents   After initial evaluation and assessments completed: Virtual Based Care may be provided alone or in conjunction with Phase 2 Cardiac Rehab based on patient barriers.: Yes   Diet - low sodium heart healthy   Complete by: As directed    Increase activity slowly   Complete by: As directed        Discharge Medications   Allergies as of 01/05/2021   No Known Allergies      Medication List     STOP taking these medications    carvedilol 6.25 MG tablet Commonly known as: COREG       TAKE these medications    aspirin 81 MG EC tablet Take 1 tablet (81 mg total) by mouth daily. Swallow whole.   atorvastatin 40 MG tablet Commonly known as: LIPITOR Take 1 tablet (40 mg total) by mouth daily. What changed: when to take this   clopidogrel 75 MG tablet Commonly known as: PLAVIX Take 1 tablet (75 mg total) by mouth daily.   metoprolol succinate 25 MG 24 hr tablet Commonly known as: Toprol XL Take 0.5 tablets (12.5 mg total) by mouth daily.   primidone 50 MG tablet Commonly known as: MYSOLINE Take 50 mg by mouth at bedtime.   Probiotic 1-250 BILLION-MG Caps Take 1 capsule by mouth daily.   SYSTANE COMPLETE OP Place 1 drop into both eyes in the morning, at noon, and at bedtime.   tamsulosin 0.4  MG Caps capsule Commonly known as: FLOMAX Take 0.4 mg by mouth daily after supper.   VITAMIN B 12 PO Take 1,000 mcg by mouth daily at 12 noon.          Aspirin prescribed at discharge?  Yes High Intensity Statin Prescribed? (Lipitor 40-55m or Crestor 20-413m: Yes Beta Blocker Prescribed? Yes For EF <40%, was ACEI/ARB Prescribed? No: EF > 40% ADP Receptor Inhibitor Prescribed? (i.e. Plavix etc.-Includes Medically Managed Patients): Yes For EF <40%, Aldosterone Inhibitor Prescribed? No: EF > 40% Was EF assessed during THIS hospitalization? No: recent echo Was Cardiac Rehab II ordered? (Included Medically managed Patients): Yes   Outstanding Labs/Studies   None.  Duration of Discharge Encounter   Greater  than 30 minutes including physician time.  Signed, Rise Mu, PA-C [facility] Pager: [phone] 01/05/2021, 8:50 AM

## 2021-01-05 NOTE — Plan of Care (Signed)
Patient slept few hours, stated uncomfortable to lay on one side. Refused to position to left side. Dressing to exit sites dry intact, dressing to brachial with dry blood and old bruises, radial pulses +2, no  new bleeding , bruises or hematoma noted tonight. Patient denied pain to affected areas. SB and NSR on monitor, no BM, patient monitored closely.   Problem: Education: Goal: Knowledge of General Education information will improve Description: Including pain rating scale, medication(s)/side effects and non-pharmacologic comfort measures Outcome: Progressing   Problem: Health Behavior/Discharge Planning: Goal: Ability to manage health-related needs will improve Outcome: Progressing   Problem: Clinical Measurements: Goal: Ability to maintain clinical measurements within normal limits will improve Outcome: Progressing Goal: Will remain free from infection Outcome: Progressing Goal: Diagnostic test results will improve Outcome: Progressing Goal: Respiratory complications will improve Outcome: Progressing Goal: Cardiovascular complication will be avoided Outcome: Progressing   Problem: Activity: Goal: Risk for activity intolerance will decrease Outcome: Progressing   Problem: Nutrition: Goal: Adequate nutrition will be maintained Outcome: Progressing   Problem: Coping: Goal: Level of anxiety will decrease Outcome: Progressing   Problem: Elimination: Goal: Will not experience complications related to bowel motility Outcome: Progressing Goal: Will not experience complications related to urinary retention Outcome: Progressing   Problem: Pain Managment: Goal: General experience of comfort will improve Outcome: Progressing   Problem: Safety: Goal: Ability to remain free from injury will improve Outcome: Progressing   Problem: Skin Integrity: Goal: Risk for impaired skin integrity will decrease Outcome: Progressing

## 2021-01-12 ENCOUNTER — Encounter: Payer: Self-pay | Admitting: Oncology

## 2021-01-27 ENCOUNTER — Other Ambulatory Visit: Payer: Self-pay

## 2021-01-27 ENCOUNTER — Other Ambulatory Visit
Admission: RE | Admit: 2021-01-27 | Discharge: 2021-01-27 | Disposition: A | Discharge status: Home / Self Care | Payer: Medicare PPO | Attending: Physician Assistant | Admitting: Physician Assistant

## 2021-01-27 DIAGNOSIS — N1831 Chronic kidney disease, stage 3a: Secondary | ICD-10-CM | POA: Insufficient documentation

## 2021-01-27 DIAGNOSIS — I25118 Atherosclerotic heart disease of native coronary artery with other forms of angina pectoris: Secondary | ICD-10-CM | POA: Insufficient documentation

## 2021-01-27 DIAGNOSIS — D472 Monoclonal gammopathy: Secondary | ICD-10-CM | POA: Insufficient documentation

## 2021-01-27 DIAGNOSIS — E785 Hyperlipidemia, unspecified: Secondary | ICD-10-CM | POA: Diagnosis present

## 2021-01-27 LAB — CBC WITH DIFFERENTIAL/PLATELET
Abs Immature Granulocytes: 0.03 10*3/uL (ref 0.00–0.07)
Basophils Absolute: 0 10*3/uL (ref 0.0–0.1)
Basophils Relative: 0 %
Eosinophils Absolute: 0.2 10*3/uL (ref 0.0–0.5)
Eosinophils Relative: 2 %
HCT: 45.4 % (ref 39.0–52.0)
Hemoglobin: 15.3 g/dL (ref 13.0–17.0)
Immature Granulocytes: 0 %
Lymphocytes Relative: 10 %
Lymphs Abs: 0.7 10*3/uL (ref 0.7–4.0)
MCH: 31 pg (ref 26.0–34.0)
MCHC: 33.7 g/dL (ref 30.0–36.0)
MCV: 91.9 fL (ref 80.0–100.0)
Monocytes Absolute: 0.8 10*3/uL (ref 0.1–1.0)
Monocytes Relative: 11 %
Neutro Abs: 5.4 10*3/uL (ref 1.7–7.7)
Neutrophils Relative %: 77 %
Platelets: 188 10*3/uL (ref 150–400)
RBC: 4.94 MIL/uL (ref 4.22–5.81)
RDW: 13.2 % (ref 11.5–15.5)
WBC: 7.1 10*3/uL (ref 4.0–10.5)
nRBC: 0 % (ref 0.0–0.2)

## 2021-01-27 LAB — LIPID PANEL
Cholesterol: 116 mg/dL (ref 0–200)
HDL: 36 mg/dL — ABNORMAL LOW (ref >40)
LDL Cholesterol: 60 mg/dL (ref 0–99)
Total CHOL/HDL Ratio: 3.2 RATIO
Triglycerides: 99 mg/dL (ref <150)
VLDL: 20 mg/dL (ref 0–40)

## 2021-01-27 LAB — COMPREHENSIVE METABOLIC PANEL
ALT: 18 U/L (ref 0–44)
AST: 16 U/L (ref 15–41)
Albumin: 4.3 g/dL (ref 3.5–5.0)
Alkaline Phosphatase: 70 U/L (ref 38–126)
Anion gap: 8 (ref 5–15)
BUN: 24 mg/dL — ABNORMAL HIGH (ref 8–23)
CO2: 25 mmol/L (ref 22–32)
Calcium: 9.2 mg/dL (ref 8.9–10.3)
Chloride: 98 mmol/L (ref 98–111)
Creatinine, Ser: 1.49 mg/dL — ABNORMAL HIGH (ref 0.61–1.24)
GFR, Estimated: 48 mL/min — ABNORMAL LOW (ref >60)
Glucose, Bld: 108 mg/dL — ABNORMAL HIGH (ref 70–99)
Potassium: 4.8 mmol/L (ref 3.5–5.1)
Sodium: 131 mmol/L — ABNORMAL LOW (ref 135–145)
Total Bilirubin: 1.1 mg/dL (ref 0.3–1.2)
Total Protein: 8.3 g/dL — ABNORMAL HIGH (ref 6.5–8.1)

## 2021-01-28 LAB — KAPPA/LAMBDA LIGHT CHAINS
Kappa free light chain: 162.8 mg/L — ABNORMAL HIGH (ref 3.3–19.4)
Kappa, lambda light chain ratio: 11.15 — ABNORMAL HIGH (ref 0.26–1.65)
Lambda free light chains: 14.6 mg/L (ref 5.7–26.3)

## 2021-01-29 ENCOUNTER — Encounter: Payer: Self-pay | Admitting: Physician Assistant

## 2021-01-29 NOTE — Progress Notes (Signed)
Cardiology Office Note    Date:  01/31/2021   ID:  Steve Warren, DOB [birth year], MRN [id number]  PCP:  Steve Hartigan, MD  Cardiologist:  Nelva Bush, MD  Electrophysiologist:  None   Chief Complaint: Follow up  History of Present Illness:   Steve Warren is a 75 y.o. male with history of CAD status post PCI to the RCA on 01/04/2021, aortic atherosclerosis, systolic dysfunction, CKD stage IIIa, MGUS, essential tremor, chronic dizziness, HLD, BPH, and GERD who presents for hospital follow-up following outpatient diagnostic R/LHC.  He was admitted to [facility] on 7/4 with a 4-day history of intermittent chest discomfort described as a sharp sensation in the center of his chest that radiated to his back and seemed to be associated primarily with activity, though also wondered if eating precipitated it.  There was some shortness of breath when the pain occurred.  High-sensitivity troponin peaked at 86.  BNP 112.  EKG demonstrated sinus rhythm with no acute ischemic ST-T changes. Chest x-ray showed low lung volumes without acute abnormality.  CTA chest/abdomen/pelvis showed no acute vascular abnormality with aortic atherosclerosis as well as at least two-vessel coronary artery calcifications, stable pulmonary nodules, colonic diverticulosis without acute diverticulitis, and severe degenerative changes within the lower lumbar spine noted.  Echo showed an EF of 45 to 50%, mild LVH, dyskinesis of the left ventricular, basal mid inferolateral wall, and inferior wall, grade 1 diastolic dysfunction, normal RV systolic function and ventricular cavity size, and trivial mitral regurgitation.  Lexiscan MPI showed no evidence of significant ischemia and was overall low risk.   He was seen in follow-up later in 11/2020 and was doing reasonably well from a cardiac perspective.  He did continue to note randomly occurring intermittent episodes of chest discomfort that were not as severe as what he experienced  leading to his hospital admission.  It was noted he was experiencing episodes of hypotension following transition from Toprol tocarvedilol by outside office.  In this setting, he was transitioned back to Toprol.  Given his stable to improving symptoms, continued medical therapy was recommended.  However, we received a message later that month that he was experiencing worsening exertional dyspnea and angina that improved with rest.  In this setting, he underwent diagnostic R/LHC on 01/04/2021 which demonstrated chronic total/subtotal occlusion of the mid RCA as well as 30% proximal and mid LAD stenoses.  He underwent successful PCI to the mid RCA.  Normal LVEDP.  He comes in accompanied by his wife today and is doing well from a cardiac perspective.  Following his PCI he was experiencing some dizziness.  Upon his wife reviewing his medications, it was discovered he was taking a full 25 mg Toprol-XL and had associated hypotension with BP in the 90s over 50s.  Since going back to a half tab of metoprolol his BP has improved to the 1 teens to [id number] systolic with resolution of dizziness.  Post PCI he notes resolution of exertional/ambulatory chest pain and dyspnea.  No falls, hematochezia, melena, hemoptysis, hematemesis, or hematuria.  He is tolerating all medications without issues and has not missed any doses of DAPT.  He is interested in participating with cardiac rehab.   Labs independently reviewed: 01/2021 - Hgb 15.3, PLT 188, potassium 4.8, BUN 24, serum creatinine 1.49, TC 116, TG 99, HDL 36, LDL 60 11/2020 - albumin 4.2, AST/ALT normal, magnesium 1.9  Past Medical History:  Diagnosis Date   Anxiety    BPH (benign prostatic hyperplasia)  CAD in native artery    Cataract    bilateral- Dec 2021 and jan 2022   GERD (gastroesophageal reflux disease)    Hypercholesterolemia    Hyperlipidemia    Hypertension     Past Surgical History:  Procedure Laterality Date   APPENDECTOMY     CATARACT  EXTRACTION, BILATERAL     COLONOSCOPY     CORONARY STENT INTERVENTION N/A 01/04/2021   Procedure: CORONARY STENT INTERVENTION;  Surgeon: Nelva Bush, MD;  Location: [place] [place] [facility];  Service: Cardiovascular;  Laterality: N/A;   RIGHT/LEFT HEART CATH AND CORONARY ANGIOGRAPHY Bilateral 01/04/2021   Procedure: RIGHT/LEFT HEART CATH AND CORONARY ANGIOGRAPHY;  Surgeon: Nelva Bush, MD;  Location: [place] [facility];  Service: Cardiovascular;  Laterality: Bilateral;   TONSILLECTOMY      Current Medications: Current Meds  Medication Sig   aspirin EC 81 MG EC tablet Take 1 tablet (81 mg total) by mouth daily. Swallow whole.   atorvastatin (LIPITOR) 40 MG tablet Take 1 tablet (40 mg total) by mouth daily.   Bacillus Coagulans-Inulin (PROBIOTIC) 1-250 BILLION-MG CAPS Take 1 capsule by mouth daily.   clopidogrel (PLAVIX) 75 MG tablet Take 1 tablet (75 mg total) by mouth daily.   Cyanocobalamin (VITAMIN B 12 PO) Take 1,000 mcg by mouth daily at 12 noon.   primidone (MYSOLINE) 50 MG tablet Take 50 mg by mouth at bedtime.   Propylene Glycol (SYSTANE COMPLETE OP) Place 1 drop into both eyes in the morning, at noon, and at bedtime.   tamsulosin (FLOMAX) 0.4 MG CAPS capsule Take 0.4 mg by mouth daily after supper.   [DISCONTINUED] metoprolol succinate (TOPROL XL) 25 MG 24 hr tablet Take 0.5 tablets (12.5 mg total) by mouth daily.    Allergies:   Patient has no known allergies.   Social History   Socioeconomic History   Marital status: Married    Spouse name: Not on file   Number of children: Not on file   Years of education: Not on file   Highest education level: Not on file  Occupational History   Not on file  Tobacco Use   Smoking status: Former    Types: Cigarettes    Quit date: 2012    Years since quitting: 10.7   Smokeless tobacco: Never  Vaping Use   Vaping Use: Never used  Substance and Sexual Activity   Alcohol use: Yes    Comment: once a month liquor   Drug use:  Never   Sexual activity: Not Currently  Other Topics Concern   Not on file  Social History Narrative   Not on file   Social Determinants of Health   Financial Resource Strain: Not on file  Food Insecurity: Not on file  Transportation Needs: Not on file  Physical Activity: Not on file  Stress: Not on file  Social Connections: Not on file     Family History:  The patient's family history includes Alzheimer's disease in his father; Diabetes in his maternal grandmother.  ROS:   Review of Systems  Constitutional:  Negative for chills, diaphoresis, fever, malaise/fatigue and weight loss.  HENT:  Negative for congestion.   Eyes:  Negative for discharge and redness.  Respiratory:  Negative for cough, hemoptysis, sputum production, shortness of breath and wheezing.   Cardiovascular:  Negative for chest pain, palpitations, orthopnea, claudication, leg swelling and PND.  Gastrointestinal:  Negative for blood in stool and melena.  Genitourinary:  Negative for hematuria.  Musculoskeletal:  Negative for falls  and myalgias.  Skin:  Negative for rash.  Neurological:  Negative for dizziness, tingling, tremors, sensory change, speech change, focal weakness, loss of consciousness and weakness.  Endo/Heme/Allergies:  Does not bruise/bleed easily.  Psychiatric/Behavioral:  Negative for substance abuse. The patient is not nervous/anxious.   All other systems reviewed and are negative.   EKGs/Labs/Other Studies Reviewed:    Studies reviewed were summarized above. The additional studies were reviewed today:  [facility] 12/2020: Conclusion Coronary artery disease including chronic total/subtotal occlusion of the mid RCA, and 30% proximal and mid LAD. Normal left ventricular end-diastolic pressure of 15 mmHg RA 8, RV 25/11, PCP WP 11, PA 29/10 (19) CO 6.77, CI 3.15 Successful PCI to the mid RCA with placement of a 3.5 x 22 mm Onyx DES with excellent angiographic result and TIMI-3 flow    Recommendation Dual antiplatelet therapy with aspirin and Plavix for 6 months, ideally longer Ongoing aggressive secondary prevention Admit overnight for monitoring.  Discharge in the morning. __________  2D echo 11/16/2020: 1. Left ventricular ejection fraction, by estimation, is 45 to 50%. The  left ventricle has mildly decreased function. The left ventricle  demonstrates regional wall motion abnormalities (see scoring  diagram/findings for description). There is mild left  ventricular hypertrophy. Left ventricular diastolic parameters are  consistent with Grade I diastolic dysfunction (impaired relaxation). There  is dyskinesis of the left ventricular, basal-mid inferolateral wall and  inferior wall.   2. Right ventricular systolic function is normal. The right ventricular  size is normal.   3. The mitral valve was not well visualized. Trivial mitral valve  regurgitation.   4. The aortic valve was not well visualized. Aortic valve regurgitation  is not visualized. No aortic stenosis is present. __________   Carlton Adam MPI 11/17/2020: The study is normal. This is a low risk study. The left ventricular ejection fraction is hyperdynamic (>65%). There is no evidence for ischemia    EKG:  EKG is ordered today.  The EKG ordered today demonstrates NSR, 61 bpm, no acute ST-T changes  Recent Labs: 11/15/2020: B Natriuretic Peptide 112.5 11/17/2020: Magnesium 1.9 01/27/2021: ALT 18; BUN 24; Creatinine, Ser 1.49; Hemoglobin 15.3; Platelets 188; Potassium 4.8; Sodium 131  Recent Lipid Panel    Component Value Date/Time   CHOL 116 01/27/2021 0927   TRIG 99 01/27/2021 0927   HDL 36 (L) 01/27/2021 0927   CHOLHDL 3.2 01/27/2021 0927   VLDL 20 01/27/2021 0927   LDLCALC 60 01/27/2021 0927    PHYSICAL EXAM:    VS:  BP 122/80 (BP Location: Left Arm, Patient Position: Sitting, Cuff Size: Normal)   Pulse 61   Ht '5\' 11"'$  (1.803 m)   Wt 209 lb (94.8 kg)   SpO2 96%   BMI 29.15 kg/m   BMI:  Body mass index is 29.15 kg/m.  Physical Exam Vitals reviewed.  Constitutional:      Appearance: He is well-developed.  HENT:     Head: Normocephalic and atraumatic.  Eyes:     General:        Right eye: No discharge.        Left eye: No discharge.  Neck:     Vascular: No JVD.  Cardiovascular:     Rate and Rhythm: Normal rate and regular rhythm.     Pulses:          Posterior tibial pulses are 2+ on the right side and 2+ on the left side.     Heart sounds: Normal heart sounds, S1 normal  and S2 normal. Heart sounds not distant. No midsystolic click and no opening snap. No murmur heard.   No friction rub.     Comments: Right radial cardiac cath site is well-healing without bleeding, bruising, swelling, warmth, erythema, or tenderness to palpation.  Radial pulse 2+ proximal and distal to arteriotomy site. Pulmonary:     Effort: Pulmonary effort is normal. No respiratory distress.     Breath sounds: Normal breath sounds. No decreased breath sounds, wheezing or rales.  Chest:     Chest wall: No tenderness.  Abdominal:     General: There is no distension.     Palpations: Abdomen is soft.     Tenderness: There is no abdominal tenderness.  Musculoskeletal:     Cervical back: Normal range of motion.     Right lower leg: No edema.     Left lower leg: No edema.  Skin:    General: Skin is warm and dry.     Nails: There is no clubbing.  Neurological:     Mental Status: He is alert and oriented to person, place, and time.  Psychiatric:        Speech: Speech normal.        Behavior: Behavior normal.        Thought Content: Thought content normal.        Judgment: Judgment normal.    Wt Readings from Last 3 Encounters:  01/31/21 209 lb (94.8 kg)  01/04/21 212 lb 11.9 oz (96.5 kg)  12/10/20 210 lb (95.3 kg)     ASSESSMENT & PLAN:   CAD involving the native coronary arteries without angina: He is doing well without symptoms concerning for angina.  Continue DAPT with ASA and  clopidogrel without interruption for at least 6 months, ideally longer.  We will also continue secondary prevention with atorvastatin and metoprolol.  Aggressive risk factor modification.  No indication for further ischemic testing at this time.  Systolic dysfunction: He appears euvolemic and well compensated.  Hopefully with PCI and medical therapy we will note an improvement in his LV systolic function in several months time.  I did defer addition of ARB at today's visit given noted hypotension on full dose metoprolol.  For now, he will continue Toprol-XL 12.5 mg daily.  Escalate GDMT as/if able based on vital signs.  Defer addition of MRA given underlying renal dysfunction.  CKD stage IIIa: Stable on recent check earlier this month.  HLD: LDL 60 from 01/2021.  He remains on atorvastatin 40 mg.  Disposition: F/u with Dr. Saunders Revel or an APP in 2 months.   Medication Adjustments/Labs and Tests Ordered: Current medicines are reviewed at length with the patient today.  Concerns regarding medicines are outlined above. Medication changes, Labs and Tests ordered today are summarized above and listed in the Patient Instructions accessible in Encounters.   Signed, Christell Faith, PA-C 01/31/2021 11:45 AM     [address] [place], [place] [address] [phone]

## 2021-01-31 ENCOUNTER — Encounter: Payer: Self-pay | Admitting: Physician Assistant

## 2021-01-31 ENCOUNTER — Ambulatory Visit: Payer: Medicare PPO | Admitting: Physician Assistant

## 2021-01-31 ENCOUNTER — Other Ambulatory Visit: Payer: Self-pay

## 2021-01-31 VITALS — BP 122/80 | HR 61 | Ht 71.0 in | Wt 209.0 lb

## 2021-01-31 DIAGNOSIS — E785 Hyperlipidemia, unspecified: Secondary | ICD-10-CM | POA: Diagnosis not present

## 2021-01-31 DIAGNOSIS — N1831 Chronic kidney disease, stage 3a: Secondary | ICD-10-CM

## 2021-01-31 DIAGNOSIS — I2510 Atherosclerotic heart disease of native coronary artery without angina pectoris: Secondary | ICD-10-CM | POA: Diagnosis not present

## 2021-01-31 DIAGNOSIS — I519 Heart disease, unspecified: Secondary | ICD-10-CM | POA: Diagnosis not present

## 2021-01-31 LAB — MULTIPLE MYELOMA PANEL, SERUM
Albumin SerPl Elph-Mcnc: 4.1 g/dL (ref 2.9–4.4)
Albumin/Glob SerPl: 1.3 (ref 0.7–1.7)
Alpha 1: 0.2 g/dL (ref 0.0–0.4)
Alpha2 Glob SerPl Elph-Mcnc: 0.7 g/dL (ref 0.4–1.0)
B-Globulin SerPl Elph-Mcnc: 0.8 g/dL (ref 0.7–1.3)
Gamma Glob SerPl Elph-Mcnc: 1.8 g/dL (ref 0.4–1.8)
Globulin, Total: 3.4 g/dL (ref 2.2–3.9)
IgA: 77 mg/dL (ref 61–437)
IgG (Immunoglobin G), Serum: 2062 mg/dL — ABNORMAL HIGH (ref 603–1613)
IgM (Immunoglobulin M), Srm: 31 mg/dL (ref 15–143)
M Protein SerPl Elph-Mcnc: 1.3 g/dL — ABNORMAL HIGH
Total Protein ELP: 7.5 g/dL (ref 6.0–8.5)

## 2021-01-31 MED ORDER — METOPROLOL SUCCINATE ER 25 MG PO TB24: 12.5000 mg | ORAL_TABLET | Freq: Every day | ORAL | 1 refills | Status: DC

## 2021-01-31 NOTE — Patient Instructions (Signed)
Medication Instructions:  Your physician recommends that you continue on your current medications as directed. Please refer to the Current Medication list given to you today.  Metoprolol has been refilled today.  *If you need a refill on your cardiac medications before your next appointment, please call your pharmacy*   Lab Work: None ordered If you have labs (blood work) drawn today and your tests are completely normal, you will receive your results only by: [place] (if you have MyChart) OR A paper copy in the mail If you have any lab test that is abnormal or we need to change your treatment, we will call you to review the results.   Testing/Procedures: None ordered   Follow-Up: At [facility], you and your health needs are our priority.  As part of our continuing mission to provide you with exceptional heart care, we have created designated Provider Care Teams.  These Care Teams include your primary Cardiologist (physician) and Advanced Practice Providers (APPs -  Physician Assistants and Nurse Practitioners) who all work together to provide you with the care you need, when you need it.  We recommend signing up for the patient portal called "MyChart".  Sign up information is provided on this After Visit Summary.  MyChart is used to connect with patients for Virtual Visits (Telemedicine).  Patients are able to view lab/test results, encounter notes, upcoming appointments, etc.  Non-urgent messages can be sent to your provider as well.   To learn more about what you can do with MyChart, go to [email or web address].    Your next appointment:   2 month(s)  The format for your next appointment:   In Person  Provider:   You may see Nelva Bush, MD or one of the following Advanced Practice Providers on your designated Care Team:   Murray Hodgkins, NP Christell Faith, PA-C Marrianne Mood, PA-C Cadence Kathlen Mody, [place]   Other Instructions N/A

## 2021-02-11 ENCOUNTER — Encounter: Payer: Medicare PPO | Attending: Internal Medicine | Admitting: *Deleted

## 2021-02-11 ENCOUNTER — Other Ambulatory Visit: Payer: Self-pay

## 2021-02-11 DIAGNOSIS — Z955 Presence of coronary angioplasty implant and graft: Secondary | ICD-10-CM

## 2021-02-11 NOTE — Progress Notes (Signed)
Inital telephone orientation completed. Diagnosis can be found in [facility] 8/23. EP orientation scheduled for Thursday 10/6 at 8am.

## 2021-02-17 ENCOUNTER — Other Ambulatory Visit: Payer: Self-pay

## 2021-02-17 ENCOUNTER — Encounter: Payer: Medicare PPO | Attending: Internal Medicine

## 2021-02-17 ENCOUNTER — Ambulatory Visit: Payer: Medicare PPO | Admitting: Internal Medicine

## 2021-02-17 VITALS — Ht 71.0 in | Wt 209.3 lb

## 2021-02-17 DIAGNOSIS — Z955 Presence of coronary angioplasty implant and graft: Secondary | ICD-10-CM | POA: Diagnosis not present

## 2021-02-17 DIAGNOSIS — Z87891 Personal history of nicotine dependence: Secondary | ICD-10-CM | POA: Insufficient documentation

## 2021-02-17 NOTE — Progress Notes (Signed)
Cardiac Individual Treatment Plan  Patient Details  Name: Steve Warren MRN: [id number] Date of Birth: 1946-05-15 Referring Provider:   Flowsheet Row Cardiac Rehab from 02/17/2021 in [facility] Cardiac and Pulmonary Rehab  Referring Provider End, [name]  MD       Initial Encounter Date:  Flowsheet Row Cardiac Rehab from 02/17/2021 in [facility] Cardiac and Pulmonary Rehab  Date 02/17/21       Visit Diagnosis: Status post coronary artery stent placement  Patient's Home Medications on Admission:  Current Outpatient Medications:    aspirin EC 81 MG EC tablet, Take 1 tablet (81 mg total) by mouth daily. Swallow whole., Disp: 30 tablet, Rfl: 0   atorvastatin (LIPITOR) 40 MG tablet, Take 1 tablet (40 mg total) by mouth daily., Disp: 90 tablet, Rfl: 3   Bacillus Coagulans-Inulin (PROBIOTIC) 1-250 BILLION-MG CAPS, Take 1 capsule by mouth daily., Disp: , Rfl:    clopidogrel (PLAVIX) 75 MG tablet, Take 1 tablet (75 mg total) by mouth daily., Disp: 30 tablet, Rfl: 11   Cyanocobalamin (VITAMIN B 12 PO), Take 1,000 mcg by mouth daily at 12 noon., Disp: , Rfl:    metoprolol succinate (TOPROL XL) 25 MG 24 hr tablet, Take 0.5 tablets (12.5 mg total) by mouth daily., Disp: 45 tablet, Rfl: 1   primidone (MYSOLINE) 50 MG tablet, Take 50 mg by mouth at bedtime., Disp: , Rfl:    Propylene Glycol (SYSTANE COMPLETE OP), Place 1 drop into both eyes in the morning, at noon, and at bedtime., Disp: , Rfl:    tamsulosin (FLOMAX) 0.4 MG CAPS capsule, Take 0.4 mg by mouth daily after supper., Disp: , Rfl:   Past Medical History: Past Medical History:  Diagnosis Date   Anxiety    BPH (benign prostatic hyperplasia)    CAD in native artery    Cataract    bilateral- Dec 2021 and jan 2022   GERD (gastroesophageal reflux disease)    Hypercholesterolemia    Hyperlipidemia    Hypertension     Tobacco Use: Social History   Tobacco Use  Smoking Status Former   Types: Cigarettes   Quit date: 2012   Years since  quitting: 10.7  Smokeless Tobacco Never    Labs: Recent Review Management consultant for [facility] Cardiac and Pulmonary Rehab Latest Ref Rng & Units 11/16/2020 01/27/2021   Cholestrol 0 - 200 mg/dL 128 116   LDLCALC 0 - 99 mg/dL 77 60   HDL >40 mg/dL 40(L) 36(L)   Trlycerides <150 mg/dL 55 99        Exercise Target Goals: Exercise Program Goal: Individual exercise prescription set using results from initial 6 min walk test and THRR while considering  patient's activity barriers and safety.   Exercise Prescription Goal: Initial exercise prescription builds to 30-45 minutes a day of aerobic activity, 2-3 days per week.  Home exercise guidelines will be given to patient during program as part of exercise prescription that the participant will acknowledge.   Education: Aerobic Exercise: - Group verbal and visual presentation on the components of exercise prescription. Introduces F.I.T.T principle from ACSM for exercise prescriptions.  Reviews F.I.T.T. principles of aerobic exercise including progression. Written material given at graduation. Flowsheet Row Cardiac Rehab from 02/17/2021 in [facility] Cardiac and Pulmonary Rehab  Education need identified 02/17/21       Education: Resistance Exercise: - Group verbal and visual presentation on the components of exercise prescription. Introduces F.I.T.T principle from ACSM for exercise prescriptions  Reviews F.I.T.T.  principles of resistance exercise including progression. Written material given at graduation.    Education: Exercise & Equipment Safety: - Individual verbal instruction and demonstration of equipment use and safety with use of the equipment. Flowsheet Row Cardiac Rehab from 02/17/2021 in [facility] Cardiac and Pulmonary Rehab  Education need identified 02/17/21  Date 02/17/21  Educator [place]  Instruction Review Code 1- Verbalizes Understanding       Education: Exercise Physiology & General Exercise Guidelines: - Group verbal and  written instruction with models to review the exercise physiology of the cardiovascular system and associated critical values. Provides general exercise guidelines with specific guidelines to those with heart or lung disease.  Flowsheet Row Cardiac Rehab from 02/17/2021 in [facility] Cardiac and Pulmonary Rehab  Education need identified 02/17/21       Education: Flexibility, Balance, Mind/Body Relaxation: - Group verbal and visual presentation with interactive activity on the components of exercise prescription. Introduces F.I.T.T principle from ACSM for exercise prescriptions. Reviews F.I.T.T. principles of flexibility and balance exercise training including progression. Also discusses the mind body connection.  Reviews various relaxation techniques to help reduce and manage stress (i.e. Deep breathing, progressive muscle relaxation, and visualization). Balance handout provided to take home. Written material given at graduation.   Activity Barriers & Risk Stratification:  Activity Barriers & Cardiac Risk Stratification - 02/17/21 1453       Activity Barriers & Cardiac Risk Stratification   Activity Barriers Back Problems;Joint Problems;Deconditioning;Muscular Weakness             6 Minute Walk:  6 Minute Walk     Row Name 02/17/21 1452         6 Minute Walk   Phase Initial     Distance 1545 feet     Walk Time 6 minutes     # of Rest Breaks 0     MPH 2.92     METS 2.9     RPE 12     Perceived Dyspnea  0     VO2 Peak 10.15     Symptoms Yes (comment)     Comments Bilateral hip pain 2/10     Resting HR 72 bpm     Resting BP 110/64     Resting Oxygen Saturation  97 %     Exercise Oxygen Saturation  during 6 min walk 95 %     Max Ex. HR 109 bpm     Max Ex. BP 114/58     2 Minute Post BP 106/64              Oxygen Initial Assessment:   Oxygen Re-Evaluation:   Oxygen Discharge (Final Oxygen Re-Evaluation):   Initial Exercise Prescription:  Initial Exercise  Prescription - 02/17/21 1500       Date of Initial Exercise RX and Referring Provider   Date 02/17/21    Referring Provider End, [name]  MD      Oxygen   Maintain Oxygen Saturation 88% or higher      NuStep   Level 3    SPM 80    Minutes 15    METs 2.9      T5 Nustep   Level 2    SPM 80    Minutes 15    METs 2.9      Track   Laps 35    Minutes 15    METs 2.9      Prescription Details   Frequency (times per week) 2    Duration  Progress to 30 minutes of continuous aerobic without signs/symptoms of physical distress      Intensity   THRR 40-80% of Max Heartrate 100-129    Ratings of Perceived Exertion 11-13    Perceived Dyspnea 0-4      Progression   Progression Continue to progress workloads to maintain intensity without signs/symptoms of physical distress.      Resistance Training   Training Prescription Yes    Weight 3 lb    Reps 10-15             Perform Capillary Blood Glucose checks as needed.  Exercise Prescription Changes:   Exercise Prescription Changes     Row Name 02/17/21 1500             Response to Exercise   Blood Pressure (Admit) 110/64       Blood Pressure (Exercise) 114/58       Blood Pressure (Exit) 106/64       Heart Rate (Admit) 72 bpm       Heart Rate (Exercise) 109 bpm       Heart Rate (Exit) 77 bpm       Oxygen Saturation (Admit) 97 %       Oxygen Saturation (Exercise) 95 %       Oxygen Saturation (Exit) 97 %       Rating of Perceived Exertion (Exercise) 12       Perceived Dyspnea (Exercise) 0       Symptoms Bilateral hip pain 2/10       Comments walk test results                Exercise Comments:   Exercise Goals and Review:   Exercise Goals     Row Name 02/17/21 1505             Exercise Goals   Increase Physical Activity Yes       Intervention Provide advice, education, support and counseling about physical activity/exercise needs.;Develop an individualized exercise prescription for aerobic  and resistive training based on initial evaluation findings, risk stratification, comorbidities and participant's personal goals.       Expected Outcomes Short Term: Attend rehab on a regular basis to increase amount of physical activity.;Long Term: Add in home exercise to make exercise part of routine and to increase amount of physical activity.;Long Term: Exercising regularly at least 3-5 days a week.       Increase Strength and Stamina Yes       Intervention Provide advice, education, support and counseling about physical activity/exercise needs.;Develop an individualized exercise prescription for aerobic and resistive training based on initial evaluation findings, risk stratification, comorbidities and participant's personal goals.       Expected Outcomes Short Term: Increase workloads from initial exercise prescription for resistance, speed, and METs.;Short Term: Perform resistance training exercises routinely during rehab and add in resistance training at home;Long Term: Improve cardiorespiratory fitness, muscular endurance and strength as measured by increased METs and functional capacity (6MWT)       Able to understand and use rate of perceived exertion (RPE) scale Yes       Intervention Provide education and explanation on how to use RPE scale       Expected Outcomes Short Term: Able to use RPE daily in rehab to express subjective intensity level;Long Term:  Able to use RPE to guide intensity level when exercising independently       Able to understand and use Dyspnea scale Yes  Intervention Provide education and explanation on how to use Dyspnea scale       Expected Outcomes Short Term: Able to use Dyspnea scale daily in rehab to express subjective sense of shortness of breath during exertion;Long Term: Able to use Dyspnea scale to guide intensity level when exercising independently       Knowledge and understanding of Target Heart Rate Range (THRR) Yes       Intervention Provide education  and explanation of THRR including how the numbers were predicted and where they are located for reference       Expected Outcomes Short Term: Able to state/look up THRR;Long Term: Able to use THRR to govern intensity when exercising independently;Short Term: Able to use daily as guideline for intensity in rehab       Able to check pulse independently Yes       Intervention Provide education and demonstration on how to check pulse in carotid and radial arteries.;Review the importance of being able to check your own pulse for safety during independent exercise       Expected Outcomes Short Term: Able to explain why pulse checking is important during independent exercise;Long Term: Able to check pulse independently and accurately       Understanding of Exercise Prescription Yes       Intervention Provide education, explanation, and written materials on patient's individual exercise prescription       Expected Outcomes Short Term: Able to explain program exercise prescription;Long Term: Able to explain home exercise prescription to exercise independently                Exercise Goals Re-Evaluation :   Discharge Exercise Prescription (Final Exercise Prescription Changes):  Exercise Prescription Changes - 02/17/21 1500       Response to Exercise   Blood Pressure (Admit) 110/64    Blood Pressure (Exercise) 114/58    Blood Pressure (Exit) 106/64    Heart Rate (Admit) 72 bpm    Heart Rate (Exercise) 109 bpm    Heart Rate (Exit) 77 bpm    Oxygen Saturation (Admit) 97 %    Oxygen Saturation (Exercise) 95 %    Oxygen Saturation (Exit) 97 %    Rating of Perceived Exertion (Exercise) 12    Perceived Dyspnea (Exercise) 0    Symptoms Bilateral hip pain 2/10    Comments walk test results             Nutrition:  Target Goals: Understanding of nutrition guidelines, daily intake of sodium 1500mg , cholesterol 200mg , calories 30% from fat and 7% or less from saturated fats, daily to have 5 or  more servings of fruits and vegetables.  Education: All About Nutrition: -Group instruction provided by verbal, written material, interactive activities, discussions, models, and posters to present general guidelines for heart healthy nutrition including fat, fiber, MyPlate, the role of sodium in heart healthy nutrition, utilization of the nutrition label, and utilization of this knowledge for meal planning. Follow up email sent as well. Written material given at graduation. Flowsheet Row Cardiac Rehab from 02/17/2021 in [facility] Cardiac and Pulmonary Rehab  Education need identified 02/17/21       Biometrics:  Pre Biometrics - 02/17/21 0924       Pre Biometrics   Height 5\' 11"  (1.803 m)    Weight 209 lb 4.8 oz (94.9 kg)    BMI (Calculated) 29.2    Single Leg Stand 2.7 seconds  Nutrition Therapy Plan and Nutrition Goals:  Nutrition Therapy & Goals - 02/17/21 1508       Intervention Plan   Intervention Prescribe, educate and counsel regarding individualized specific dietary modifications aiming towards targeted core components such as weight, hypertension, lipid management, diabetes, heart failure and other comorbidities.    Expected Outcomes Short Term Goal: Understand basic principles of dietary content, such as calories, fat, sodium, cholesterol and nutrients.;Short Term Goal: A plan has been developed with personal nutrition goals set during dietitian appointment.;Long Term Goal: Adherence to prescribed nutrition plan.             Nutrition Assessments:  MEDIFICTS Score Key: ?70 Need to make dietary changes  40-70 Heart Healthy Diet ? 40 Therapeutic Level Cholesterol Diet  Flowsheet Row Cardiac Rehab from 02/17/2021 in [facility] Cardiac and Pulmonary Rehab  Picture Your Plate Total Score on Admission 69      Picture Your Plate Scores: <24 Unhealthy dietary pattern with much room for improvement. 41-50 Dietary pattern unlikely to meet recommendations for good  health and room for improvement. 51-60 More healthful dietary pattern, with some room for improvement.  >60 Healthy dietary pattern, although there may be some specific behaviors that could be improved.    Nutrition Goals Re-Evaluation:   Nutrition Goals Discharge (Final Nutrition Goals Re-Evaluation):   Psychosocial: Target Goals: Acknowledge presence or absence of significant depression and/or stress, maximize coping skills, provide positive support system. Participant is able to verbalize types and ability to use techniques and skills needed for reducing stress and depression.   Education: Stress, Anxiety, and Depression - Group verbal and visual presentation to define topics covered.  Reviews how body is impacted by stress, anxiety, and depression.  Also discusses healthy ways to reduce stress and to treat/manage anxiety and depression.  Written material given at graduation.   Education: Sleep Hygiene -Provides group verbal and written instruction about how sleep can affect your health.  Define sleep hygiene, discuss sleep cycles and impact of sleep habits. Review good sleep hygiene tips.    Initial Review & Psychosocial Screening:  Initial Psych Review & Screening - 02/11/21 1006       Initial Review   Current issues with Current Sleep Concerns;Current Anxiety/Panic      Family Dynamics   Good Support System? Yes   wife     Barriers   Psychosocial barriers to participate in program There are no identifiable barriers or psychosocial needs.;The patient should benefit from training in stress management and relaxation.      Screening Interventions   Interventions Encouraged to exercise;Provide feedback about the scores to participant;To provide support and resources with identified psychosocial needs    Expected Outcomes Short Term goal: Utilizing psychosocial counselor, staff and physician to assist with identification of specific Stressors or current issues interfering with  healing process. Setting desired goal for each stressor or current issue identified.;Long Term Goal: Stressors or current issues are controlled or eliminated.;Short Term goal: Identification and review with participant of any Quality of Life or Depression concerns found by scoring the questionnaire.;Long Term goal: The participant improves quality of Life and PHQ9 Scores as seen by post scores and/or verbalization of changes             Quality of Life Scores:   Quality of Life - 02/17/21 1416       Quality of Life   Select Quality of Life      Quality of Life Scores   Health/Function Pre 26 %  Socioeconomic Pre 23.25 %    Psych/Spiritual Pre 24.93 %    Family Pre 28.8 %    GLOBAL Pre 25.56 %            Scores of 19 and below usually indicate a poorer quality of life in these areas.  A difference of  2-3 points is a clinically meaningful difference.  A difference of 2-3 points in the total score of the Quality of Life Index has been associated with significant improvement in overall quality of life, self-image, physical symptoms, and general health in studies assessing change in quality of life.  PHQ-9: Recent Review Flowsheet Data     Depression screen [facility] 2/9 02/17/2021   Decreased Interest 0   Down, Depressed, Hopeless 0   PHQ - 2 Score 0   Altered sleeping 1   Tired, decreased energy 2   Change in appetite 0   Feeling bad or failure about yourself  0   Trouble concentrating 0   Moving slowly or fidgety/restless 0   Suicidal thoughts 0   PHQ-9 Score 3   Difficult doing work/chores Not difficult at all      Interpretation of Total Score  Total Score Depression Severity:  1-4 = Minimal depression, 5-9 = Mild depression, 10-14 = Moderate depression, 15-19 = Moderately severe depression, 20-27 = Severe depression   Psychosocial Evaluation and Intervention:  Psychosocial Evaluation - 02/11/21 1031       Psychosocial Evaluation & Interventions   Interventions  Encouraged to exercise with the program and follow exercise prescription;Stress management education;Relaxation education    Comments Patrick Jupiter is feeling much better after his stent. He can tell a difference in his stamina as well. He does have some hip pain that hinders him from walking as much as he wishes, but he is ready to get started in the program to help gain some strength and lose weight. He does state he has anxiety daily but has had it for a long time so he feels like he manages it well. It can present itself in arriving 30 minutes early to an appt in fear of being late, for example. His wife his is main support system and helps him manage his medications. His sleep is distrubed by having to get up and use the bathroom frequently, sometimes its easy to fall back asleep other times it is not. He is hopeful this program will help with his understanding of how to better care for himself.    Expected Outcomes Short: attend cardiac rehab for education and exercise. Long: develop and maintain positive self care habits.    Continue Psychosocial Services  Follow up required by staff             Psychosocial Re-Evaluation:   Psychosocial Discharge (Final Psychosocial Re-Evaluation):   Vocational Rehabilitation: Provide vocational rehab assistance to qualifying candidates.   Vocational Rehab Evaluation & Intervention:  Vocational Rehab - 02/11/21 1031       Initial Vocational Rehab Evaluation & Intervention   Assessment shows need for Vocational Rehabilitation No             Education: Education Goals: Education classes will be provided on a variety of topics geared toward better understanding of heart health and risk factor modification. Participant will state understanding/return demonstration of topics presented as noted by education test scores.  Learning Barriers/Preferences:  Learning Barriers/Preferences - 02/11/21 1031       Learning Barriers/Preferences   Learning  Barriers None    Learning  Preferences None             General Cardiac Education Topics:  AED/CPR: - Group verbal and written instruction with the use of models to demonstrate the basic use of the AED with the basic ABC's of resuscitation.   Anatomy and Cardiac Procedures: - Group verbal and visual presentation and models provide information about basic cardiac anatomy and function. Reviews the testing methods done to diagnose heart disease and the outcomes of the test results. Describes the treatment choices: Medical Management, Angioplasty, or Coronary Bypass Surgery for treating various heart conditions including Myocardial Infarction, Angina, Valve Disease, and Cardiac Arrhythmias.  Written material given at graduation.   Medication Safety: - Group verbal and visual instruction to review commonly prescribed medications for heart and lung disease. Reviews the medication, class of the drug, and side effects. Includes the steps to properly store meds and maintain the prescription regimen.  Written material given at graduation.   Intimacy: - Group verbal instruction through game format to discuss how heart and lung disease can affect sexual intimacy. Written material given at graduation..   Know Your Numbers and Heart Failure: - Group verbal and visual instruction to discuss disease risk factors for cardiac and pulmonary disease and treatment options.  Reviews associated critical values for Overweight/Obesity, Hypertension, Cholesterol, and Diabetes.  Discusses basics of heart failure: signs/symptoms and treatments.  Introduces Heart Failure Zone chart for action plan for heart failure.  Written material given at graduation.   Infection Prevention: - Provides verbal and written material to individual with discussion of infection control including proper hand washing and proper equipment cleaning during exercise session. Flowsheet Row Cardiac Rehab from 02/17/2021 in [facility] Cardiac and  Pulmonary Rehab  Education need identified 02/17/21  Date 02/17/21  Educator [place]  Instruction Review Code 1- Verbalizes Understanding       Falls Prevention: - Provides verbal and written material to individual with discussion of falls prevention and safety. Flowsheet Row Cardiac Rehab from 02/17/2021 in [facility] Cardiac and Pulmonary Rehab  Education need identified 02/17/21  Date 02/17/21  Educator [place]  Instruction Review Code 1- Verbalizes Understanding       Other: -Provides group and verbal instruction on various topics (see comments)   Knowledge Questionnaire Score:  Knowledge Questionnaire Score - 02/17/21 1416       Knowledge Questionnaire Score   Pre Score 23/26: Angina, Exercise, Nutrition             Core Components/Risk Factors/Patient Goals at Admission:  Personal Goals and Risk Factors at Admission - 02/17/21 1505       Core Components/Risk Factors/Patient Goals on Admission    Weight Management Yes;Weight Loss    Intervention Weight Management: Provide education and appropriate resources to help participant work on and attain dietary goals.;Weight Management: Develop a combined nutrition and exercise program designed to reach desired caloric intake, while maintaining appropriate intake of nutrient and fiber, sodium and fats, and appropriate energy expenditure required for the weight goal.;Weight Management/Obesity: Establish reasonable short term and long term weight goals.    Admit Weight 209 lb (94.8 kg)    Goal Weight: Short Term 205 lb (93 kg)    Goal Weight: Long Term 199 lb (90.3 kg)    Expected Outcomes Short Term: Continue to assess and modify interventions until short term weight is achieved;Long Term: Adherence to nutrition and physical activity/exercise program aimed toward attainment of established weight goal;Weight Loss: Understanding of general recommendations for a balanced deficit meal plan, which  promotes 1-2 lb weight loss per week and  includes a negative energy balance of [phone] kcal/d;Understanding recommendations for meals to include 15-35% energy as protein, 25-35% energy from fat, 35-60% energy from carbohydrates, less than 200mg  of dietary cholesterol, 20-35 gm of total fiber daily;Understanding of distribution of calorie intake throughout the day with the consumption of 4-5 meals/snacks    Hypertension Yes    Intervention Provide education on lifestyle modifcations including regular physical activity/exercise, weight management, moderate sodium restriction and increased consumption of fresh fruit, vegetables, and low fat dairy, alcohol moderation, and smoking cessation.;Monitor prescription use compliance.    Expected Outcomes Short Term: Continued assessment and intervention until BP is < 140/54mm HG in hypertensive participants. < 130/43mm HG in hypertensive participants with diabetes, heart failure or chronic kidney disease.;Long Term: Maintenance of blood pressure at goal levels.    Lipids Yes    Intervention Provide education and support for participant on nutrition & aerobic/resistive exercise along with prescribed medications to achieve LDL 70mg , HDL >40mg .    Expected Outcomes Short Term: Participant states understanding of desired cholesterol values and is compliant with medications prescribed. Participant is following exercise prescription and nutrition guidelines.;Long Term: Cholesterol controlled with medications as prescribed, with individualized exercise RX and with personalized nutrition plan. Value goals: LDL < 70mg , HDL > 40 mg.             Education:Diabetes - Individual verbal and written instruction to review signs/symptoms of diabetes, desired ranges of glucose level fasting, after meals and with exercise. Acknowledge that pre and post exercise glucose checks will be done for 3 sessions at entry of program.   Core Components/Risk Factors/Patient Goals Review:    Core Components/Risk  Factors/Patient Goals at Discharge (Final Review):    ITP Comments:  ITP Comments     Row Name 02/11/21 1003 02/17/21 0917         ITP Comments Inital telephone orientation completed. Diagnosis can be found in [facility] 8/23. EP orientation scheduled for Thursday 10/6 at 8am. Completed 6MWT and gym orientation. Initial ITP created and sent for review to Dr. Emily Filbert, Medical Director.               Comments: Initial ITP

## 2021-02-17 NOTE — Patient Instructions (Signed)
Patient Instructions  Patient Details  Name: Steve Warren MRN: [id number] Date of Birth: 1945-06-24 Referring Provider:  Nelva Bush, MD  Below are your personal goals for exercise, nutrition, and risk factors. Our goal is to help you stay on track towards obtaining and maintaining these goals. We will be discussing your progress on these goals with you throughout the program.  Initial Exercise Prescription:  Initial Exercise Prescription - 02/17/21 1500       Date of Initial Exercise RX and Referring Provider   Date 02/17/21    Referring Provider End, [name]  MD      Oxygen   Maintain Oxygen Saturation 88% or higher      NuStep   Level 3    SPM 80    Minutes 15    METs 2.9      T5 Nustep   Level 2    SPM 80    Minutes 15    METs 2.9      Track   Laps 35    Minutes 15    METs 2.9      Prescription Details   Frequency (times per week) 2    Duration Progress to 30 minutes of continuous aerobic without signs/symptoms of physical distress      Intensity   THRR 40-80% of Max Heartrate 100-129    Ratings of Perceived Exertion 11-13    Perceived Dyspnea 0-4      Progression   Progression Continue to progress workloads to maintain intensity without signs/symptoms of physical distress.      Resistance Training   Training Prescription Yes    Weight 3 lb    Reps 10-15             Exercise Goals: Frequency: Be able to perform aerobic exercise two to three times per week in program working toward 2-5 days per week of home exercise.  Intensity: Work with a perceived exertion of 11 (fairly light) - 15 (hard) while following your exercise prescription.  We will make changes to your prescription with you as you progress through the program.   Duration: Be able to do 30 to 45 minutes of continuous aerobic exercise in addition to a 5 minute warm-up and a 5 minute cool-down routine.   Nutrition Goals: Your personal nutrition goals will be established when  you do your nutrition analysis with the dietician.  The following are general nutrition guidelines to follow: Cholesterol < 200mg /day Sodium < 1500mg /day Fiber: Men over 50 yrs - 30 grams per day  Personal Goals:  Personal Goals and Risk Factors at Admission - 02/17/21 1505       Core Components/Risk Factors/Patient Goals on Admission    Weight Management Yes;Weight Loss    Intervention Weight Management: Provide education and appropriate resources to help participant work on and attain dietary goals.;Weight Management: Develop a combined nutrition and exercise program designed to reach desired caloric intake, while maintaining appropriate intake of nutrient and fiber, sodium and fats, and appropriate energy expenditure required for the weight goal.;Weight Management/Obesity: Establish reasonable short term and long term weight goals.    Admit Weight 209 lb (94.8 kg)    Goal Weight: Short Term 205 lb (93 kg)    Goal Weight: Long Term 199 lb (90.3 kg)    Expected Outcomes Short Term: Continue to assess and modify interventions until short term weight is achieved;Long Term: Adherence to nutrition and physical activity/exercise program aimed toward attainment of established weight goal;Weight Loss: Understanding of  general recommendations for a balanced deficit meal plan, which promotes 1-2 lb weight loss per week and includes a negative energy balance of [phone] kcal/d;Understanding recommendations for meals to include 15-35% energy as protein, 25-35% energy from fat, 35-60% energy from carbohydrates, less than 200mg  of dietary cholesterol, 20-35 gm of total fiber daily;Understanding of distribution of calorie intake throughout the day with the consumption of 4-5 meals/snacks    Hypertension Yes    Intervention Provide education on lifestyle modifcations including regular physical activity/exercise, weight management, moderate sodium restriction and increased consumption of fresh fruit, vegetables,  and low fat dairy, alcohol moderation, and smoking cessation.;Monitor prescription use compliance.    Expected Outcomes Short Term: Continued assessment and intervention until BP is < 140/39mm HG in hypertensive participants. < 130/73mm HG in hypertensive participants with diabetes, heart failure or chronic kidney disease.;Long Term: Maintenance of blood pressure at goal levels.    Lipids Yes    Intervention Provide education and support for participant on nutrition & aerobic/resistive exercise along with prescribed medications to achieve LDL 70mg , HDL >40mg .    Expected Outcomes Short Term: Participant states understanding of desired cholesterol values and is compliant with medications prescribed. Participant is following exercise prescription and nutrition guidelines.;Long Term: Cholesterol controlled with medications as prescribed, with individualized exercise RX and with personalized nutrition plan. Value goals: LDL < 70mg , HDL > 40 mg.             Tobacco Use Initial Evaluation: Social History   Tobacco Use  Smoking Status Former   Types: Cigarettes   Quit date: 2012   Years since quitting: 10.7  Smokeless Tobacco Never    Exercise Goals and Review:  Exercise Goals     Row Name 02/17/21 1505             Exercise Goals   Increase Physical Activity Yes       Intervention Provide advice, education, support and counseling about physical activity/exercise needs.;Develop an individualized exercise prescription for aerobic and resistive training based on initial evaluation findings, risk stratification, comorbidities and participant's personal goals.       Expected Outcomes Short Term: Attend rehab on a regular basis to increase amount of physical activity.;Long Term: Add in home exercise to make exercise part of routine and to increase amount of physical activity.;Long Term: Exercising regularly at least 3-5 days a week.       Increase Strength and Stamina Yes       Intervention  Provide advice, education, support and counseling about physical activity/exercise needs.;Develop an individualized exercise prescription for aerobic and resistive training based on initial evaluation findings, risk stratification, comorbidities and participant's personal goals.       Expected Outcomes Short Term: Increase workloads from initial exercise prescription for resistance, speed, and METs.;Short Term: Perform resistance training exercises routinely during rehab and add in resistance training at home;Long Term: Improve cardiorespiratory fitness, muscular endurance and strength as measured by increased METs and functional capacity (6MWT)       Able to understand and use rate of perceived exertion (RPE) scale Yes       Intervention Provide education and explanation on how to use RPE scale       Expected Outcomes Short Term: Able to use RPE daily in rehab to express subjective intensity level;Long Term:  Able to use RPE to guide intensity level when exercising independently       Able to understand and use Dyspnea scale Yes       Intervention  Provide education and explanation on how to use Dyspnea scale       Expected Outcomes Short Term: Able to use Dyspnea scale daily in rehab to express subjective sense of shortness of breath during exertion;Long Term: Able to use Dyspnea scale to guide intensity level when exercising independently       Knowledge and understanding of Target Heart Rate Range (THRR) Yes       Intervention Provide education and explanation of THRR including how the numbers were predicted and where they are located for reference       Expected Outcomes Short Term: Able to state/look up THRR;Long Term: Able to use THRR to govern intensity when exercising independently;Short Term: Able to use daily as guideline for intensity in rehab       Able to check pulse independently Yes       Intervention Provide education and demonstration on how to check pulse in carotid and radial  arteries.;Review the importance of being able to check your own pulse for safety during independent exercise       Expected Outcomes Short Term: Able to explain why pulse checking is important during independent exercise;Long Term: Able to check pulse independently and accurately       Understanding of Exercise Prescription Yes       Intervention Provide education, explanation, and written materials on patient's individual exercise prescription       Expected Outcomes Short Term: Able to explain program exercise prescription;Long Term: Able to explain home exercise prescription to exercise independently                Copy of goals given to participant.

## 2021-02-22 ENCOUNTER — Other Ambulatory Visit: Payer: Self-pay

## 2021-02-22 DIAGNOSIS — Z955 Presence of coronary angioplasty implant and graft: Secondary | ICD-10-CM | POA: Diagnosis not present

## 2021-02-22 NOTE — Progress Notes (Signed)
Daily Session Note  Patient Details  Name: Steve Warren MRN: [id number] Date of Birth: [birth year] Referring Provider:   Flowsheet Row Cardiac Rehab from 02/17/2021 in [facility] Cardiac and Pulmonary Rehab  Referring Provider End, [name]  MD       Encounter Date: 02/22/2021  Check In:  Session Check In - 02/22/21 0918       Check-In   Supervising physician immediately available to respond to emergencies See telemetry face sheet for immediately available ER MD    Location [facility]-Cardiac & Pulmonary Rehab    Staff Present Birdie Sons, MPA, RN;Jessica Luan Pulling, MA, RCEP, CCRP, CCET;Amanda Sommer, BA, ACSM CEP, Exercise Physiologist    Virtual Visit No    Medication changes reported     No    Fall or balance concerns reported    No    Warm-up and Cool-down Performed on first and last piece of equipment    Resistance Training Performed Yes    VAD Patient? No    PAD/SET Patient? No      Pain Assessment   Currently in Pain? No/denies                Social History   Tobacco Use  Smoking Status Former   Types: Cigarettes   Quit date: 2012   Years since quitting: 10.7  Smokeless Tobacco Never    Goals Met:  Independence with exercise equipment Exercise tolerated well No report of concerns or symptoms today Strength training completed today  Goals Unmet:  Not Applicable  Comments: First full day of exercise!  Patient was oriented to gym and equipment including functions, settings, policies, and procedures.  Patient's individual exercise prescription and treatment plan were reviewed.  All starting workloads were established based on the results of the 6 minute walk test done at initial orientation visit.  The plan for exercise progression was also introduced and progression will be customized based on patient's performance and goals.    Dr. Emily Filbert is Medical Director for [place].  Dr. Ottie Glazier is Medical Director for  [facility] Pulmonary Rehabilitation.

## 2021-02-24 ENCOUNTER — Other Ambulatory Visit: Payer: Self-pay

## 2021-02-24 DIAGNOSIS — Z955 Presence of coronary angioplasty implant and graft: Secondary | ICD-10-CM | POA: Diagnosis not present

## 2021-02-24 NOTE — Progress Notes (Signed)
Daily Session Note  Patient Details  Name: Steve Warren MRN: [id number] Date of Birth: 10/21/1945 Referring Provider:   Flowsheet Row Cardiac Rehab from 02/17/2021 in [facility] Cardiac and Pulmonary Rehab  Referring Provider End, [name]  MD       Encounter Date: 02/24/2021  Check In:  Session Check In - 02/24/21 0931       Check-In   Supervising physician immediately available to respond to emergencies See telemetry face sheet for immediately available ER MD    Location [facility]-Cardiac & Pulmonary Rehab    Staff Present Birdie Sons, MPA, RN;Amanda Oletta Darter, BA, ACSM CEP, Exercise Physiologist;Melissa Caiola, RDN, LDN    Virtual Visit No    Medication changes reported     No    Fall or balance concerns reported    No    Warm-up and Cool-down Performed on first and last piece of equipment    Resistance Training Performed Yes    VAD Patient? No    PAD/SET Patient? No      Pain Assessment   Currently in Pain? No/denies                Social History   Tobacco Use  Smoking Status Former   Types: Cigarettes   Quit date: 2012   Years since quitting: 10.7  Smokeless Tobacco Never    Goals Met:  Independence with exercise equipment Exercise tolerated well No report of concerns or symptoms today Strength training completed today  Goals Unmet:  Not Applicable  Comments: Pt able to follow exercise prescription today without complaint.  Will continue to monitor for progression.    Dr. Emily Filbert is Medical Director for [place].  Dr. Ottie Glazier is Medical Director for [facility] Pulmonary Rehabilitation.

## 2021-03-03 ENCOUNTER — Other Ambulatory Visit: Payer: Self-pay

## 2021-03-03 DIAGNOSIS — Z955 Presence of coronary angioplasty implant and graft: Secondary | ICD-10-CM

## 2021-03-03 NOTE — Progress Notes (Signed)
Daily Session Note  Patient Details  Name: Steve Warren MRN: [id number] Date of Birth: [birth year] Referring Provider:   Flowsheet Row Cardiac Rehab from 02/17/2021 in [facility] Cardiac and Pulmonary Rehab  Referring Provider End, [name]  MD       Encounter Date: 03/03/2021  Check In:  Session Check In - 03/03/21 1001       Check-In   Supervising physician immediately available to respond to emergencies See telemetry face sheet for immediately available ER MD    Location [facility]-Cardiac & Pulmonary Rehab    Staff Present Birdie Sons, MPA, RN;Jessica [place], MA, RCEP, CCRP, Rosalio Macadamia, BS, ACSM CEP, Exercise Physiologist;Amanda Oletta Darter, [place], ACSM CEP, Exercise Physiologist    Virtual Visit No    Medication changes reported     Yes    Comments added antibiotic eye ointment until 11/4    Fall or balance concerns reported    No    Warm-up and Cool-down Performed on first and last piece of equipment    Resistance Training Performed Yes    VAD Patient? No    PAD/SET Patient? No      Pain Assessment   Currently in Pain? No/denies                Social History   Tobacco Use  Smoking Status Former   Types: Cigarettes   Quit date: 2012   Years since quitting: 10.8  Smokeless Tobacco Never    Goals Met:  Independence with exercise equipment Exercise tolerated well No report of concerns or symptoms today Strength training completed today  Goals Unmet:  Not Applicable  Comments: Pt able to follow exercise prescription today without complaint.  Will continue to monitor for progression.    Dr. Emily Filbert is Medical Director for [place].  Dr. Ottie Glazier is Medical Director for [facility] Pulmonary Rehabilitation.

## 2021-03-08 ENCOUNTER — Other Ambulatory Visit: Payer: Self-pay

## 2021-03-08 DIAGNOSIS — Z955 Presence of coronary angioplasty implant and graft: Secondary | ICD-10-CM | POA: Diagnosis not present

## 2021-03-08 NOTE — Progress Notes (Signed)
Daily Session Note  Patient Details  Name: AVEREY TROMPETER MRN: [id number] Date of Birth: [birth year] Referring Provider:   Flowsheet Row Cardiac Rehab from 02/17/2021 in [facility] Cardiac and Pulmonary Rehab  Referring Provider End, [name]  MD       Encounter Date: 03/08/2021  Check In:  Session Check In - 03/08/21 0949       Check-In   Supervising physician immediately available to respond to emergencies See telemetry face sheet for immediately available ER MD    Location [facility]-Cardiac & Pulmonary Rehab    Staff Present Birdie Sons, MPA, RN;Melissa [place], RDN, Rowe Pavy, BA, ACSM CEP, Exercise Physiologist    Virtual Visit No    Medication changes reported     No    Fall or balance concerns reported    No    Warm-up and Cool-down Performed on first and last piece of equipment    Resistance Training Performed Yes    VAD Patient? No    PAD/SET Patient? No      Pain Assessment   Currently in Pain? No/denies                Social History   Tobacco Use  Smoking Status Former   Types: Cigarettes   Quit date: 2012   Years since quitting: 10.8  Smokeless Tobacco Never    Goals Met:  Independence with exercise equipment Exercise tolerated well No report of concerns or symptoms today Strength training completed today  Goals Unmet:  Not Applicable  Comments: Pt able to follow exercise prescription today without complaint.  Will continue to monitor for progression.    Dr. Emily Filbert is Medical Director for [place].  Dr. Ottie Glazier is Medical Director for [facility] Pulmonary Rehabilitation.

## 2021-03-10 ENCOUNTER — Other Ambulatory Visit: Payer: Self-pay

## 2021-03-10 DIAGNOSIS — Z955 Presence of coronary angioplasty implant and graft: Secondary | ICD-10-CM

## 2021-03-10 NOTE — Progress Notes (Signed)
Daily Session Note  Patient Details  Name: Steve Warren MRN: [id number] Date of Birth: 1946-03-01 Referring Provider:   Flowsheet Row Cardiac Rehab from 02/17/2021 in [facility] Cardiac and Pulmonary Rehab  Referring Provider End, [name]  MD       Encounter Date: 03/10/2021  Check In:  Session Check In - 03/10/21 0932       Check-In   Supervising physician immediately available to respond to emergencies See telemetry face sheet for immediately available ER MD    Location [facility]-Cardiac & Pulmonary Rehab    Staff Present Birdie Sons, MPA, Mauricia [place], BS, ACSM CEP, Exercise Physiologist;Joseph Tessie Fass, [place]    Virtual Visit No    Medication changes reported     No    Fall or balance concerns reported    No    Warm-up and Cool-down Performed on first and last piece of equipment    Resistance Training Performed Yes    VAD Patient? No    PAD/SET Patient? No      Pain Assessment   Currently in Pain? No/denies                Social History   Tobacco Use  Smoking Status Former   Types: Cigarettes   Quit date: 2012   Years since quitting: 10.8  Smokeless Tobacco Never    Goals Met:  Independence with exercise equipment Exercise tolerated well No report of concerns or symptoms today Strength training completed today  Goals Unmet:  Not Applicable  Comments: Pt able to follow exercise prescription today without complaint.  Will continue to monitor for progression.    Dr. Emily Filbert is Medical Director for [place].  Dr. Ottie Glazier is Medical Director for [facility] Pulmonary Rehabilitation.

## 2021-03-15 ENCOUNTER — Encounter: Payer: Medicare PPO | Attending: Internal Medicine

## 2021-03-15 ENCOUNTER — Other Ambulatory Visit: Payer: Self-pay

## 2021-03-15 DIAGNOSIS — Z955 Presence of coronary angioplasty implant and graft: Secondary | ICD-10-CM | POA: Diagnosis not present

## 2021-03-15 NOTE — Progress Notes (Signed)
Daily Session Note  Patient Details  Name: Steve Warren MRN: [id number] Date of Birth: [birth year] Referring Provider:   Flowsheet Row Cardiac Rehab from 02/17/2021 in [facility] Cardiac and Pulmonary Rehab  Referring Provider End, [name]  MD       Encounter Date: 03/15/2021  Check In:  Session Check In - 03/15/21 0929       Check-In   Supervising physician immediately available to respond to emergencies See telemetry face sheet for immediately available ER MD    Location [facility]-Cardiac & Pulmonary Rehab    Staff Present Birdie Sons, MPA, RN;Jessica Luan Pulling, MA, RCEP, CCRP, CCET;Amanda Sommer, BA, ACSM CEP, Exercise Physiologist    Virtual Visit No    Medication changes reported     No    Fall or balance concerns reported    No    Warm-up and Cool-down Performed on first and last piece of equipment    Resistance Training Performed Yes    VAD Patient? No    PAD/SET Patient? No      Pain Assessment   Currently in Pain? No/denies                Social History   Tobacco Use  Smoking Status Former   Types: Cigarettes   Quit date: 2012   Years since quitting: 10.8  Smokeless Tobacco Never    Goals Met:  Independence with exercise equipment Exercise tolerated well No report of concerns or symptoms today Strength training completed today  Goals Unmet:  Not Applicable  Comments: Pt able to follow exercise prescription today without complaint.  Will continue to monitor for progression.    Dr. Emily Filbert is Medical Director for [place].  Dr. Ottie Glazier is Medical Director for [facility] Pulmonary Rehabilitation.

## 2021-03-16 ENCOUNTER — Encounter: Payer: Self-pay | Admitting: *Deleted

## 2021-03-16 DIAGNOSIS — Z955 Presence of coronary angioplasty implant and graft: Secondary | ICD-10-CM

## 2021-03-16 NOTE — Progress Notes (Signed)
Cardiac Individual Treatment Plan  Patient Details  Name: Steve Warren MRN: [id number] Date of Birth: May 26, 1945 Referring Provider:   Flowsheet Row Cardiac Rehab from 02/17/2021 in [facility] Cardiac and Pulmonary Rehab  Referring Provider End, [name]  MD       Initial Encounter Date:  Flowsheet Row Cardiac Rehab from 02/17/2021 in [facility] Cardiac and Pulmonary Rehab  Date 02/17/21       Visit Diagnosis: Status post coronary artery stent placement  Patient's Home Medications on Admission:  Current Outpatient Medications:    aspirin EC 81 MG EC tablet, Take 1 tablet (81 mg total) by mouth daily. Swallow whole., Disp: 30 tablet, Rfl: 0   atorvastatin (LIPITOR) 40 MG tablet, Take 1 tablet (40 mg total) by mouth daily., Disp: 90 tablet, Rfl: 3   Bacillus Coagulans-Inulin (PROBIOTIC) 1-250 BILLION-MG CAPS, Take 1 capsule by mouth daily., Disp: , Rfl:    clopidogrel (PLAVIX) 75 MG tablet, Take 1 tablet (75 mg total) by mouth daily., Disp: 30 tablet, Rfl: 11   Cyanocobalamin (VITAMIN B 12 PO), Take 1,000 mcg by mouth daily at 12 noon., Disp: , Rfl:    metoprolol succinate (TOPROL XL) 25 MG 24 hr tablet, Take 0.5 tablets (12.5 mg total) by mouth daily., Disp: 45 tablet, Rfl: 1   primidone (MYSOLINE) 50 MG tablet, Take 50 mg by mouth at bedtime., Disp: , Rfl:    Propylene Glycol (SYSTANE COMPLETE OP), Place 1 drop into both eyes in the morning, at noon, and at bedtime., Disp: , Rfl:    tamsulosin (FLOMAX) 0.4 MG CAPS capsule, Take 0.4 mg by mouth daily after supper., Disp: , Rfl:   Past Medical History: Past Medical History:  Diagnosis Date   Anxiety    BPH (benign prostatic hyperplasia)    CAD in native artery    Cataract    bilateral- Dec 2021 and jan 2022   GERD (gastroesophageal reflux disease)    Hypercholesterolemia    Hyperlipidemia    Hypertension     Tobacco Use: Social History   Tobacco Use  Smoking Status Former   Types: Cigarettes   Quit date: 2012   Years since  quitting: 10.8  Smokeless Tobacco Never    Labs: Recent Review Management consultant for [facility] Cardiac and Pulmonary Rehab Latest Ref Rng & Units 11/16/2020 01/27/2021   Cholestrol 0 - 200 mg/dL 128 116   LDLCALC 0 - 99 mg/dL 77 60   HDL >40 mg/dL 40(L) 36(L)   Trlycerides <150 mg/dL 55 99        Exercise Target Goals: Exercise Program Goal: Individual exercise prescription set using results from initial 6 min walk test and THRR while considering  patient's activity barriers and safety.   Exercise Prescription Goal: Initial exercise prescription builds to 30-45 minutes a day of aerobic activity, 2-3 days per week.  Home exercise guidelines will be given to patient during program as part of exercise prescription that the participant will acknowledge.   Education: Aerobic Exercise: - Group verbal and visual presentation on the components of exercise prescription. Introduces F.I.T.T principle from ACSM for exercise prescriptions.  Reviews F.I.T.T. principles of aerobic exercise including progression. Written material given at graduation. Flowsheet Row Cardiac Rehab from 03/10/2021 in [facility] Cardiac and Pulmonary Rehab  Education need identified 02/17/21  Date 03/10/21  Educator [employer]  Instruction Review Code 1- Verbalizes Understanding       Education: Resistance Exercise: - Group verbal and visual presentation on the components of  exercise prescription. Introduces F.I.T.T principle from ACSM for exercise prescriptions  Reviews F.I.T.T. principles of resistance exercise including progression. Written material given at graduation.    Education: Exercise & Equipment Safety: - Individual verbal instruction and demonstration of equipment use and safety with use of the equipment. Flowsheet Row Cardiac Rehab from 03/10/2021 in [facility] Cardiac and Pulmonary Rehab  Education need identified 02/17/21  Date 02/17/21  Educator [place]  Instruction Review Code 1- Verbalizes Understanding        Education: Exercise Physiology & General Exercise Guidelines: - Group verbal and written instruction with models to review the exercise physiology of the cardiovascular system and associated critical values. Provides general exercise guidelines with specific guidelines to those with heart or lung disease.  Flowsheet Row Cardiac Rehab from 03/10/2021 in [facility] Cardiac and Pulmonary Rehab  Education need identified 02/17/21  Date 03/03/21  Educator [facility]  Instruction Review Code 1- [employer] Understanding       Education: Flexibility, Balance, Mind/Body Relaxation: - Group verbal and visual presentation with interactive activity on the components of exercise prescription. Introduces F.I.T.T principle from ACSM for exercise prescriptions. Reviews F.I.T.T. principles of flexibility and balance exercise training including progression. Also discusses the mind body connection.  Reviews various relaxation techniques to help reduce and manage stress (i.e. Deep breathing, progressive muscle relaxation, and visualization). Balance handout provided to take home. Written material given at graduation.   Activity Barriers & Risk Stratification:  Activity Barriers & Cardiac Risk Stratification - 02/17/21 1453       Activity Barriers & Cardiac Risk Stratification   Activity Barriers Back Problems;Joint Problems;Deconditioning;Muscular Weakness             6 Minute Walk:  6 Minute Walk     Row Name 02/17/21 1452         6 Minute Walk   Phase Initial     Distance 1545 feet     Walk Time 6 minutes     # of Rest Breaks 0     MPH 2.92     METS 2.9     RPE 12     Perceived Dyspnea  0     VO2 Peak 10.15     Symptoms Yes (comment)     Comments Bilateral hip pain 2/10     Resting HR 72 bpm     Resting BP 110/64     Resting Oxygen Saturation  97 %     Exercise Oxygen Saturation  during 6 min walk 95 %     Max Ex. HR 109 bpm     Max Ex. BP 114/58     2 Minute Post BP 106/64               Oxygen Initial Assessment:   Oxygen Re-Evaluation:   Oxygen Discharge (Final Oxygen Re-Evaluation):   Initial Exercise Prescription:  Initial Exercise Prescription - 02/17/21 1500       Date of Initial Exercise RX and Referring Provider   Date 02/17/21    Referring Provider End, [name]  MD      Oxygen   Maintain Oxygen Saturation 88% or higher      NuStep   Level 3    SPM 80    Minutes 15    METs 2.9      T5 Nustep   Level 2    SPM 80    Minutes 15    METs 2.9      Track   Laps 35  Minutes 15    METs 2.9      Prescription Details   Frequency (times per week) 2    Duration Progress to 30 minutes of continuous aerobic without signs/symptoms of physical distress      Intensity   THRR 40-80% of Max Heartrate 100-129    Ratings of Perceived Exertion 11-13    Perceived Dyspnea 0-4      Progression   Progression Continue to progress workloads to maintain intensity without signs/symptoms of physical distress.      Resistance Training   Training Prescription Yes    Weight 3 lb    Reps 10-15             Perform Capillary Blood Glucose checks as needed.  Exercise Prescription Changes:   Exercise Prescription Changes     Row Name 02/17/21 1500 02/28/21 1000 03/14/21 1600         Response to Exercise   Blood Pressure (Admit) 110/64 132/76 130/70     Blood Pressure (Exercise) 114/58 136/64 130/70     Blood Pressure (Exit) 106/64 122/64 112/62     Heart Rate (Admit) 72 bpm 73 bpm 79 bpm     Heart Rate (Exercise) 109 bpm 114 bpm 119 bpm     Heart Rate (Exit) 77 bpm 100 bpm 100 bpm     Oxygen Saturation (Admit) 97 % -- --     Oxygen Saturation (Exercise) 95 % -- --     Oxygen Saturation (Exit) 97 % -- --     Rating of Perceived Exertion (Exercise) _0 Perceived Dyspnea (Exercise) 0 -- --     Symptoms Bilateral hip pain 2/10 none none     Comments walk test results 1st full day of exercise --     Duration -- Continue with 30 min  of aerobic exercise without signs/symptoms of physical distress. Continue with 30 min of aerobic exercise without signs/symptoms of physical distress.     Intensity -- THRR unchanged THRR unchanged       Progression   Progression -- Continue to progress workloads to maintain intensity without signs/symptoms of physical distress. Continue to progress workloads to maintain intensity without signs/symptoms of physical distress.     Average METs -- 2.51 3       Resistance Training   Training Prescription -- Yes Yes     Weight -- 3 lb 3 lb     Reps -- 10-15 10-15       Interval Training   Interval Training -- No No       NuStep   Level -- 3 --     Minutes -- 15 --     METs -- 2.3 --       T5 Nustep   Level -- 2 2     Minutes -- 15 15     METs -- 2.5 3.2       Track   Laps -- 30 35     Minutes -- 15 15     METs -- 2.63 2.9              Exercise Comments:   Exercise Comments     Row Name 02/22/21 8882           Exercise Comments First full day of exercise!  Patient was oriented to gym and equipment including functions, settings, policies, and procedures.  Patient's individual exercise prescription and treatment plan were reviewed.  All starting workloads  were established based on the results of the 6 minute walk test done at initial orientation visit.  The plan for exercise progression was also introduced and progression will be customized based on patient's performance and goals.                Exercise Goals and Review:   Exercise Goals     Row Name 02/17/21 1505             Exercise Goals   Increase Physical Activity Yes       Intervention Provide advice, education, support and counseling about physical activity/exercise needs.;Develop an individualized exercise prescription for aerobic and resistive training based on initial evaluation findings, risk stratification, comorbidities and participant's personal goals.       Expected Outcomes Short Term:  Attend rehab on a regular basis to increase amount of physical activity.;Long Term: Add in home exercise to make exercise part of routine and to increase amount of physical activity.;Long Term: Exercising regularly at least 3-5 days a week.       Increase Strength and Stamina Yes       Intervention Provide advice, education, support and counseling about physical activity/exercise needs.;Develop an individualized exercise prescription for aerobic and resistive training based on initial evaluation findings, risk stratification, comorbidities and participant's personal goals.       Expected Outcomes Short Term: Increase workloads from initial exercise prescription for resistance, speed, and METs.;Short Term: Perform resistance training exercises routinely during rehab and add in resistance training at home;Long Term: Improve cardiorespiratory fitness, muscular endurance and strength as measured by increased METs and functional capacity (6MWT)       Able to understand and use rate of perceived exertion (RPE) scale Yes       Intervention Provide education and explanation on how to use RPE scale       Expected Outcomes Short Term: Able to use RPE daily in rehab to express subjective intensity level;Long Term:  Able to use RPE to guide intensity level when exercising independently       Able to understand and use Dyspnea scale Yes       Intervention Provide education and explanation on how to use Dyspnea scale       Expected Outcomes Short Term: Able to use Dyspnea scale daily in rehab to express subjective sense of shortness of breath during exertion;Long Term: Able to use Dyspnea scale to guide intensity level when exercising independently       Knowledge and understanding of Target Heart Rate Range (THRR) Yes       Intervention Provide education and explanation of THRR including how the numbers were predicted and where they are located for reference       Expected Outcomes Short Term: Able to state/look up  THRR;Long Term: Able to use THRR to govern intensity when exercising independently;Short Term: Able to use daily as guideline for intensity in rehab       Able to check pulse independently Yes       Intervention Provide education and demonstration on how to check pulse in carotid and radial arteries.;Review the importance of being able to check your own pulse for safety during independent exercise       Expected Outcomes Short Term: Able to explain why pulse checking is important during independent exercise;Long Term: Able to check pulse independently and accurately       Understanding of Exercise Prescription Yes       Intervention Provide education, explanation, and written  materials on patient's individual exercise prescription       Expected Outcomes Short Term: Able to explain program exercise prescription;Long Term: Able to explain home exercise prescription to exercise independently                Exercise Goals Re-Evaluation :  Exercise Goals Re-Evaluation     Row Name 02/22/21 8016 02/28/21 1014 03/14/21 1615         Exercise Goal Re-Evaluation   Exercise Goals Review Increase Physical Activity;Able to understand and use rate of perceived exertion (RPE) scale;Knowledge and understanding of Target Heart Rate Range (THRR);Understanding of Exercise Prescription;Increase Strength and Stamina;Able to check pulse independently;Able to understand and use Dyspnea scale Increase Physical Activity;Increase Strength and Stamina Increase Physical Activity;Increase Strength and Stamina     Comments Reviewed RPE and dyspnea scales, THR and program prescription with pt today.  Pt voiced understanding and was given a copy of goals to take home. Steve Warren is doing well the first couple of sessions that he has been here for rehab. So far he is working within appropriate RPEs and will continue to monitor progression as he progresses throughout the program. Steve Warren is progressing well and has increased average  MET level and is up to 35 laps on the track.  We will continue to monitor progress.     Expected Outcomes Short: Use RPE daily to regulate intensity. Long: Follow program prescription in THR. Short: Continue exercise prescription Long: Work up to building overall MET level Short: attend consistently Long: continue to improve MET level              Discharge Exercise Prescription (Final Exercise Prescription Changes):  Exercise Prescription Changes - 03/14/21 1600       Response to Exercise   Blood Pressure (Admit) 130/70    Blood Pressure (Exercise) 130/70    Blood Pressure (Exit) 112/62    Heart Rate (Admit) 79 bpm    Heart Rate (Exercise) 119 bpm    Heart Rate (Exit) 100 bpm    Rating of Perceived Exertion (Exercise) 13    Symptoms none    Duration Continue with 30 min of aerobic exercise without signs/symptoms of physical distress.    Intensity THRR unchanged      Progression   Progression Continue to progress workloads to maintain intensity without signs/symptoms of physical distress.    Average METs 3      Resistance Training   Training Prescription Yes    Weight 3 lb    Reps 10-15      Interval Training   Interval Training No      T5 Nustep   Level 2    Minutes 15    METs 3.2      Track   Laps 35    Minutes 15    METs 2.9             Nutrition:  Target Goals: Understanding of nutrition guidelines, daily intake of sodium <157m, cholesterol <204m calories 30% from fat and 7% or less from saturated fats, daily to have 5 or more servings of fruits and vegetables.  Education: All About Nutrition: -Group instruction provided by verbal, written material, interactive activities, discussions, models, and posters to present general guidelines for heart healthy nutrition including fat, fiber, MyPlate, the role of sodium in heart healthy nutrition, utilization of the nutrition label, and utilization of this knowledge for meal planning. Follow up email sent as  well. Written material given at graduation. Flowsheet Row Cardiac  Rehab from 03/10/2021 in [facility] Cardiac and Pulmonary Rehab  Education need identified 02/17/21       Biometrics:  Pre Biometrics - 02/17/21 0924       Pre Biometrics   Height _0  (1.803 m)    Weight 209 lb 4.8 oz (94.9 kg)    BMI (Calculated) 29.2    Single Leg Stand 2.7 seconds              Nutrition Therapy Plan and Nutrition Goals:  Nutrition Therapy & Goals - 02/17/21 1508       Intervention Plan   Intervention Prescribe, educate and counsel regarding individualized specific dietary modifications aiming towards targeted core components such as weight, hypertension, lipid management, diabetes, heart failure and other comorbidities.    Expected Outcomes Short Term Goal: Understand basic principles of dietary content, such as calories, fat, sodium, cholesterol and nutrients.;Short Term Goal: A plan has been developed with personal nutrition goals set during dietitian appointment.;Long Term Goal: Adherence to prescribed nutrition plan.             Nutrition Assessments:  MEDIFICTS Score Key: ?70 Need to make dietary changes  40-70 Heart Healthy Diet ? 40 Therapeutic Level Cholesterol Diet  Flowsheet Row Cardiac Rehab from 02/17/2021 in [facility] Cardiac and Pulmonary Rehab  Picture Your Plate Total Score on Admission 69      Picture Your Plate Scores: <62 Unhealthy dietary pattern with much room for improvement. 41-50 Dietary pattern unlikely to meet recommendations for good health and room for improvement. 51-60 More healthful dietary pattern, with some room for improvement.  >60 Healthy dietary pattern, although there may be some specific behaviors that could be improved.    Nutrition Goals Re-Evaluation:   Nutrition Goals Discharge (Final Nutrition Goals Re-Evaluation):   Psychosocial: Target Goals: Acknowledge presence or absence of significant depression and/or stress, maximize coping  skills, provide positive support system. Participant is able to verbalize types and ability to use techniques and skills needed for reducing stress and depression.   Education: Stress, Anxiety, and Depression - Group verbal and visual presentation to define topics covered.  Reviews how body is impacted by stress, anxiety, and depression.  Also discusses healthy ways to reduce stress and to treat/manage anxiety and depression.  Written material given at graduation. Flowsheet Row Cardiac Rehab from 03/10/2021 in [facility] Cardiac and Pulmonary Rehab  Date 02/24/21  Educator [facility]  Instruction Review Code 1- Verbalizes Understanding       Education: Sleep Hygiene -Provides group verbal and written instruction about how sleep can affect your health.  Define sleep hygiene, discuss sleep cycles and impact of sleep habits. Review good sleep hygiene tips.    Initial Review & Psychosocial Screening:  Initial Psych Review & Screening - 02/11/21 1006       Initial Review   Current issues with Current Sleep Concerns;Current Anxiety/Panic      Family Dynamics   Good Support System? Yes   wife     Barriers   Psychosocial barriers to participate in program There are no identifiable barriers or psychosocial needs.;The patient should benefit from training in stress management and relaxation.      Screening Interventions   Interventions Encouraged to exercise;Provide feedback about the scores to participant;To provide support and resources with identified psychosocial needs    Expected Outcomes Short Term goal: Utilizing psychosocial counselor, staff and physician to assist with identification of specific Stressors or current issues interfering with healing process. Setting desired goal for each stressor or  current issue identified.;Long Term Goal: Stressors or current issues are controlled or eliminated.;Short Term goal: Identification and review with participant of any Quality of Life or Depression concerns  found by scoring the questionnaire.;Long Term goal: The participant improves quality of Life and PHQ9 Scores as seen by post scores and/or verbalization of changes             Quality of Life Scores:   Quality of Life - 02/17/21 1416       Quality of Life   Select Quality of Life      Quality of Life Scores   Health/Function Pre 26 %    Socioeconomic Pre 23.25 %    Psych/Spiritual Pre 24.93 %    Family Pre 28.8 %    GLOBAL Pre 25.56 %            Scores of 19 and below usually indicate a poorer quality of life in these areas.  A difference of  2-3 points is a clinically meaningful difference.  A difference of 2-3 points in the total score of the Quality of Life Index has been associated with significant improvement in overall quality of life, self-image, physical symptoms, and general health in studies assessing change in quality of life.  PHQ-9: Recent Review Flowsheet Data     Depression screen [facility] 2/9 02/17/2021   Decreased Interest 0   Down, Depressed, Hopeless 0   PHQ - 2 Score 0   Altered sleeping 1   Tired, decreased energy 2   Change in appetite 0   Feeling bad or failure about yourself  0   Trouble concentrating 0   Moving slowly or fidgety/restless 0   Suicidal thoughts 0   PHQ-9 Score 3   Difficult doing work/chores Not difficult at all      Interpretation of Total Score  Total Score Depression Severity:  1-4 = Minimal depression, 5-9 = Mild depression, 10-14 = Moderate depression, 15-19 = Moderately severe depression, 20-27 = Severe depression   Psychosocial Evaluation and Intervention:  Psychosocial Evaluation - 02/11/21 1031       Psychosocial Evaluation & Interventions   Interventions Encouraged to exercise with the program and follow exercise prescription;Stress management education;Relaxation education    Comments 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 He can tell a difference in his stamina as well. He does have some hip pain that  hinders him from walking as much as he wishes, but he is ready to get started in the program to help gain some strength and lose weight. He does state he has anxiety daily but has had it for a long time so he feels like he manages it well. It can present itself in arriving 30 minutes early to an appt in fear of being late, for example. His wife his is main support system and helps him manage his medications. His sleep is distrubed by having to get up and use the bathroom frequently, sometimes its easy to fall back asleep other times it is not. He is hopeful this program will help with his understanding of how to better care for himself.    Expected Outcomes Short: attend cardiac rehab for education and exercise. Long: develop and maintain positive self care habits.    Continue Psychosocial Services  Follow up required by staff             Psychosocial Re-Evaluation:   Psychosocial Discharge (Final Psychosocial Re-Evaluation):   Vocational Rehabilitation: Provide vocational rehab assistance to qualifying candidates.  Vocational Rehab Evaluation & Intervention:  Vocational Rehab - 02/11/21 1031       Initial Vocational Rehab Evaluation & Intervention   Assessment shows need for Vocational Rehabilitation No             Education: Education Goals: Education classes will be provided on a variety of topics geared toward better understanding of heart health and risk factor modification. Participant will state understanding/return demonstration of topics presented as noted by education test scores.  Learning Barriers/Preferences:  Learning Barriers/Preferences - 02/11/21 1031       Learning Barriers/Preferences   Learning Barriers None    Learning Preferences None             General Cardiac Education Topics:  AED/CPR: - Group verbal and written instruction with the use of models to demonstrate the basic use of the AED with the basic ABC's of resuscitation.   Anatomy  and Cardiac Procedures: - Group verbal and visual presentation and models provide information about basic cardiac anatomy and function. Reviews the testing methods done to diagnose heart disease and the outcomes of the test results. Describes the treatment choices: Medical Management, Angioplasty, or Coronary Bypass Surgery for treating various heart conditions including Myocardial Infarction, Angina, Valve Disease, and Cardiac Arrhythmias.  Written material given at graduation.   Medication Safety: - Group verbal and visual instruction to review commonly prescribed medications for heart and lung disease. Reviews the medication, class of the drug, and side effects. Includes the steps to properly store meds and maintain the prescription regimen.  Written material given at graduation.   Intimacy: - Group verbal instruction through game format to discuss how heart and lung disease can affect sexual intimacy. Written material given at graduation.. Flowsheet Row Cardiac Rehab from 03/10/2021 in [facility] Cardiac and Pulmonary Rehab  Date 03/10/21  Educator [facility]  Instruction Review Code 1- Verbalizes Understanding       Know Your Numbers and Heart Failure: - Group verbal and visual instruction to discuss disease risk factors for cardiac and pulmonary disease and treatment options.  Reviews associated critical values for Overweight/Obesity, Hypertension, Cholesterol, and Diabetes.  Discusses basics of heart failure: signs/symptoms and treatments.  Introduces Heart Failure Zone chart for action plan for heart failure.  Written material given at graduation.   Infection Prevention: - Provides verbal and written material to individual with discussion of infection control including proper hand washing and proper equipment cleaning during exercise session. Flowsheet Row Cardiac Rehab from 03/10/2021 in [facility] Cardiac and Pulmonary Rehab  Education need identified 02/17/21  Date 02/17/21  Educator [place]   Instruction Review Code 1- Verbalizes Understanding       Falls Prevention: - Provides verbal and written material to individual with discussion of falls prevention and safety. Flowsheet Row Cardiac Rehab from 03/10/2021 in [facility] Cardiac and Pulmonary Rehab  Education need identified 02/17/21  Date 02/17/21  Educator [place]  Instruction Review Code 1- Verbalizes Understanding       Other: -Provides group and verbal instruction on various topics (see comments)   Knowledge Questionnaire Score:  Knowledge Questionnaire Score - 02/17/21 1416       Knowledge Questionnaire Score   Pre Score 23/26: Angina, Exercise, Nutrition             Core Components/Risk Factors/Patient Goals at Admission:  Personal Goals and Risk Factors at Admission - 02/17/21 1505       Core Components/Risk Factors/Patient Goals on Admission    Weight Management Yes;Weight Loss  Intervention Weight Management: Provide education and appropriate resources to help participant work on and attain dietary goals.;Weight Management: Develop a combined nutrition and exercise program designed to reach desired caloric intake, while maintaining appropriate intake of nutrient and fiber, sodium and fats, and appropriate energy expenditure required for the weight goal.;Weight Management/Obesity: Establish reasonable short term and long term weight goals.    Admit Weight 209 lb (94.8 kg)    Goal Weight: Short Term 205 lb (93 kg)    Goal Weight: Long Term 199 lb (90.3 kg)    Expected Outcomes Short Term: Continue to assess and modify interventions until short term weight is achieved;Long Term: Adherence to nutrition and physical activity/exercise program aimed toward attainment of established weight goal;Weight Loss: Understanding of general recommendations for a balanced deficit meal plan, which promotes 1-2 lb weight loss per week and includes a negative energy balance of [phone] kcal/d;Understanding recommendations for  meals to include 15-35% energy as protein, 25-35% energy from fat, 35-60% energy from carbohydrates, less than 261m of dietary cholesterol, 20-35 gm of total fiber daily;Understanding of distribution of calorie intake throughout the day with the consumption of 4-5 meals/snacks    Hypertension Yes    Intervention Provide education on lifestyle modifcations including regular physical activity/exercise, weight management, moderate sodium restriction and increased consumption of fresh fruit, vegetables, and low fat dairy, alcohol moderation, and smoking cessation.;Monitor prescription use compliance.    Expected Outcomes Short Term: Continued assessment and intervention until BP is < 140/943mHG in hypertensive participants. < 130/8030mG in hypertensive participants with diabetes, heart failure or chronic kidney disease.;Long Term: Maintenance of blood pressure at goal levels.    Lipids Yes    Intervention Provide education and support for participant on nutrition & aerobic/resistive exercise along with prescribed medications to achieve LDL <104m63mDL >40mg27m Expected Outcomes Short Term: Participant states understanding of desired cholesterol values and is compliant with medications prescribed. Participant is following exercise prescription and nutrition guidelines.;Long Term: Cholesterol controlled with medications as prescribed, with individualized exercise RX and with personalized nutrition plan. Value goals: LDL < 104mg,34m > 40 mg.             Education:Diabetes - Individual verbal and written instruction to review signs/symptoms of diabetes, desired ranges of glucose level fasting, after meals and with exercise. Acknowledge that pre and post exercise glucose checks will be done for 3 sessions at entry of program.   Core Components/Risk Factors/Patient Goals Review:    Core Components/Risk Factors/Patient Goals at Discharge (Final Review):    ITP Comments:  ITP Comments     Row Name  02/11/21 1003 02/17/21 0917 02/22/21 0918 03/16/21 0743     ITP Comments Inital telephone orientation completed. Diagnosis can be found in [facility] EP orientation scheduled for Thursday 10/6 at 8am. Completed 6MWT and gym orientation. Initial ITP created and sent for review to Dr. Mark MEmily Filbertcal Director. First full day of exercise!  Patient was oriented to gym and equipment including functions, settings, policies, and procedures.  Patient's individual exercise prescription and treatment plan were reviewed.  All starting workloads were established based on the results of the 6 minute walk test done at initial orientation visit.  The plan for exercise progression was also introduced and progression will be customized based on patient's performance and goals. 30 Day review completed. Medical Director ITP review done, changes made as directed, and signed approval by Medical Director.  Comments:  30 Day review completed. Medical Director ITP review done, changes made as directed, and signed approval by Medical Director.

## 2021-03-17 ENCOUNTER — Other Ambulatory Visit: Payer: Self-pay

## 2021-03-17 ENCOUNTER — Encounter: Payer: Medicare PPO | Admitting: *Deleted

## 2021-03-17 DIAGNOSIS — Z955 Presence of coronary angioplasty implant and graft: Secondary | ICD-10-CM | POA: Diagnosis not present

## 2021-03-17 NOTE — Progress Notes (Signed)
Daily Session Note  Patient Details  Name: Steve Warren MRN: [id number] Date of Birth: 1945-11-19 Referring Provider:   Flowsheet Row Cardiac Rehab from 02/17/2021 in [facility] Cardiac and Pulmonary Rehab  Referring Provider End, [name]  MD       Encounter Date: 03/17/2021  Check In:  Session Check In - 03/17/21 0957       Check-In   Supervising physician immediately available to respond to emergencies See telemetry face sheet for immediately available ER MD    Location [facility]-Cardiac & Pulmonary Rehab    Staff Present Nyoka Cowden, RN, BSN, Tyna Jaksch, MS, ASCM CEP, Exercise Physiologist    Virtual Visit No    Medication changes reported     No    Tobacco Cessation No Change    Warm-up and Cool-down Performed on first and last piece of equipment    Resistance Training Performed Yes    VAD Patient? No               Exercise Prescription Changes - 03/17/21 0900       Home Exercise Plan   Plans to continue exercise at Home (comment)   walking   Frequency Add 2 additional days to program exercise sessions.    Initial Home Exercises Provided 03/17/21             Social History   Tobacco Use  Smoking Status Former   Types: Cigarettes   Quit date: 2012   Years since quitting: 10.8  Smokeless Tobacco Never    Goals Met:  Independence with exercise equipment Exercise tolerated well No report of concerns or symptoms today  Goals Unmet:  Not Applicable  Comments: Pt able to follow exercise prescription today without complaint.  Will continue to monitor for progression.    Dr. Emily Filbert is Medical Director for [place].  Dr. Ottie Glazier is Medical Director for [facility] Pulmonary Rehabilitation.

## 2021-03-22 ENCOUNTER — Other Ambulatory Visit: Payer: Self-pay

## 2021-03-22 DIAGNOSIS — Z955 Presence of coronary angioplasty implant and graft: Secondary | ICD-10-CM

## 2021-03-22 NOTE — Progress Notes (Signed)
Completed initial RD consultation ?

## 2021-03-22 NOTE — Progress Notes (Signed)
Daily Session Note  Patient Details  Name: VERDELL DYKMAN MRN: [id number] Date of Birth: 08-27-1945 Referring Provider:   Flowsheet Row Cardiac Rehab from 02/17/2021 in [facility] Cardiac and Pulmonary Rehab  Referring Provider End, [name]  MD       Encounter Date: 03/22/2021  Check In:  Session Check In - 03/22/21 0939       Check-In   Supervising physician immediately available to respond to emergencies See telemetry face sheet for immediately available ER MD    Location [facility]-Cardiac & Pulmonary Rehab    Staff Present Birdie Sons, MPA, RN;Amanda Oletta Darter, BA, ACSM CEP, Exercise Physiologist;Melissa Caiola, RDN, LDN    Virtual Visit No    Medication changes reported     No    Fall or balance concerns reported    No    Tobacco Cessation No Change    Warm-up and Cool-down Performed on first and last piece of equipment    Resistance Training Performed Yes    VAD Patient? No    PAD/SET Patient? No      Pain Assessment   Currently in Pain? No/denies                Social History   Tobacco Use  Smoking Status Former   Types: Cigarettes   Quit date: 2012   Years since quitting: 10.8  Smokeless Tobacco Never    Goals Met:  Independence with exercise equipment Exercise tolerated well No report of concerns or symptoms today Strength training completed today  Goals Unmet:  Not Applicable  Comments: Pt able to follow exercise prescription today without complaint.  Will continue to monitor for progression.    Dr. Emily Filbert is Medical Director for [place].  Dr. Ottie Glazier is Medical Director for [facility] Pulmonary Rehabilitation.

## 2021-03-24 ENCOUNTER — Other Ambulatory Visit: Payer: Self-pay

## 2021-03-24 DIAGNOSIS — Z955 Presence of coronary angioplasty implant and graft: Secondary | ICD-10-CM

## 2021-03-24 NOTE — Progress Notes (Signed)
Daily Session Note  Patient Details  Name: Steve Warren MRN: [id number] Date of Birth: [birth year] Referring Provider:   Flowsheet Row Cardiac Rehab from 02/17/2021 in [facility] Cardiac and Pulmonary Rehab  Referring Provider End, [name]  MD       Encounter Date: 03/24/2021  Check In:  Session Check In - 03/24/21 0919       Check-In   Supervising physician immediately available to respond to emergencies See telemetry face sheet for immediately available ER MD    Location [facility]-Cardiac & Pulmonary Rehab    Staff Present Birdie Sons, MPA, RN;Jessica Luan Pulling, MA, RCEP, CCRP, [place], BS, ACSM CEP, Exercise Physiologist    Virtual Visit No    Medication changes reported     No    Fall or balance concerns reported    No    Tobacco Cessation No Change    Warm-up and Cool-down Performed on first and last piece of equipment    Resistance Training Performed Yes    VAD Patient? No    PAD/SET Patient? No      Pain Assessment   Currently in Pain? No/denies                Social History   Tobacco Use  Smoking Status Former   Types: Cigarettes   Quit date: 2012   Years since quitting: 10.8  Smokeless Tobacco Never    Goals Met:  Independence with exercise equipment Exercise tolerated well No report of concerns or symptoms today Strength training completed today  Goals Unmet:  Not Applicable  Comments: Pt able to follow exercise prescription today without complaint.  Will continue to monitor for progression.    Dr. Emily Filbert is Medical Director for [place].  Dr. Ottie Glazier is Medical Director for [facility] Pulmonary Rehabilitation.

## 2021-03-28 ENCOUNTER — Telehealth: Payer: Self-pay | Admitting: *Deleted

## 2021-03-28 NOTE — Telephone Encounter (Signed)
Left voicemail message to call back for review of his My Chart message.     See My Chart messages.   Rise Mu, PA-C  13 minutes ago (1:26 PM)   Please have him hold metoprolol if he continues to take this.  Please also inquire if he has a brachial or wrist sphygmomanometer.  It would be beneficial for him to bring his BP cuff to the office at his next appointment for comparison given reported significant hypotension.  ED precautions.    You  Rise Mu, PA-C 5 hours ago (8:36 AM)   I did send message to see if he was still taking half tablet of his metoprolol so waiting to hear back from him on that.    You  Vallez, [place] "[place]" 5 hours ago (8:20 AM)   Good morning, Are you still taking half tablet of your Metoprolol?     This MyChart message has not been read.   Lamar Laundry, RN routed conversation to You 5 hours ago (7:49 AM)   Hassell Done, [place] "[place]"  P Cv Div [place] Triage (supporting Sindy Messing) 2 days ago   At 01-31-2021 appointment watching for dizziness was mentioned.  There have been a few light-headed events with short time-span, with none that I would term as dizziness that would lead to a fall.  Today a light-headed event happened when the BP cuff was readily available.  The following is an account of the results. 9:26AM BP:65/47 HR:81 we took 3 times and remained 60s/40s. Drank 10 oz water. 9:49AM BP:94/58 HR:75 Continued to drink second glass of water and at 10:37AM  BP: 135/72  HR:58 on Left and right arm readings BP:127/76 HR:59.  The pulse felt regular and strong. Other possible factors--Fluid intake on 03-25-2021 was approximately 46 ounces but only a sip of water with night time meds around 9:30 PM.  Intake of food and fluids prior to 9:26AM (03-26-2021) Ate a muffin and drank 8 ounces of coffee, took the meds (metoprolol 12.5 mg, Clopidogrel 75mg , and 81 mg asprin) with coffee approximately 40 to 60 minutes before the light-headed  event. Question--how concerned should we be with the low BP readings? Advice of fluid and taking medication.   Thank you.

## 2021-03-28 NOTE — Telephone Encounter (Signed)
Spoke with patient and we reviewed his information sent to [place] via My Chart. He did confirm that he is only taking half a tablet of the metoprolol. Patient reports that his neighbor is a Marine scientist and she came over to check his readings when they were low. She mentioned that he looked a little dehydrated and then he drank some water with his blood pressures then improving. We discussed his low readings, how he feels, and also continued monitoring. Instructed him to keep a log of those readings to bring into his next appointment and to bring in his blood pressure cuff so we can check the accuracy. He verbalized understanding of our conversation, agreement with plan, and no further questions at this time.

## 2021-03-29 ENCOUNTER — Other Ambulatory Visit: Payer: Self-pay

## 2021-03-29 DIAGNOSIS — Z955 Presence of coronary angioplasty implant and graft: Secondary | ICD-10-CM | POA: Diagnosis not present

## 2021-03-29 NOTE — Progress Notes (Signed)
Daily Session Note  Patient Details  Name: Steve Warren MRN: [id number] Date of Birth: 04-Sep-1945 Referring Provider:   Flowsheet Row Cardiac Rehab from 02/17/2021 in [facility] Cardiac and Pulmonary Rehab  Referring Provider End, [name]  MD       Encounter Date: 03/29/2021  Check In:  Session Check In - 03/29/21 0927       Check-In   Supervising physician immediately available to respond to emergencies See telemetry face sheet for immediately available ER MD    Location [facility]-Cardiac & Pulmonary Rehab    Staff Present Birdie Sons, MPA, RN;Amanda Oletta Darter, BA, ACSM CEP, Exercise Physiologist;Jessica [place], MA, RCEP, CCRP, Marylynn Pearson, MS, ASCM CEP, Exercise Physiologist    Virtual Visit No    Medication changes reported     No    Fall or balance concerns reported    No    Tobacco Cessation No Change    Warm-up and Cool-down Performed on first and last piece of equipment    Resistance Training Performed Yes    VAD Patient? No    PAD/SET Patient? No      Pain Assessment   Currently in Pain? No/denies                Social History   Tobacco Use  Smoking Status Former   Types: Cigarettes   Quit date: 2012   Years since quitting: 10.8  Smokeless Tobacco Never    Goals Met:  Independence with exercise equipment Exercise tolerated well Personal goals reviewed No report of concerns or symptoms today Strength training completed today  Goals Unmet:  Not Applicable  Comments: Pt able to follow exercise prescription today without complaint.  Will continue to monitor for progression.    Dr. Emily Filbert is Medical Director for [place].  Dr. Ottie Glazier is Medical Director for [facility] Pulmonary Rehabilitation.

## 2021-03-31 ENCOUNTER — Other Ambulatory Visit: Payer: Self-pay

## 2021-03-31 DIAGNOSIS — Z955 Presence of coronary angioplasty implant and graft: Secondary | ICD-10-CM | POA: Diagnosis not present

## 2021-03-31 NOTE — Progress Notes (Signed)
Daily Session Note  Patient Details  Name: Steve Warren MRN: [id number] Date of Birth: 06/26/1945 Referring Provider:   Flowsheet Row Cardiac Rehab from 02/17/2021 in [facility] Cardiac and Pulmonary Rehab  Referring Provider End, [name]  MD       Encounter Date: 03/31/2021  Check In:  Session Check In - 03/31/21 0917       Check-In   Supervising physician immediately available to respond to emergencies See telemetry face sheet for immediately available ER MD    Location [facility]-Cardiac & Pulmonary Rehab    Staff Present Birdie Sons, MPA, Nino Glow, MS, ASCM CEP, Exercise Physiologist;Amanda Oletta Darter, BA, ACSM CEP, Exercise Physiologist;Nolita Kutter Amedeo Plenty, BS, ACSM CEP, Exercise Physiologist    Virtual Visit No    Medication changes reported     No    Fall or balance concerns reported    No    Tobacco Cessation No Change    Warm-up and Cool-down Performed on first and last piece of equipment    Resistance Training Performed Yes    VAD Patient? No    PAD/SET Patient? No      Pain Assessment   Currently in Pain? No/denies                Social History   Tobacco Use  Smoking Status Former   Types: Cigarettes   Quit date: 2012   Years since quitting: 10.8  Smokeless Tobacco Never    Goals Met:  Independence with exercise equipment Exercise tolerated well No report of concerns or symptoms today Strength training completed today  Goals Unmet:  Not Applicable  Comments: Pt able to follow exercise prescription today without complaint.  Will continue to monitor for progression.    Dr. Emily Filbert is Medical Director for [place].  Dr. Ottie Glazier is Medical Director for [facility] Pulmonary Rehabilitation.

## 2021-04-05 ENCOUNTER — Other Ambulatory Visit: Payer: Self-pay

## 2021-04-05 DIAGNOSIS — Z955 Presence of coronary angioplasty implant and graft: Secondary | ICD-10-CM

## 2021-04-05 NOTE — Progress Notes (Signed)
Daily Session Note  Patient Details  Name: Steve Warren MRN: [id number] Date of Birth: [birth year] Referring Provider:   Flowsheet Row Cardiac Rehab from 02/17/2021 in [facility] Cardiac and Pulmonary Rehab  Referring Provider End, [name]  MD       Encounter Date: 04/05/2021  Check In:  Session Check In - 04/05/21 0926       Check-In   Supervising physician immediately available to respond to emergencies See telemetry face sheet for immediately available ER MD    Location [facility]-Cardiac & Pulmonary Rehab    Staff Present Birdie Sons, MPA, Nino Glow, MS, ASCM CEP, Exercise Physiologist;Melissa [place], RDN, Rowe Pavy, BA, ACSM CEP, Exercise Physiologist    Virtual Visit No    Medication changes reported     No    Fall or balance concerns reported    No    Tobacco Cessation No Change    Warm-up and Cool-down Performed on first and last piece of equipment    Resistance Training Performed Yes    VAD Patient? No    PAD/SET Patient? No      Pain Assessment   Currently in Pain? No/denies                Social History   Tobacco Use  Smoking Status Former   Types: Cigarettes   Quit date: 2012   Years since quitting: 10.8  Smokeless Tobacco Never    Goals Met:  Independence with exercise equipment Exercise tolerated well No report of concerns or symptoms today Strength training completed today  Goals Unmet:  Not Applicable  Comments: Pt able to follow exercise prescription today without complaint.  Will continue to monitor for progression.    Dr. Emily Filbert is Medical Director for [place].  Dr. Ottie Glazier is Medical Director for [facility] Pulmonary Rehabilitation.

## 2021-04-07 NOTE — Progress Notes (Signed)
Cardiology Office Note    Date:  04/13/2021   ID:  Steve Warren, Steve Warren December 24, 1945, MRN [id number]  PCP:  Sofie Hartigan, MD  Cardiologist:  Nelva Bush, MD  Electrophysiologist:  None   Chief Complaint: Follow-up  History of Present Illness:   Steve Warren is a 75 y.o. male with history of CAD status post PCI to the RCA on 01/04/2021, aortic atherosclerosis, systolic dysfunction, CKD stage IIIa, MGUS, essential tremor, chronic dizziness, HLD, BPH, and GERD who presents for follow up of his CAD.   He was admitted to [facility] in 11/2020 with a 4-day history of intermittent chest discomfort described as a sharp sensation in the center of his chest that radiated to his back and seemed to be associated primarily with activity, though also wondered if eating precipitated it.  There was some shortness of breath when the pain occurred.  High-sensitivity troponin peaked at 86.  BNP 112.  EKG demonstrated sinus rhythm with no acute ischemic ST-T changes. Chest x-ray showed low lung volumes without acute abnormality.  CTA chest/abdomen/pelvis showed no acute vascular abnormality with aortic atherosclerosis as well as at least two-vessel coronary artery calcifications, stable pulmonary nodules, colonic diverticulosis without acute diverticulitis, and severe degenerative changes within the lower lumbar spine noted.  Echo showed an EF of 45 to 50%, mild LVH, dyskinesis of the left ventricular, basal mid inferolateral wall, and inferior wall, grade 1 diastolic dysfunction, normal RV systolic function and ventricular cavity size, and trivial mitral regurgitation.  Lexiscan MPI showed no evidence of significant ischemia and was overall low risk.   He was seen in follow-up later in 11/2020 and was doing reasonably well from a cardiac perspective.  He did continue to note randomly occurring intermittent episodes of chest discomfort that were not as severe as what he experienced leading to his hospital  admission.  It was noted he was experiencing episodes of hypotension following transition from Toprol to carvedilol by outside office.  In this setting, he was transitioned back to Toprol.  Given his stable to improving symptoms, continued medical therapy was recommended.  However, we received a message later that month that he was experiencing worsening exertional dyspnea and angina that improved with rest.  In this setting, he underwent diagnostic R/LHC on 01/04/2021 which demonstrated chronic total/subtotal occlusion of the mid RCA as well as 30% proximal and mid LAD stenoses.  He underwent successful PCI to the mid RCA.  Normal LVEDP.  He was seen in hospital follow-up in 01/2021 and was doing well from a cardiac perspective.  Following his PCI, he was experiencing some dizziness.  Upon reviewing his medications, he was taking a full 25 mg of Toprol-XL and had associated hypotension with BPs in the 90s over 50s.  Following decrease of Toprol to the previously recommended 12.5 mg his BP had improved to the 1 teens to 932I systolic with resolution of dizziness.  Following PCI, he noted resolution of exertional chest discomfort and dyspnea.  Following his appointment in September, we received a MyChart message earlier this month with reported significant hypotension in the 60s over 40s with associated lightheadedness with BP spontaneously improving into the 712W systolic.  He was advised to hold metoprolol if he had continued to take that medication and ED precautions were given.  He comes in accompanied by his wife today and continues to do well from a cardiac perspective.  Upon reviewing his BP chart from home, on 11/12, around 8:30 AM, BP was checked  at home and noted to be in the 60s over 40s, which subsequently improved up into the 161W systolic shortly thereafter after drinking some fluids.  Patient did report he felt some dizziness associated with this, though was otherwise without complaints, including  angina.  He does continue to note some intermittent positional dizziness with subsequent BP readings typically in the low 960A systolic with a rare reading in the 54U systolic.  He has continued to take metoprolol throughout all of the above.  He does overall continue to note a significant improvement in his overall functional status following PCI as outlined above.  He has been participating in cardiac rehab without issues.  He does feel like he has had a couple of episodes of palpitations, though does not recall specific palpitations associated with his episode on 11/12.  No angina, dyspnea, presyncope, or syncope.  No falls, hematochezia, or hematemesis.     Labs independently reviewed: 01/2021 - Hgb 15.3, PLT 188, potassium 4.8, BUN 24, serum creatinine 1.49, TC 116, TG 99, HDL 36, LDL 60 11/2020 - albumin 4.2, AST/ALT normal, magnesium 1.9    Past Medical History:  Diagnosis Date   Anxiety    BPH (benign prostatic hyperplasia)    CAD in native artery    Cataract    bilateral- Dec 2021 and jan 2022   GERD (gastroesophageal reflux disease)    Hypercholesterolemia    Hyperlipidemia    Hypertension     Past Surgical History:  Procedure Laterality Date   APPENDECTOMY     CATARACT EXTRACTION, BILATERAL     COLONOSCOPY     CORONARY STENT INTERVENTION N/A 01/04/2021   Procedure: CORONARY STENT INTERVENTION;  Surgeon: Nelva Bush, MD;  Location: [place] [facility];  Service: Cardiovascular;  Laterality: N/A;   RIGHT/LEFT HEART CATH AND CORONARY ANGIOGRAPHY Bilateral 01/04/2021   Procedure: RIGHT/LEFT HEART CATH AND CORONARY ANGIOGRAPHY;  Surgeon: Nelva Bush, MD;  Location: [place] [facility];  Service: Cardiovascular;  Laterality: Bilateral;   TONSILLECTOMY      Current Medications: Current Meds  Medication Sig   aspirin EC 81 MG EC tablet Take 1 tablet (81 mg total) by mouth daily. Swallow whole.   atorvastatin (LIPITOR) 40 MG tablet Take 1 tablet (40 mg total) by mouth  daily.   Bacillus Coagulans-Inulin (PROBIOTIC) 1-250 BILLION-MG CAPS Take 1 capsule by mouth daily.   clopidogrel (PLAVIX) 75 MG tablet Take 1 tablet (75 mg total) by mouth daily.   Cyanocobalamin (VITAMIN B 12 PO) Take 1,000 mcg by mouth daily at 12 noon.   erythromycin ophthalmic ointment Place 1 application into both eyes at bedtime.   primidone (MYSOLINE) 50 MG tablet Take 50 mg by mouth at bedtime.   Propylene Glycol (SYSTANE COMPLETE OP) Place 1 drop into both eyes in the morning, at noon, and at bedtime.   tamsulosin (FLOMAX) 0.4 MG CAPS capsule Take 0.4 mg by mouth daily after supper.   [DISCONTINUED] metoprolol succinate (TOPROL XL) 25 MG 24 hr tablet Take 0.5 tablets (12.5 mg total) by mouth daily.    Allergies:   Patient has no known allergies.   Social History   Socioeconomic History   Marital status: Married    Spouse name: Not on file   Number of children: Not on file   Years of education: Not on file   Highest education level: Not on file  Occupational History   Not on file  Tobacco Use   Smoking status: Former    Types: Cigarettes  Quit date: 2012    Years since quitting: 10.9   Smokeless tobacco: Never  Vaping Use   Vaping Use: Never used  Substance and Sexual Activity   Alcohol use: Yes    Comment: once a month liquor   Drug use: Never   Sexual activity: Not Currently  Other Topics Concern   Not on file  Social History Narrative   Not on file   Social Determinants of Health   Financial Resource Strain: Not on file  Food Insecurity: Not on file  Transportation Needs: Not on file  Physical Activity: Not on file  Stress: Not on file  Social Connections: Not on file     Family History:  The patient's family history includes Alzheimer's disease in his father; Diabetes in his maternal grandmother.  ROS:   Review of Systems  Constitutional:  Negative for chills, diaphoresis, fever, malaise/fatigue and weight loss.  HENT:  Negative for congestion.    Eyes:  Negative for discharge and redness.  Respiratory:  Negative for cough, sputum production, shortness of breath and wheezing.   Cardiovascular:  Positive for palpitations. Negative for chest pain, orthopnea, claudication, leg swelling and PND.  Gastrointestinal:  Negative for abdominal pain, blood in stool, heartburn, melena, nausea and vomiting.  Musculoskeletal:  Negative for falls and myalgias.  Skin:  Negative for rash.  Neurological:  Positive for dizziness. Negative for tingling, tremors, sensory change, speech change, focal weakness, loss of consciousness and weakness.  Endo/Heme/Allergies:  Does not bruise/bleed easily.  Psychiatric/Behavioral:  Negative for substance abuse. The patient is not nervous/anxious.   All other systems reviewed and are negative.   EKGs/Labs/Other Studies Reviewed:    Studies reviewed were summarized above. The additional studies were reviewed today:  [facility] 12/2020: Conclusion Coronary artery disease including chronic total/subtotal occlusion of the mid RCA, and 30% proximal and mid LAD. Normal left ventricular end-diastolic pressure of 15 mmHg RA 8, RV 25/11, PCP WP 11, PA 29/10 (19) CO 6.77, CI 3.15 Successful PCI to the mid RCA with placement of a 3.5 x 22 mm Onyx DES with excellent angiographic result and TIMI-3 flow   Recommendation Dual antiplatelet therapy with aspirin and Plavix for 6 months, ideally longer Ongoing aggressive secondary prevention Admit overnight for monitoring.  Discharge in the morning. __________   2D echo 11/16/2020: 1. Left ventricular ejection fraction, by estimation, is 45 to 50%. The  left ventricle has mildly decreased function. The left ventricle  demonstrates regional wall motion abnormalities (see scoring  diagram/findings for description). There is mild left  ventricular hypertrophy. Left ventricular diastolic parameters are  consistent with Grade I diastolic dysfunction (impaired relaxation). There  is  dyskinesis of the left ventricular, basal-mid inferolateral wall and  inferior wall.   2. Right ventricular systolic function is normal. The right ventricular  size is normal.   3. The mitral valve was not well visualized. Trivial mitral valve  regurgitation.   4. The aortic valve was not well visualized. Aortic valve regurgitation  is not visualized. No aortic stenosis is present. __________   [facility] Adam MPI 11/17/2020: The study is normal. This is a low risk study. The left ventricular ejection fraction is hyperdynamic (>65%). There is no evidence for ischemia    EKG:  EKG is ordered today.  The EKG ordered today demonstrates NSR, 60 bpm, no acute ST-T changes, when compared to prior tracing no significant change  Recent Labs: 11/15/2020: B Natriuretic Peptide 112.5 11/17/2020: Magnesium 1.9 01/27/2021: ALT 18; BUN 24; Creatinine, Ser  1.49; Hemoglobin 15.3; Platelets 188; Potassium 4.8; Sodium 131  Recent Lipid Panel    Component Value Date/Time   CHOL 116 01/27/2021 0927   TRIG 99 01/27/2021 0927   HDL 36 (L) 01/27/2021 0927   CHOLHDL 3.2 01/27/2021 0927   VLDL 20 01/27/2021 0927   LDLCALC 60 01/27/2021 0927    PHYSICAL EXAM:    VS:  BP 120/66 (BP Location: Left Arm, Patient Position: Sitting, Cuff Size: Normal)   Pulse 60   Ht 5\' 11"  (1.803 m)   Wt 206 lb (93.4 kg)   SpO2 98%   BMI 28.73 kg/m   BMI: Body mass index is 28.73 kg/m.  Physical Exam Vitals reviewed.  Constitutional:      Appearance: He is well-developed.  HENT:     Head: Normocephalic and atraumatic.  Eyes:     General:        Right eye: No discharge.        Left eye: No discharge.  Neck:     Vascular: No JVD.  Cardiovascular:     Rate and Rhythm: Normal rate and regular rhythm.     Pulses:          Posterior tibial pulses are 2+ on the right side and 2+ on the left side.     Heart sounds: Normal heart sounds, S1 normal and S2 normal. Heart sounds not distant. No midsystolic click and no opening  snap. No murmur heard.   No friction rub.  Pulmonary:     Effort: Pulmonary effort is normal. No respiratory distress.     Breath sounds: Normal breath sounds. No decreased breath sounds, wheezing or rales.  Chest:     Chest wall: No tenderness.  Abdominal:     General: There is no distension.     Palpations: Abdomen is soft.     Tenderness: There is no abdominal tenderness.  Musculoskeletal:     Cervical back: Normal range of motion.     Right lower leg: No edema.     Left lower leg: No edema.  Skin:    General: Skin is warm and dry.     Nails: There is no clubbing.  Neurological:     Mental Status: He is alert and oriented to person, place, and time.  Psychiatric:        Speech: Speech normal.        Behavior: Behavior normal.        Thought Content: Thought content normal.        Judgment: Judgment normal.    Wt Readings from Last 3 Encounters:  04/13/21 206 lb (93.4 kg)  02/17/21 209 lb 4.8 oz (94.9 kg)  01/31/21 209 lb (94.8 kg)     ASSESSMENT & PLAN:   CAD involving the native coronary arteries without angina: He continues to do well without symptoms concerning for angina.  Continue ASA and clopidogrel without interruption for at least 6 months from date of PCI, ideally longer.  Aggressive risk factor modification and secondary prevention including continued atorvastatin is recommended.  Metoprolol has been held as outlined below, given hypotension.  No indication for further ischemic testing at this time.  Systolic dysfunction: He appears euvolemic and well compensated.  Hopefully, with PCI we will note an improvement in his LV systolic function.  Metoprolol has been held given hypotension as outlined below.  Given soft BP we will continue to defer the addition of ARB with recommendation to escalate GDMT as/if able.  MRA has been  deferred given underlying renal dysfunction.  Hypotension: The etiology of his episode of hypotension noted at home on 96/78 remains uncertain.   This was an isolated event and did not persist for a long period of time with noted improvement in blood pressure shortly thereafter up into the 938B systolic.  It was felt the patient did not have much to drink the day prior and earlier that morning.  He had also recently taken his metoprolol.  There were no symptoms of angina or palpitations with this episode.  BP overall has been largely stable in the low 100s, with occasional readings in the 01B systolic.  We will discontinue metoprolol given hypotension with recommendation for continued close BP monitoring.  Check CBC and BMP.  Palpitations: Place Zio patch.  CKD stage IIIa: Stable on recent check.  HLD: LDL 60 from 01/2021.  He remains on atorvastatin.    Disposition: F/u with Dr. Saunders Revel or an APP in 4 to 6 weeks.   Medication Adjustments/Labs and Tests Ordered: Current medicines are reviewed at length with the patient today.  Concerns regarding medicines are outlined above. Medication changes, Labs and Tests ordered today are summarized above and listed in the Patient Instructions accessible in Encounters.   Signed, Christell Faith, PA-C 04/13/2021 1:15 PM     [address] [place], [place] [address] [phone]

## 2021-04-12 ENCOUNTER — Other Ambulatory Visit: Payer: Self-pay

## 2021-04-12 DIAGNOSIS — Z955 Presence of coronary angioplasty implant and graft: Secondary | ICD-10-CM

## 2021-04-12 NOTE — Progress Notes (Signed)
Daily Session Note  Patient Details  Name: Steve Warren MRN: [id number] Date of Birth: Jan 24, 1946 Referring Provider:   Flowsheet Row Cardiac Rehab from 02/17/2021 in [facility]  Referring Provider End, [name]  MD       Encounter Date: 04/12/2021  Check In:  Session Check In - 04/12/21 0911       Check-In   Supervising physician immediately available to respond to emergencies See telemetry face sheet for immediately available ER MD    Location [facility]-Cardiac & Pulmonary Rehab    Staff Present Birdie Sons, MPA, RN;Jessica Ault, MA, RCEP, CCRP, CCET;Amanda Sommer, BA, ACSM CEP, Exercise Physiologist    Virtual Visit No    Medication changes reported     Yes    Comments erythromycin ophthalmic ointment @ bedtime; OCuSoft Lid Scrub Plus BID AM & PM    Fall or balance concerns reported    No    Tobacco Cessation No Change    Warm-up and Cool-down Performed on first and last piece of equipment    Resistance Training Performed Yes    VAD Patient? No    PAD/SET Patient? No      Pain Assessment   Currently in Pain? No/denies                Social History   Tobacco Use  Smoking Status Former   Types: Cigarettes   Quit date: 2012   Years since quitting: 10.9  Smokeless Tobacco Never    Goals Met:  Independence with exercise equipment Exercise tolerated well No report of concerns or symptoms today Strength training completed today  Goals Unmet:  Not Applicable  Comments: Pt able to follow exercise prescription today without complaint.  Will continue to monitor for progression.    Dr. Emily Filbert is Medical Director for [facility].  Dr. Ottie Glazier is Medical Director for [facility] Pulmonary Rehabilitation.

## 2021-04-13 ENCOUNTER — Telehealth: Payer: Self-pay | Admitting: Physician Assistant

## 2021-04-13 ENCOUNTER — Encounter: Payer: Self-pay | Admitting: *Deleted

## 2021-04-13 ENCOUNTER — Encounter: Payer: Self-pay | Admitting: Physician Assistant

## 2021-04-13 ENCOUNTER — Ambulatory Visit (INDEPENDENT_AMBULATORY_CARE_PROVIDER_SITE_OTHER): Payer: Medicare PPO

## 2021-04-13 ENCOUNTER — Other Ambulatory Visit: Payer: Self-pay

## 2021-04-13 ENCOUNTER — Ambulatory Visit: Payer: Medicare PPO | Admitting: Physician Assistant

## 2021-04-13 VITALS — BP 120/66 | HR 60 | Ht 71.0 in | Wt 206.0 lb

## 2021-04-13 DIAGNOSIS — I519 Heart disease, unspecified: Secondary | ICD-10-CM | POA: Diagnosis not present

## 2021-04-13 DIAGNOSIS — R002 Palpitations: Secondary | ICD-10-CM | POA: Diagnosis not present

## 2021-04-13 DIAGNOSIS — I959 Hypotension, unspecified: Secondary | ICD-10-CM | POA: Diagnosis not present

## 2021-04-13 DIAGNOSIS — I2510 Atherosclerotic heart disease of native coronary artery without angina pectoris: Secondary | ICD-10-CM | POA: Diagnosis not present

## 2021-04-13 DIAGNOSIS — Z955 Presence of coronary angioplasty implant and graft: Secondary | ICD-10-CM

## 2021-04-13 DIAGNOSIS — E785 Hyperlipidemia, unspecified: Secondary | ICD-10-CM

## 2021-04-13 DIAGNOSIS — R42 Dizziness and giddiness: Secondary | ICD-10-CM

## 2021-04-13 DIAGNOSIS — N1831 Chronic kidney disease, stage 3a: Secondary | ICD-10-CM

## 2021-04-13 NOTE — Progress Notes (Signed)
Cardiac Individual Treatment Plan  Patient Details  Name: Steve Warren MRN: [id number] Date of Birth: [birth year] Referring Provider:   Flowsheet Row Cardiac Rehab from 02/17/2021 in [facility] Cardiac and Pulmonary Rehab  Referring Provider End, [name]  MD       Initial Encounter Date:  Flowsheet Row Cardiac Rehab from 02/17/2021 in [facility] Cardiac and Pulmonary Rehab  Date 02/17/21       Visit Diagnosis: Status post coronary artery stent placement  Patient's Home Medications on Admission:  Current Outpatient Medications:    aspirin EC 81 MG EC tablet, Take 1 tablet (81 mg total) by mouth daily. Swallow whole., Disp: 30 tablet, Rfl: 0   atorvastatin (LIPITOR) 40 MG tablet, Take 1 tablet (40 mg total) by mouth daily., Disp: 90 tablet, Rfl: 3   Bacillus Coagulans-Inulin (PROBIOTIC) 1-250 BILLION-MG CAPS, Take 1 capsule by mouth daily., Disp: , Rfl:    clopidogrel (PLAVIX) 75 MG tablet, Take 1 tablet (75 mg total) by mouth daily., Disp: 30 tablet, Rfl: 11   Cyanocobalamin (VITAMIN B 12 PO), Take 1,000 mcg by mouth daily at 12 noon., Disp: , Rfl:    metoprolol succinate (TOPROL XL) 25 MG 24 hr tablet, Take 0.5 tablets (12.5 mg total) by mouth daily., Disp: 45 tablet, Rfl: 1   primidone (MYSOLINE) 50 MG tablet, Take 50 mg by mouth at bedtime., Disp: , Rfl:    Propylene Glycol (SYSTANE COMPLETE OP), Place 1 drop into both eyes in the morning, at noon, and at bedtime., Disp: , Rfl:    tamsulosin (FLOMAX) 0.4 MG CAPS capsule, Take 0.4 mg by mouth daily after supper., Disp: , Rfl:   Past Medical History: Past Medical History:  Diagnosis Date   Anxiety    BPH (benign prostatic hyperplasia)    CAD in native artery    Cataract    bilateral- Dec 2021 and jan 2022   GERD (gastroesophageal reflux disease)    Hypercholesterolemia    Hyperlipidemia    Hypertension     Tobacco Use: Social History   Tobacco Use  Smoking Status Former   Types: Cigarettes   Quit date: 2012   Years since  quitting: 10.9  Smokeless Tobacco Never    Labs: Recent Review Management consultant for [facility] Cardiac and Pulmonary Rehab Latest Ref Rng & Units 11/16/2020 01/27/2021   Cholestrol 0 - 200 mg/dL 128 116   LDLCALC 0 - 99 mg/dL 77 60   HDL >40 mg/dL 40(L) 36(L)   Trlycerides <150 mg/dL 55 99        Exercise Target Goals: Exercise Program Goal: Individual exercise prescription set using results from initial 6 min walk test and THRR while considering  patient's activity barriers and safety.   Exercise Prescription Goal: Initial exercise prescription builds to 30-45 minutes a day of aerobic activity, 2-3 days per week.  Home exercise guidelines will be given to patient during program as part of exercise prescription that the participant will acknowledge.   Education: Aerobic Exercise: - Group verbal and visual presentation on the components of exercise prescription. Introduces F.I.T.T principle from ACSM for exercise prescriptions.  Reviews F.I.T.T. principles of aerobic exercise including progression. Written material given at graduation. Flowsheet Row Cardiac Rehab from 03/31/2021 in [facility] Cardiac and Pulmonary Rehab  Education need identified 02/17/21  Date 03/10/21  Educator [facility]  Instruction Review Code 1- Verbalizes Understanding       Education: Resistance Exercise: - Group verbal and visual presentation on the components of  exercise prescription. Introduces F.I.T.T principle from ACSM for exercise prescriptions  Reviews F.I.T.T. principles of resistance exercise including progression. Written material given at graduation. Flowsheet Row Cardiac Rehab from 03/31/2021 in [facility] Cardiac and Pulmonary Rehab  Date 03/17/21  Educator as  Instruction Review Code 1- Verbalizes Understanding        Education: Exercise & Equipment Safety: - Individual verbal instruction and demonstration of equipment use and safety with use of the equipment. Flowsheet Row Cardiac Rehab from 03/31/2021  in [facility] Cardiac and Pulmonary Rehab  Education need identified 02/17/21  Date 02/17/21  Educator [place]  Instruction Review Code 1- Verbalizes Understanding       Education: Exercise Physiology & General Exercise Guidelines: - Group verbal and written instruction with models to review the exercise physiology of the cardiovascular system and associated critical values. Provides general exercise guidelines with specific guidelines to those with heart or lung disease.  Flowsheet Row Cardiac Rehab from 03/31/2021 in [facility] Cardiac and Pulmonary Rehab  Education need identified 02/17/21  Date 03/03/21  Educator [facility]  Instruction Review Code 1- [employer] Understanding       Education: Flexibility, Balance, Mind/Body Relaxation: - Group verbal and visual presentation with interactive activity on the components of exercise prescription. Introduces F.I.T.T principle from ACSM for exercise prescriptions. Reviews F.I.T.T. principles of flexibility and balance exercise training including progression. Also discusses the mind body connection.  Reviews various relaxation techniques to help reduce and manage stress (i.e. Deep breathing, progressive muscle relaxation, and visualization). Balance handout provided to take home. Written material given at graduation. Flowsheet Row Cardiac Rehab from 03/31/2021 in [facility] Cardiac and Pulmonary Rehab  Date 03/24/21  Educator [facility]  Instruction Review Code 1- Verbalizes Understanding       Activity Barriers & Risk Stratification:  Activity Barriers & Cardiac Risk Stratification - 02/17/21 1453       Activity Barriers & Cardiac Risk Stratification   Activity Barriers Back Problems;Joint Problems;Deconditioning;Muscular Weakness             6 Minute Walk:  6 Minute Walk     Row Name 02/17/21 1452         6 Minute Walk   Phase Initial     Distance 1545 feet     Walk Time 6 minutes     # of Rest Breaks 0     MPH 2.92     METS 2.9     RPE 12      Perceived Dyspnea  0     VO2 Peak 10.15     Symptoms Yes (comment)     Comments Bilateral hip pain 2/10     Resting HR 72 bpm     Resting BP 110/64     Resting Oxygen Saturation  97 %     Exercise Oxygen Saturation  during 6 min walk 95 %     Max Ex. HR 109 bpm     Max Ex. BP 114/58     2 Minute Post BP 106/64              Oxygen Initial Assessment:   Oxygen Re-Evaluation:   Oxygen Discharge (Final Oxygen Re-Evaluation):   Initial Exercise Prescription:  Initial Exercise Prescription - 02/17/21 1500       Date of Initial Exercise RX and Referring Provider   Date 02/17/21    Referring Provider End, [name]  MD      Oxygen   Maintain Oxygen Saturation 88% or higher  NuStep   Level 3    SPM 80    Minutes 15    METs 2.9      T5 Nustep   Level 2    SPM 80    Minutes 15    METs 2.9      Track   Laps 35    Minutes 15    METs 2.9      Prescription Details   Frequency (times per week) 2    Duration Progress to 30 minutes of continuous aerobic without signs/symptoms of physical distress      Intensity   THRR 40-80% of Max Heartrate 100-129    Ratings of Perceived Exertion 11-13    Perceived Dyspnea 0-4      Progression   Progression Continue to progress workloads to maintain intensity without signs/symptoms of physical distress.      Resistance Training   Training Prescription Yes    Weight 3 lb    Reps 10-15             Perform Capillary Blood Glucose checks as needed.  Exercise Prescription Changes:   Exercise Prescription Changes     Row Name 02/17/21 1500 02/28/21 1000 03/14/21 1600 03/17/21 0900 03/28/21 1400     Response to Exercise   Blood Pressure (Admit) 110/64 132/76 130/70 -- 128/62   Blood Pressure (Exercise) 114/58 136/64 130/70 -- 130/64   Blood Pressure (Exit) 106/64 122/64 112/62 -- 132/62   Heart Rate (Admit) 72 bpm 73 bpm 79 bpm -- 72 bpm   Heart Rate (Exercise) 109 bpm 114 bpm 119 bpm -- 111 bpm   Heart Rate  (Exit) 77 bpm 100 bpm 100 bpm -- 102 bpm   Oxygen Saturation (Admit) 97 % -- -- -- --   Oxygen Saturation (Exercise) 95 % -- -- -- --   Oxygen Saturation (Exit) 97 % -- -- -- --   Rating of Perceived Exertion (Exercise) 12 12 13  -- 13   Perceived Dyspnea (Exercise) 0 -- -- -- --   Symptoms Bilateral hip pain 2/10 none none -- none   Comments walk test results 1st full day of exercise -- -- --   Duration -- Continue with 30 min of aerobic exercise without signs/symptoms of physical distress. Continue with 30 min of aerobic exercise without signs/symptoms of physical distress. -- Continue with 30 min of aerobic exercise without signs/symptoms of physical distress.   Intensity -- THRR unchanged THRR unchanged -- THRR unchanged     Progression   Progression -- Continue to progress workloads to maintain intensity without signs/symptoms of physical distress. Continue to progress workloads to maintain intensity without signs/symptoms of physical distress. -- Continue to progress workloads to maintain intensity without signs/symptoms of physical distress.   Average METs -- 2.51 3 -- 3.17     Resistance Training   Training Prescription -- Yes Yes -- Yes   Weight -- 3 lb 3 lb -- 4 lb   Reps -- 10-15 10-15 -- 10-15     Interval Training   Interval Training -- No No -- No     NuStep   Level -- 3 -- -- 3   Minutes -- 15 -- -- 15   METs -- 2.3 -- -- 3.5     T5 Nustep   Level -- 2 2 -- 2   Minutes -- 15 15 -- 15   METs -- 2.5 3.2 -- 3.1     Track   Laps -- 30 35 -- 35  Minutes -- 15 15 -- 15   METs -- 2.63 2.9 -- 2.9     Home Exercise Plan   Plans to continue exercise at -- -- -- Home (comment)  walking Home (comment)  walking   Frequency -- -- -- Add 2 additional days to program exercise sessions. Add 2 additional days to program exercise sessions.   Initial Home Exercises Provided -- -- -- 03/17/21 03/17/21    Row Name 04/11/21 1500             Response to Exercise   Blood  Pressure (Admit) 104/54       Blood Pressure (Exit) 110/58       Heart Rate (Admit) 88 bpm       Heart Rate (Exercise) 116 bpm       Heart Rate (Exit) 97 bpm       Rating of Perceived Exertion (Exercise) 13       Symptoms none       Duration Continue with 30 min of aerobic exercise without signs/symptoms of physical distress.       Intensity THRR unchanged         Progression   Progression Continue to progress workloads to maintain intensity without signs/symptoms of physical distress.       Average METs 3.65         Resistance Training   Training Prescription Yes       Weight 5 lb       Reps 10-15         Interval Training   Interval Training No         NuStep   Level 5       Minutes 15         Track   Laps 42       Minutes 15       METs 3.28         Home Exercise Plan   Plans to continue exercise at Home (comment)  walking       Frequency Add 2 additional days to program exercise sessions.       Initial Home Exercises Provided 03/17/21                Exercise Comments:   Exercise Comments     Row Name 02/22/21 0973           Exercise Comments First full day of exercise!  Patient was oriented to gym and equipment including functions, settings, policies, and procedures.  Patient's individual exercise prescription and treatment plan were reviewed.  All starting workloads were established based on the results of the 6 minute walk test done at initial orientation visit.  The plan for exercise progression was also introduced and progression will be customized based on patient's performance and goals.                Exercise Goals and Review:   Exercise Goals     Row Name 02/17/21 1505             Exercise Goals   Increase Physical Activity Yes       Intervention Provide advice, education, support and counseling about physical activity/exercise needs.;Develop an individualized exercise prescription for aerobic and resistive training based on initial  evaluation findings, risk stratification, comorbidities and participant's personal goals.       Expected Outcomes Short Term: Attend rehab on a regular basis to increase amount of physical activity.;Long Term: Add in home exercise to make  exercise part of routine and to increase amount of physical activity.;Long Term: Exercising regularly at least 3-5 days a week.       Increase Strength and Stamina Yes       Intervention Provide advice, education, support and counseling about physical activity/exercise needs.;Develop an individualized exercise prescription for aerobic and resistive training based on initial evaluation findings, risk stratification, comorbidities and participant's personal goals.       Expected Outcomes Short Term: Increase workloads from initial exercise prescription for resistance, speed, and METs.;Short Term: Perform resistance training exercises routinely during rehab and add in resistance training at home;Long Term: Improve cardiorespiratory fitness, muscular endurance and strength as measured by increased METs and functional capacity (6MWT)       Able to understand and use rate of perceived exertion (RPE) scale Yes       Intervention Provide education and explanation on how to use RPE scale       Expected Outcomes Short Term: Able to use RPE daily in rehab to express subjective intensity level;Long Term:  Able to use RPE to guide intensity level when exercising independently       Able to understand and use Dyspnea scale Yes       Intervention Provide education and explanation on how to use Dyspnea scale       Expected Outcomes Short Term: Able to use Dyspnea scale daily in rehab to express subjective sense of shortness of breath during exertion;Long Term: Able to use Dyspnea scale to guide intensity level when exercising independently       Knowledge and understanding of Target Heart Rate Range (THRR) Yes       Intervention Provide education and explanation of THRR including how  the numbers were predicted and where they are located for reference       Expected Outcomes Short Term: Able to state/look up THRR;Long Term: Able to use THRR to govern intensity when exercising independently;Short Term: Able to use daily as guideline for intensity in rehab       Able to check pulse independently Yes       Intervention Provide education and demonstration on how to check pulse in carotid and radial arteries.;Review the importance of being able to check your own pulse for safety during independent exercise       Expected Outcomes Short Term: Able to explain why pulse checking is important during independent exercise;Long Term: Able to check pulse independently and accurately       Understanding of Exercise Prescription Yes       Intervention Provide education, explanation, and written materials on patient's individual exercise prescription       Expected Outcomes Short Term: Able to explain program exercise prescription;Long Term: Able to explain home exercise prescription to exercise independently                Exercise Goals Re-Evaluation :  Exercise Goals Re-Evaluation     Row Name 02/22/21 1583 02/28/21 1014 03/14/21 1615 03/17/21 0936 03/28/21 1408     Exercise Goal Re-Evaluation   Exercise Goals Review Increase Physical Activity;Able to understand and use rate of perceived exertion (RPE) scale;Knowledge and understanding of Target Heart Rate Range (THRR);Understanding of Exercise Prescription;Increase Strength and Stamina;Able to check pulse independently;Able to understand and use Dyspnea scale Increase Physical Activity;Increase Strength and Stamina Increase Physical Activity;Increase Strength and Stamina Increase Physical Activity;Increase Strength and Stamina;Understanding of Exercise Prescription Increase Physical Activity;Increase Strength and Stamina;Understanding of Exercise Prescription   Comments Reviewed RPE and  dyspnea scales, THR and program prescription with  pt today.  Pt voiced understanding and was given a copy of goals to take home. Steve Warren is doing well the first couple of sessions that he has been here for rehab. So far he is working within appropriate RPEs and will continue to monitor progression as he progresses throughout the program. Steve Warren is progressing well and has increased average MET level and is up to 35 laps on the track.  We will continue to monitor progress. Reviewed home exercise with pt today.  Pt plans to walk and do weights for exercise. Him and his wife are thinking of joining the [employer] down the road. Reviewed THR, pulse, RPE, sign and symptoms, pulse oximetery and when to call 911 or MD.  Also discussed weather considerations and indoor options.  Pt voiced understanding. Steve Warren has done well in rehab.  He is up to 35 laps on the track and likes to walk first now!  We will continue to monitor his progress.   Expected Outcomes Short: Use RPE daily to regulate intensity. Long: Follow program prescription in THR. Short: Continue exercise prescription Long: Work up to building overall MET level Short: attend consistently Long: continue to improve MET level Short: Start checking HR during exercise at home Long: Exercise independently at home at appropriate prescription Short: Increase T5  Long: Conitnue to improve his stamina    Row Name 03/29/21 0951 04/11/21 1509           Exercise Goal Re-Evaluation   Exercise Goals Review Increase Physical Activity;Increase Strength and Stamina;Understanding of Exercise Prescription Increase Physical Activity;Increase Strength and Stamina      Comments Steve Warren reports walking and weights with some stretching. He will take off on Wednesdays, but he exercises other days when not at rehab. He will usually walk 1 mile with some hills - RPE (12), he uses five pound weights 15 minutes. Steve Warren is progressing welland has increased levels on T4 and to 5 lb for strength work.      Expected Outcomes ST:  continue to improve in rehab LT: continue exercise at home Short: continue to gradually increase workloads Long:  improve average MET level               Discharge Exercise Prescription (Final Exercise Prescription Changes):  Exercise Prescription Changes - 04/11/21 1500       Response to Exercise   Blood Pressure (Admit) 104/54    Blood Pressure (Exit) 110/58    Heart Rate (Admit) 88 bpm    Heart Rate (Exercise) 116 bpm    Heart Rate (Exit) 97 bpm    Rating of Perceived Exertion (Exercise) 13    Symptoms none    Duration Continue with 30 min of aerobic exercise without signs/symptoms of physical distress.    Intensity THRR unchanged      Progression   Progression Continue to progress workloads to maintain intensity without signs/symptoms of physical distress.    Average METs 3.65      Resistance Training   Training Prescription Yes    Weight 5 lb    Reps 10-15      Interval Training   Interval Training No      NuStep   Level 5    Minutes 15      Track   Laps 42    Minutes 15    METs 3.28      Home Exercise Plan   Plans to continue exercise at Home (comment)  walking   Frequency Add 2 additional days to program exercise sessions.    Initial Home Exercises Provided 03/17/21             Nutrition:  Target Goals: Understanding of nutrition guidelines, daily intake of sodium <1566m, cholesterol <2062m calories 30% from fat and 7% or less from saturated fats, daily to have 5 or more servings of fruits and vegetables.  Education: All About Nutrition: -Group instruction provided by verbal, written material, interactive activities, discussions, models, and posters to present general guidelines for heart healthy nutrition including fat, fiber, MyPlate, the role of sodium in heart healthy nutrition, utilization of the nutrition label, and utilization of this knowledge for meal planning. Follow up email sent as well. Written material given at graduation. Flowsheet  [facility]  Education need identified 02/17/21  Date 03/31/21  Educator MCSan ManuelInstruction Review Code 1- Verbalizes Understanding       Biometrics:  Pre Biometrics - 02/17/21 0924       Pre Biometrics   Height 5' 11"  (1.803 m)    Weight 209 lb 4.8 oz (94.9 kg)    BMI (Calculated) 29.2    Single Leg Stand 2.7 seconds              Nutrition Therapy Plan and Nutrition Goals:  Nutrition Therapy & Goals - 03/22/21 0844       Nutrition Therapy   Diet Heart healthy, low Na, CKD stg 3 MNT    Drug/Food Interactions Statins/Certain Fruits    Protein (specify units) 75g    Fiber 30 grams    Whole Grain Foods 3 servings    Saturated Fats 12 max. grams    Fruits and Vegetables 8 servings/day    Sodium 1.5 grams      Personal Nutrition Goals   Nutrition Goal ST: manage sodium between 2g-2.3g LT: maintain heart healthy changes, make sure K+, phos, and Na are withing normal limits.    Comments Patient is a 89.o. M admitted to rehab with primary dx of s/p stent placement. PMH CKD stg 3, MGUS, HLD, HTN, HCL, GERD. Relevant medications include lipitor, probiotic, B-12. PYP: 69 - No results in media yet to see filled out PYP. They limit going out to eat. B: hard boiled egg and whole wheat toast with coffee - black S: coffee - black, sometimes piece of candy L: varies: Bagel with cream cheese, salad, soup, leftover S: apple or piece of fruit if hungry D: Chicken, fish (mostly salmon), [place] protein. Lots of non-starcy vegetables. Wife makes a lot of mediterranian dishes. Olive oil to saute foods, bakes french fries. Will sometimes put spam in salads, but it is rare. Will sometimes do all beef hotdogs, but that is also rare. She will use 1/2 the salt called for, when a recipe calls for it. She uses very little with normal cooking. They get vegetables from [employer] which gives them a variety. She has switched out their ground  beef to ground [place] adds beans and kale to soup. Drinks: no sugar added gaterode for lunch and dinner, water. Wife reports that his Na has been low - suggested keeping Na between [id number] Na gets too high modify to 1500m75miscussed how gatorade will have added Na and potasisum so to be mindful. Discussed heart healthy nutrition as well as CKD stg 3 MNT.      Intervention Plan   Intervention Prescribe, educate  and counsel regarding individualized specific dietary modifications aiming towards targeted core components such as weight, hypertension, lipid management, diabetes, heart failure and other comorbidities.    Expected Outcomes Short Term Goal: Understand basic principles of dietary content, such as calories, fat, sodium, cholesterol and nutrients.;Short Term Goal: A plan has been developed with personal nutrition goals set during dietitian appointment.;Long Term Goal: Adherence to prescribed nutrition plan.             Nutrition Assessments:  MEDIFICTS Score Key: ?70 Need to make dietary changes  40-70 Heart Healthy Diet ? 40 Therapeutic Level Cholesterol Diet  Flowsheet Row Cardiac Rehab from 02/17/2021 in [facility] Cardiac and Pulmonary Rehab  Picture Your Plate Total Score on Admission 69      Picture Your Plate Scores: <04 Unhealthy dietary pattern with much room for improvement. 41-50 Dietary pattern unlikely to meet recommendations for good health and room for improvement. 51-60 More healthful dietary pattern, with some room for improvement.  >60 Healthy dietary pattern, although there may be some specific behaviors that could be improved.    Nutrition Goals Re-Evaluation:   Nutrition Goals Discharge (Final Nutrition Goals Re-Evaluation):   Psychosocial: Target Goals: Acknowledge presence or absence of significant depression and/or stress, maximize coping skills, provide positive support system. Participant is able to verbalize types and ability to use  techniques and skills needed for reducing stress and depression.   Education: Stress, Anxiety, and Depression - Group verbal and visual presentation to define topics covered.  Reviews how body is impacted by stress, anxiety, and depression.  Also discusses healthy ways to reduce stress and to treat/manage anxiety and depression.  Written material given at graduation. Flowsheet Row Cardiac Rehab from 03/31/2021 in [facility] Cardiac and Pulmonary Rehab  Date 02/24/21  Educator [facility]  Instruction Review Code 1- Verbalizes Understanding       Education: Sleep Hygiene -Provides group verbal and written instruction about how sleep can affect your health.  Define sleep hygiene, discuss sleep cycles and impact of sleep habits. Review good sleep hygiene tips.    Initial Review & Psychosocial Screening:  Initial Psych Review & Screening - 02/11/21 1006       Initial Review   Current issues with Current Sleep Concerns;Current Anxiety/Panic      Family Dynamics   Good Support System? Yes   wife     Barriers   Psychosocial barriers to participate in program There are no identifiable barriers or psychosocial needs.;The patient should benefit from training in stress management and relaxation.      Screening Interventions   Interventions Encouraged to exercise;Provide feedback about the scores to participant;To provide support and resources with identified psychosocial needs    Expected Outcomes Short Term goal: Utilizing psychosocial counselor, staff and physician to assist with identification of specific Stressors or current issues interfering with healing process. Setting desired goal for each stressor or current issue identified.;Long Term Goal: Stressors or current issues are controlled or eliminated.;Short Term goal: Identification and review with participant of any Quality of Life or Depression concerns found by scoring the questionnaire.;Long Term goal: The participant improves quality of Life and  PHQ9 Scores as seen by post scores and/or verbalization of changes             Quality of Life Scores:   Quality of Life - 02/17/21 1416       Quality of Life   Select Quality of Life      Quality of Life Scores   Health/Function Pre  26 %    Socioeconomic Pre 23.25 %    Psych/Spiritual Pre 24.93 %    Family Pre 28.8 %    GLOBAL Pre 25.56 %            Scores of 19 and below usually indicate a poorer quality of life in these areas.  A difference of  2-3 points is a clinically meaningful difference.  A difference of 2-3 points in the total score of the Quality of Life Index has been associated with significant improvement in overall quality of life, self-image, physical symptoms, and general health in studies assessing change in quality of life.  PHQ-9: Recent Review Flowsheet Data     Depression screen [facility] 2/9 02/17/2021   Decreased Interest 0   Down, Depressed, Hopeless 0   PHQ - 2 Score 0   Altered sleeping 1   Tired, decreased energy 2   Change in appetite 0   Feeling bad or failure about yourself  0   Trouble concentrating 0   Moving slowly or fidgety/restless 0   Suicidal thoughts 0   PHQ-9 Score 3   Difficult doing work/chores Not difficult at all      Interpretation of Total Score  Total Score Depression Severity:  1-4 = Minimal depression, 5-9 = Mild depression, 10-14 = Moderate depression, 15-19 = Moderately severe depression, 20-27 = Severe depression   Psychosocial Evaluation and Intervention:  Psychosocial Evaluation - 02/11/21 1031       Psychosocial Evaluation & Interventions   Interventions Encouraged to exercise with the program and follow exercise prescription;Stress management education;Relaxation education    Comments 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 He can tell a difference in his stamina as well. He does have some hip pain that hinders him from walking as much as he wishes, but he is ready to get started in the program to help  gain some strength and lose weight. He does state he has anxiety daily but has had it for a long time so he feels like he manages it well. It can present itself in arriving 30 minutes early to an appt in fear of being late, for example. His wife his is main support system and helps him manage his medications. His sleep is distrubed by having to get up and use the bathroom frequently, sometimes its easy to fall back asleep other times it is not. He is hopeful this program will help with his understanding of how to better care for himself.    Expected Outcomes Short: attend cardiac rehab for education and exercise. Long: develop and maintain positive self care habits.    Continue Psychosocial Services  Follow up required by staff             Psychosocial Re-Evaluation:  Psychosocial Re-Evaluation     [place] Name 03/29/21 [phone]             Psychosocial Re-Evaluation   Current issues with Current Anxiety/Panic;Current Sleep Concerns       Comments He has struggled with anxiety his whole life, he has not taken any medication. He will use deep breathing techniques to lower his anxiety. There is nothing specifically he will do to relax or reduce stress, but he reports keeping a positive attitude.  His wife is a good support system for him, she helps to also keep him on track with his diet. He reports not sleeping well - 6 hours a night max, this is due to going to the bathroom  multiple times a night - he has spoken to his doctor regarding this, they do not want to add any more medications.       Expected Outcomes ST: continue to maintain positive attitude, continue deep breathing techniques LT: continue to maintain positive attitude, continue deep breathing techniques       Interventions Encouraged to attend Cardiac Rehabilitation for the exercise       Continue Psychosocial Services  Follow up required by staff                Psychosocial Discharge (Final Psychosocial Re-Evaluation):   Psychosocial Re-Evaluation - 03/29/21 0959       Psychosocial Re-Evaluation   Current issues with Current Anxiety/Panic;Current Sleep Concerns    Comments He has struggled with anxiety his whole life, he has not taken any medication. He will use deep breathing techniques to lower his anxiety. There is nothing specifically he will do to relax or reduce stress, but he reports keeping a positive attitude.  His wife is a good support system for him, she helps to also keep him on track with his diet. He reports not sleeping well - 6 hours a night max, this is due to going to the bathroom multiple times a night - he has spoken to his doctor regarding this, they do not want to add any more medications.    Expected Outcomes ST: continue to maintain positive attitude, continue deep breathing techniques LT: continue to maintain positive attitude, continue deep breathing techniques    Interventions Encouraged to attend Cardiac Rehabilitation for the exercise    Continue Psychosocial Services  Follow up required by staff             Vocational Rehabilitation: Provide vocational rehab assistance to qualifying candidates.   Vocational Rehab Evaluation & Intervention:  Vocational Rehab - 02/11/21 1031       Initial Vocational Rehab Evaluation & Intervention   Assessment shows need for Vocational Rehabilitation No             Education: Education Goals: Education classes will be provided on a variety of topics geared toward better understanding of heart health and risk factor modification. Participant will state understanding/return demonstration of topics presented as noted by education test scores.  Learning Barriers/Preferences:  Learning Barriers/Preferences - 02/11/21 1031       Learning Barriers/Preferences   Learning Barriers None    Learning Preferences None             General Cardiac Education Topics:  AED/CPR: - Group verbal and written instruction with the use of  models to demonstrate the basic use of the AED with the basic ABC's of resuscitation.   Anatomy and Cardiac Procedures: - Group verbal and visual presentation and models provide information about basic cardiac anatomy and function. Reviews the testing methods done to diagnose heart disease and the outcomes of the test results. Describes the treatment choices: Medical Management, Angioplasty, or Coronary Bypass Surgery for treating various heart conditions including Myocardial Infarction, Angina, Valve Disease, and Cardiac Arrhythmias.  Written material given at graduation. Flowsheet Row Cardiac Rehab from 03/31/2021 in [facility] Cardiac and Pulmonary Rehab  Date 03/17/21  Educator sb  Instruction Review Code 1- Verbalizes Understanding       Medication Safety: - Group verbal and visual instruction to review commonly prescribed medications for heart and lung disease. Reviews the medication, class of the drug, and side effects. Includes the steps to properly store meds and maintain the prescription regimen.  Written material given at graduation.   Intimacy: - Group verbal instruction through game format to discuss how heart and lung disease can affect sexual intimacy. Written material given at graduation.. Flowsheet Row Cardiac Rehab from 03/31/2021 in [facility] Cardiac and Pulmonary Rehab  Date 03/10/21  Educator [facility]  Instruction Review Code 1- Verbalizes Understanding       Know Your Numbers and Heart Failure: - Group verbal and visual instruction to discuss disease risk factors for cardiac and pulmonary disease and treatment options.  Reviews associated critical values for Overweight/Obesity, Hypertension, Cholesterol, and Diabetes.  Discusses basics of heart failure: signs/symptoms and treatments.  Introduces Heart Failure Zone chart for action plan for heart failure.  Written material given at graduation.   Infection Prevention: - Provides verbal and written material to individual with  discussion of infection control including proper hand washing and proper equipment cleaning during exercise session. Flowsheet Row Cardiac Rehab from 03/31/2021 in [facility] Cardiac and Pulmonary Rehab  Education need identified 02/17/21  Date 02/17/21  Educator [place]  Instruction Review Code 1- Verbalizes Understanding       Falls Prevention: - Provides verbal and written material to individual with discussion of falls prevention and safety. Flowsheet Row Cardiac Rehab from 03/31/2021 in [facility] Cardiac and Pulmonary Rehab  Education need identified 02/17/21  Date 02/17/21  Educator [place]  Instruction Review Code 1- Verbalizes Understanding       Other: -Provides group and verbal instruction on various topics (see comments)   Knowledge Questionnaire Score:  Knowledge Questionnaire Score - 02/17/21 1416       Knowledge Questionnaire Score   Pre Score 23/26: Angina, Exercise, Nutrition             Core Components/Risk Factors/Patient Goals at Admission:  Personal Goals and Risk Factors at Admission - 02/17/21 1505       Core Components/Risk Factors/Patient Goals on Admission    Weight Management Yes;Weight Loss    Intervention Weight Management: Provide education and appropriate resources to help participant work on and attain dietary goals.;Weight Management: Develop a combined nutrition and exercise program designed to reach desired caloric intake, while maintaining appropriate intake of nutrient and fiber, sodium and fats, and appropriate energy expenditure required for the weight goal.;Weight Management/Obesity: Establish reasonable short term and long term weight goals.    Admit Weight 209 lb (94.8 kg)    Goal Weight: Short Term 205 lb (93 kg)    Goal Weight: Long Term 199 lb (90.3 kg)    Expected Outcomes Short Term: Continue to assess and modify interventions until short term weight is achieved;Long Term: Adherence to nutrition and physical activity/exercise program aimed  toward attainment of established weight goal;Weight Loss: Understanding of general recommendations for a balanced deficit meal plan, which promotes 1-2 lb weight loss per week and includes a negative energy balance of [phone] kcal/d;Understanding recommendations for meals to include 15-35% energy as protein, 25-35% energy from fat, 35-60% energy from carbohydrates, less than 250m of dietary cholesterol, 20-35 gm of total fiber daily;Understanding of distribution of calorie intake throughout the day with the consumption of 4-5 meals/snacks    Hypertension Yes    Intervention Provide education on lifestyle modifcations including regular physical activity/exercise, weight management, moderate sodium restriction and increased consumption of fresh fruit, vegetables, and low fat dairy, alcohol moderation, and smoking cessation.;Monitor prescription use compliance.    Expected Outcomes Short Term: Continued assessment and intervention until BP is < 140/955mHG in hypertensive participants. < 130/8095mG in hypertensive participants  with diabetes, heart failure or chronic kidney disease.;Long Term: Maintenance of blood pressure at goal levels.    Lipids Yes    Intervention Provide education and support for participant on nutrition & aerobic/resistive exercise along with prescribed medications to achieve LDL <22m, HDL >473m    Expected Outcomes Short Term: Participant states understanding of desired cholesterol values and is compliant with medications prescribed. Participant is following exercise prescription and nutrition guidelines.;Long Term: Cholesterol controlled with medications as prescribed, with individualized exercise RX and with personalized nutrition plan. Value goals: LDL < 7039mHDL > 40 mg.             Education:Diabetes - Individual verbal and written instruction to review signs/symptoms of diabetes, desired ranges of glucose level fasting, after meals and with exercise. Acknowledge that  pre and post exercise glucose checks will be done for 3 sessions at entry of program.   Core Components/Risk Factors/Patient Goals Review:   Goals and Risk Factor Review     Row Name 03/29/21 0949             Core Components/Risk Factors/Patient Goals Review   Personal Goals Review Weight Management/Obesity;Lipids;Hypertension       Review He takes his BP twice per day - today 118/70. This weekend he had some 60/40 - he feels like he was dehydrated - he called his doctor who told him to drink water as he was likely dehydrated. Encouraged him even when not thirsty to drink water during the day. He reports his weight has been stable, but has gone down maybe a couple of pounds. He is nervous about the holidays with his weight, encouraged him to still enjoy family and food, but make sure he eats during the day to help prevent eating way past the point of fullness, pay attention to his hunger and fullness cues, make some healthier sides by altering recipes (mashed cauliflower instead of mashed potatoes, [place] ham).       Expected Outcomes ST: continue to check BP regularly, be mindful during the holiday while still enjoying the festivities LT: [place] to monitor risk factors                Core Components/Risk Factors/Patient Goals at Discharge (Final Review):   Goals and Risk Factor Review - 03/29/21 0949       Core Components/Risk Factors/Patient Goals Review   Personal Goals Review Weight Management/Obesity;Lipids;Hypertension    Review He takes his BP twice per day - today 118/70. This weekend he had some 60/40 - he feels like he was dehydrated - he called his doctor who told him to drink water as he was likely dehydrated. Encouraged him even when not thirsty to drink water during the day. He reports his weight has been stable, but has gone down maybe a couple of pounds. He is nervous about the holidays with his weight, encouraged him to still enjoy family and food, but make  sure he eats during the day to help prevent eating way past the point of fullness, pay attention to his hunger and fullness cues, make some healthier sides by altering recipes (mashed cauliflower instead of mashed potatoes, [place] ham).    Expected Outcomes ST: continue to check BP regularly, be mindful during the holiday while still enjoying the festivities LT: Conitnue to monitor risk factors             ITP Comments:  ITP Comments     Row Name 02/11/21 1003 02/17/21 [id number]  02/22/21 0918 03/16/21 0743 03/22/21 0840   ITP Comments Inital telephone orientation completed. Diagnosis can be found in [facility] 8/23. EP orientation scheduled for Thursday 10/6 at 8am. Completed 6MWT and gym orientation. Initial ITP created and sent for review to Dr. Emily Filbert, Medical Director. First full day of exercise!  Patient was oriented to gym and equipment including functions, settings, policies, and procedures.  Patient's individual exercise prescription and treatment plan were reviewed.  All starting workloads were established based on the results of the 6 minute walk test done at initial orientation visit.  The plan for exercise progression was also introduced and progression will be customized based on patient's performance and goals. 30 Day review completed. Medical Director ITP review done, changes made as directed, and signed approval by Medical Director. Completed initial RD consultation    [place] Name 04/13/21 0726           ITP Comments 30 Day review completed. Medical Director ITP review done, changes made as directed, and signed approval by Medical Director.                Comments:

## 2021-04-13 NOTE — Telephone Encounter (Signed)
Patient seen today by Christell Faith, PA. Per Thurmond Butts the patient needs labs (cbc, bmp) this week.  Called the patient and his wife and made them aware of the recommendation. Lab appt scheduled 04/14/21 @ 10:30am.

## 2021-04-13 NOTE — Patient Instructions (Signed)
Medication Instructions:  Your physician has recommended you make the following change in your medication:   STOP Metoprolol  *If you need a refill on your cardiac medications before your next appointment, please call your pharmacy*   Lab Work: None ordered  If you have labs (blood work) drawn today and your tests are completely normal, you will receive your results only by: [place] (if you have MyChart) OR A paper copy in the mail If you have any lab test that is abnormal or we need to change your treatment, we will call you to review the results.   Testing/Procedures: Your physician has recommended that you wear a Zio monitor AT. (To be worn for 14 days)  This monitor is a medical device that records the heart's electrical activity. Doctors most often use these monitors to diagnose arrhythmias. Arrhythmias are problems with the speed or rhythm of the heartbeat. The monitor is a small device applied to your chest. You can wear one while you do your normal daily activities. While wearing this monitor if you have any symptoms to push the button and record what you felt. Once you have worn this monitor for the period of time provider prescribed (Usually 14 days), you will return the monitor device in the postage paid box. Once it is returned they will download the data collected and provide [place] with a report which the provider will then review and we will call you with those results. Important tips:  Avoid showering during the first 24 hours of wearing the monitor. Avoid excessive sweating to help maximize wear time. Do not submerge the device, no hot tubs, and no swimming pools. Keep any lotions or oils away from the patch. After 24 hours you may shower with the patch on. Take brief showers with your back facing the shower head.  Do not remove patch once it has been placed because that will interrupt data and decrease adhesive wear time. Push the button when you have any symptoms  and write down what you were feeling. Once you have completed wearing your monitor, remove and place into box which has postage paid and place in your outgoing mailbox.  If for some reason you have misplaced your box then call our office and we can provide another box and/or mail it off for you.      Follow-Up: At [facility], you and your health needs are our priority.  As part of our continuing mission to provide you with exceptional heart care, we have created designated Provider Care Teams.  These Care Teams include your primary Cardiologist (physician) and Advanced Practice Providers (APPs -  Physician Assistants and Nurse Practitioners) who all work together to provide you with the care you need, when you need it.  We recommend signing up for the patient portal called "MyChart".  Sign up information is provided on this After Visit Summary.  MyChart is used to connect with patients for Virtual Visits (Telemedicine).  Patients are able to view lab/test results, encounter notes, upcoming appointments, etc.  Non-urgent messages can be sent to your provider as well.   To learn more about what you can do with MyChart, go to [email or web address].    Your next appointment:   4-6 week(s)  The format for your next appointment:   In Person  Provider:   You may see Nelva Bush, MD or one of the following Advanced Practice Providers on your designated Care Team:   Murray Hodgkins, NP Christell Faith, PA-C Cadence Kathlen Mody,  PA-C    Other Instructions N/A

## 2021-04-14 ENCOUNTER — Encounter: Payer: Medicare PPO | Attending: Internal Medicine

## 2021-04-14 ENCOUNTER — Other Ambulatory Visit (INDEPENDENT_AMBULATORY_CARE_PROVIDER_SITE_OTHER): Payer: Medicare PPO

## 2021-04-14 DIAGNOSIS — I959 Hypotension, unspecified: Secondary | ICD-10-CM

## 2021-04-14 DIAGNOSIS — Z955 Presence of coronary angioplasty implant and graft: Secondary | ICD-10-CM | POA: Diagnosis not present

## 2021-04-14 NOTE — Progress Notes (Signed)
Daily Session Note  Patient Details  Name: Steve Warren MRN: [id number] Date of Birth: [birth year] Referring Provider:   Flowsheet Row Cardiac Rehab from 02/17/2021 in [facility] Cardiac and Pulmonary Rehab  Referring Provider End, [name]  MD       Encounter Date: 04/14/2021  Check In:  Session Check In - 04/14/21 0914       Check-In   Supervising physician immediately available to respond to emergencies See telemetry face sheet for immediately available ER MD    Location [facility]-Cardiac & Pulmonary Rehab    Staff Present Birdie Sons, MPA, Nino Glow, MS, ASCM CEP, Exercise Physiologist;Amanda Oletta Darter, BA, ACSM CEP, Exercise Physiologist;Saleena Tamas Amedeo Plenty, BS, ACSM CEP, Exercise Physiologist    Virtual Visit No    Medication changes reported     No    Fall or balance concerns reported    No    Tobacco Cessation No Change    Warm-up and Cool-down Performed on first and last piece of equipment    Resistance Training Performed Yes    VAD Patient? No    PAD/SET Patient? No      Pain Assessment   Currently in Pain? No/denies                Social History   Tobacco Use  Smoking Status Former   Types: Cigarettes   Quit date: 2012   Years since quitting: 10.9  Smokeless Tobacco Never    Goals Met:  Independence with exercise equipment Exercise tolerated well No report of concerns or symptoms today Strength training completed today  Goals Unmet:  Not Applicable  Comments: Pt able to follow exercise prescription today without complaint.  Will continue to monitor for progression.    Dr. Emily Filbert is Medical Director for [place].  Dr. Ottie Glazier is Medical Director for [facility] Pulmonary Rehabilitation.

## 2021-04-15 DIAGNOSIS — I959 Hypotension, unspecified: Secondary | ICD-10-CM | POA: Diagnosis not present

## 2021-04-15 DIAGNOSIS — R002 Palpitations: Secondary | ICD-10-CM | POA: Diagnosis not present

## 2021-04-15 DIAGNOSIS — R42 Dizziness and giddiness: Secondary | ICD-10-CM | POA: Diagnosis not present

## 2021-04-15 LAB — CBC WITH DIFFERENTIAL/PLATELET
Basophils Absolute: 0 10*3/uL (ref 0.0–0.2)
Basos: 1 %
EOS (ABSOLUTE): 0.1 10*3/uL (ref 0.0–0.4)
Eos: 2 %
Hematocrit: 43.6 % (ref 37.5–51.0)
Hemoglobin: 14.7 g/dL (ref 13.0–17.7)
Immature Grans (Abs): 0 10*3/uL (ref 0.0–0.1)
Immature Granulocytes: 0 %
Lymphocytes Absolute: 0.8 10*3/uL (ref 0.7–3.1)
Lymphs: 15 %
MCH: 31.1 pg (ref 26.6–33.0)
MCHC: 33.7 g/dL (ref 31.5–35.7)
MCV: 92 fL (ref 79–97)
Monocytes Absolute: 0.8 10*3/uL (ref 0.1–0.9)
Monocytes: 14 %
Neutrophils Absolute: 4 10*3/uL (ref 1.4–7.0)
Neutrophils: 68 %
Platelets: 189 10*3/uL (ref 150–450)
RBC: 4.72 x10E6/uL (ref 4.14–5.80)
RDW: 12.8 % (ref 11.6–15.4)
WBC: 5.8 10*3/uL (ref 3.4–10.8)

## 2021-04-15 LAB — BASIC METABOLIC PANEL
BUN/Creatinine Ratio: 20 (ref 10–24)
BUN: 24 mg/dL (ref 8–27)
CO2: 21 mmol/L (ref 20–29)
Calcium: 9.1 mg/dL (ref 8.6–10.2)
Chloride: 99 mmol/L (ref 96–106)
Creatinine, Ser: 1.22 mg/dL (ref 0.76–1.27)
Glucose: 85 mg/dL (ref 70–99)
Potassium: 4.9 mmol/L (ref 3.5–5.2)
Sodium: 132 mmol/L — ABNORMAL LOW (ref 134–144)
eGFR: 61 mL/min/{1.73_m2} (ref >59)

## 2021-04-19 ENCOUNTER — Other Ambulatory Visit: Payer: Self-pay

## 2021-04-19 ENCOUNTER — Encounter: Payer: Medicare PPO | Admitting: *Deleted

## 2021-04-19 DIAGNOSIS — Z955 Presence of coronary angioplasty implant and graft: Secondary | ICD-10-CM

## 2021-04-19 NOTE — Progress Notes (Signed)
Daily Session Note  Patient Details  Name: ICKER SWIGERT MRN: [id number] Date of Birth: 1945-11-28 Referring Provider:   Flowsheet Row Cardiac Rehab from 02/17/2021 in [facility] Cardiac and Pulmonary Rehab  Referring Provider End, [name]  MD       Encounter Date: 04/19/2021  Check In:  Session Check In - 04/19/21 1001       Check-In   Supervising physician immediately available to respond to emergencies See telemetry face sheet for immediately available ER MD    Location [facility]-Cardiac & Pulmonary Rehab    Staff Present Nyoka Cowden, RN, BSN, Willette Pa, MA, RCEP, CCRP, [place], [place], ACSM CEP, Exercise Physiologist    Virtual Visit No    Fall or balance concerns reported    No    Tobacco Cessation No Change    Warm-up and Cool-down Performed on first and last piece of equipment    Resistance Training Performed Yes    VAD Patient? No    PAD/SET Patient? No      Pain Assessment   Currently in Pain? No/denies                Social History   Tobacco Use  Smoking Status Former   Types: Cigarettes   Quit date: 2012   Years since quitting: 10.9  Smokeless Tobacco Never    Goals Met:  Independence with exercise equipment Exercise tolerated well No report of concerns or symptoms today  Goals Unmet:  Not Applicable  Comments: Pt able to follow exercise prescription today without complaint.  Will continue to monitor for progression.    Dr. Emily Filbert is Medical Director for [place].  Dr. Ottie Glazier is Medical Director for [facility] Pulmonary Rehabilitation.

## 2021-04-21 ENCOUNTER — Other Ambulatory Visit: Payer: Self-pay

## 2021-04-21 DIAGNOSIS — Z955 Presence of coronary angioplasty implant and graft: Secondary | ICD-10-CM | POA: Diagnosis not present

## 2021-04-21 NOTE — Progress Notes (Signed)
Daily Session Note  Patient Details  Name: LERONE ONDER MRN: [id number] Date of Birth: [birth year] Referring Provider:   Flowsheet Row Cardiac Rehab from 02/17/2021 in [facility] Cardiac and Pulmonary Rehab  Referring Provider End, [name]  MD       Encounter Date: 04/21/2021  Check In:  Session Check In - 04/21/21 0911       Check-In   Supervising physician immediately available to respond to emergencies See telemetry face sheet for immediately available ER MD    Location [facility]-Cardiac & Pulmonary Rehab    Staff Present Birdie Sons, MPA, Elveria Rising, BA, ACSM CEP, Exercise Physiologist;Taggert Bozzi Amedeo Plenty, BS, ACSM CEP, Exercise Physiologist    Virtual Visit No    Medication changes reported     No    Fall or balance concerns reported    No    Tobacco Cessation No Change    Warm-up and Cool-down Performed on first and last piece of equipment    Resistance Training Performed Yes    VAD Patient? No    PAD/SET Patient? No      Pain Assessment   Currently in Pain? No/denies                Social History   Tobacco Use  Smoking Status Former   Types: Cigarettes   Quit date: 2012   Years since quitting: 10.9  Smokeless Tobacco Never    Goals Met:  Independence with exercise equipment Exercise tolerated well No report of concerns or symptoms today Strength training completed today  Goals Unmet:  Not Applicable  Comments: Pt able to follow exercise prescription today without complaint.  Will continue to monitor for progression.    Dr. Emily Filbert is Medical Director for [place].  Dr. Ottie Glazier is Medical Director for [facility] Pulmonary Rehabilitation.

## 2021-04-26 ENCOUNTER — Other Ambulatory Visit: Payer: Self-pay

## 2021-04-26 DIAGNOSIS — Z955 Presence of coronary angioplasty implant and graft: Secondary | ICD-10-CM | POA: Diagnosis not present

## 2021-04-26 NOTE — Progress Notes (Signed)
Daily Session Note  Patient Details  Name: Steve Warren MRN: [id number] Date of Birth: [birth year] Referring Provider:   Flowsheet Row Cardiac Rehab from 02/17/2021 in [facility] Cardiac and Pulmonary Rehab  Referring Provider End, [name]  MD       Encounter Date: 04/26/2021  Check In:  Session Check In - 04/26/21 0912       Check-In   Supervising physician immediately available to respond to emergencies See telemetry face sheet for immediately available ER MD    Location [facility]-Cardiac & Pulmonary Rehab    Staff Present Birdie Sons, MPA, RN;Amanda Oletta Darter, BA, ACSM CEP, Exercise Physiologist;Jessica Luan Pulling, MA, RCEP, CCRP, CCET    Virtual Visit No    Medication changes reported     No    Fall or balance concerns reported    No    Tobacco Cessation No Change    Warm-up and Cool-down Performed on first and last piece of equipment    Resistance Training Performed Yes    VAD Patient? No    PAD/SET Patient? No      Pain Assessment   Currently in Pain? No/denies                Social History   Tobacco Use  Smoking Status Former   Types: Cigarettes   Quit date: 2012   Years since quitting: 10.9  Smokeless Tobacco Never    Goals Met:  Independence with exercise equipment Exercise tolerated well No report of concerns or symptoms today Strength training completed today  Goals Unmet:  Not Applicable  Comments: Pt able to follow exercise prescription today without complaint.  Will continue to monitor for progression.    Dr. Emily Filbert is Medical Director for [place].  Dr. Ottie Glazier is Medical Director for [facility] Pulmonary Rehabilitation.

## 2021-04-28 ENCOUNTER — Other Ambulatory Visit: Payer: Self-pay

## 2021-04-28 VITALS — Ht 71.0 in | Wt 213.4 lb

## 2021-04-28 DIAGNOSIS — Z955 Presence of coronary angioplasty implant and graft: Secondary | ICD-10-CM | POA: Diagnosis not present

## 2021-04-28 NOTE — Patient Instructions (Signed)
Discharge Patient Instructions  Patient Details  Name: Steve Warren MRN: [id number] Date of Birth: [birth year] Referring Provider:  Nelva Bush, MD   Number of Visits: 26  Reason for Discharge:  Patient reached a stable level of exercise. Patient independent in their exercise. Patient has met program and personal goals.  Smoking History:  Social History   Tobacco Use  Smoking Status Former   Types: Cigarettes   Quit date: 2012   Years since quitting: 10.9  Smokeless Tobacco Never    Diagnosis:  Status post coronary artery stent placement  Initial Exercise Prescription:  Initial Exercise Prescription - 02/17/21 1500       Date of Initial Exercise RX and Referring Provider   Date 02/17/21    Referring Provider End, [name]  MD      Oxygen   Maintain Oxygen Saturation 88% or higher      NuStep   Level 3    SPM 80    Minutes 15    METs 2.9      T5 Nustep   Level 2    SPM 80    Minutes 15    METs 2.9      Track   Laps 35    Minutes 15    METs 2.9      Prescription Details   Frequency (times per week) 2    Duration Progress to 30 minutes of continuous aerobic without signs/symptoms of physical distress      Intensity   THRR 40-80% of Max Heartrate 100-129    Ratings of Perceived Exertion 11-13    Perceived Dyspnea 0-4      Progression   Progression Continue to progress workloads to maintain intensity without signs/symptoms of physical distress.      Resistance Training   Training Prescription Yes    Weight 3 lb    Reps 10-15             Discharge Exercise Prescription (Final Exercise Prescription Changes):  Exercise Prescription Changes - 04/25/21 1000       Response to Exercise   Blood Pressure (Admit) 120/60    Blood Pressure (Exit) 124/72    Heart Rate (Admit) 84 bpm    Heart Rate (Exercise) 129 bpm    Heart Rate (Exit) 107 bpm    Rating of Perceived Exertion (Exercise) 13    Symptoms none    Duration Continue with 30  min of aerobic exercise without signs/symptoms of physical distress.    Intensity THRR unchanged      Progression   Progression Continue to progress workloads to maintain intensity without signs/symptoms of physical distress.    Average METs 3.45      Resistance Training   Training Prescription Yes    Weight 5 lb    Reps 10-15      Interval Training   Interval Training No      NuStep   Level 6    Minutes 15    METs 3.4      Arm Ergometer   Level 1    Minutes 15    METs 2.2      Track   Laps 46    Minutes 15    METs 3.5      Home Exercise Plan   Plans to continue exercise at Home (comment)   walking   Frequency Add 2 additional days to program exercise sessions.    Initial Home Exercises Provided 03/17/21      Oxygen  Maintain Oxygen Saturation 88% or higher             Functional Capacity:  6 Minute Walk     Row Name 02/17/21 1452 04/28/21 0937       6 Minute Walk   Phase Initial Discharge    Distance 1545 feet 1860 feet    Distance % Change -- 20.3 %    Distance Feet Change -- 315 ft    Walk Time 6 minutes 6 minutes    # of Rest Breaks 0 0    MPH 2.92 3.52    METS 2.9 3.5    RPE 12 13    Perceived Dyspnea  0 1    VO2 Peak 10.15 12.25    Symptoms Yes (comment) No    Comments Bilateral hip pain 2/10 --    Resting HR 72 bpm 86 bpm    Resting BP 110/64 132/82    Resting Oxygen Saturation  97 % --    Exercise Oxygen Saturation  during 6 min walk 95 % --    Max Ex. HR 109 bpm 110 bpm    Max Ex. BP 114/58 124/64    2 Minute Post BP 106/64 --              Nutrition & Weight - Outcomes:  Pre Biometrics - 02/17/21 0924       Pre Biometrics   Height 5' 11"  (1.803 m)    Weight 209 lb 4.8 oz (94.9 kg)    BMI (Calculated) 29.2    Single Leg Stand 2.7 seconds             Post Biometrics - 04/28/21 1005        Post  Biometrics   Height 5' 11"  (1.803 m)    Weight 213 lb 6.4 oz (96.8 kg)    BMI (Calculated) 29.78              Nutrition:  Nutrition Therapy & Goals - 03/22/21 0844       Nutrition Therapy   Diet Heart healthy, low Na, CKD stg 3 MNT    Drug/Food Interactions Statins/Certain Fruits    Protein (specify units) 75g    Fiber 30 grams    Whole Grain Foods 3 servings    Saturated Fats 12 max. grams    Fruits and Vegetables 8 servings/day    Sodium 1.5 grams      Personal Nutrition Goals   Nutrition Goal ST: manage sodium between 2g-2.3g LT: maintain heart healthy changes, make sure K+, phos, and Na are withing normal limits.    Comments Patient is a 75 y.o. M admitted to rehab with primary dx of s/p stent placement. PMH CKD stg 3, MGUS, HLD, HTN, HCL, GERD. Relevant medications include lipitor, probiotic, B-12. PYP: 69 - No results in media yet to see filled out PYP. They limit going out to eat. B: hard boiled egg and whole wheat toast with coffee - black S: coffee - black, sometimes piece of candy L: varies: Bagel with cream cheese, salad, soup, leftover S: apple or piece of fruit if hungry D: Chicken, fish (mostly salmon), [place] for protein. Lots of non-starcy vegetables. Wife makes a lot of mediterranian dishes. Olive oil to saute foods, bakes french fries. Will sometimes put spam in salads, but it is rare. Will sometimes do all beef hotdogs, but that is also rare. She will use 1/2 the salt called for, when a recipe calls for it. She uses very  little with normal cooking. They get vegetables from [employer] which gives them a variety. She has switched out their ground beef to ground [place] and adds beans and kale to soup. Drinks: no sugar added gaterode for lunch and dinner, water. Wife reports that his Na has been low - suggested keeping Na between 2000-2389m; if Na gets too high modify to 15040m Discussed how gatorade will have added Na and potasisum so to be mindful. Discussed heart healthy nutrition as well as CKD stg 3 MNT.      Intervention Plan   Intervention Prescribe, educate and counsel  regarding individualized specific dietary modifications aiming towards targeted core components such as weight, hypertension, lipid management, diabetes, heart failure and other comorbidities.    Expected Outcomes Short Term Goal: Understand basic principles of dietary content, such as calories, fat, sodium, cholesterol and nutrients.;Short Term Goal: A plan has been developed with personal nutrition goals set during dietitian appointment.;Long Term Goal: Adherence to prescribed nutrition plan.              Goals reviewed with patient; copy given to patient.

## 2021-04-28 NOTE — Progress Notes (Signed)
Daily Session Note  Patient Details  Name: Steve Warren MRN: [id number] Date of Birth: 1945/10/06 Referring Provider:   Flowsheet Row Cardiac Rehab from 02/17/2021 in [facility] Cardiac and Pulmonary Rehab  Referring Provider End, [name]  MD       Encounter Date: 04/28/2021  Check In:  Session Check In - 04/28/21 0913       Check-In   Supervising physician immediately available to respond to emergencies See telemetry face sheet for immediately available ER MD    Location [facility]-Cardiac & Pulmonary Rehab    Staff Present Birdie Sons, MPA, Elveria Rising, BA, ACSM CEP, Exercise Physiologist;Roel Douthat Amedeo Plenty, BS, ACSM CEP, Exercise Physiologist    Virtual Visit No    Medication changes reported     No    Fall or balance concerns reported    No    Tobacco Cessation No Change    Warm-up and Cool-down Performed on first and last piece of equipment    Resistance Training Performed Yes    VAD Patient? No    PAD/SET Patient? No      Pain Assessment   Currently in Pain? No/denies                Social History   Tobacco Use  Smoking Status Former   Types: Cigarettes   Quit date: 2012   Years since quitting: 10.9  Smokeless Tobacco Never    Goals Met:  Independence with exercise equipment Exercise tolerated well No report of concerns or symptoms today Strength training completed today  Goals Unmet:  Not Applicable  Comments: Pt able to follow exercise prescription today without complaint.  Will continue to monitor for progression.    Dr. Emily Filbert is Medical Director for [place].  Dr. Ottie Glazier is Medical Director for [facility] Pulmonary Rehabilitation.

## 2021-05-03 ENCOUNTER — Other Ambulatory Visit: Payer: Self-pay

## 2021-05-03 DIAGNOSIS — Z955 Presence of coronary angioplasty implant and graft: Secondary | ICD-10-CM | POA: Diagnosis not present

## 2021-05-03 NOTE — Progress Notes (Signed)
Daily Session Note  Patient Details  Name: Steve Warren MRN: [id number] Date of Birth: 07/29/1945 Referring Provider:   Flowsheet Row Cardiac Rehab from 02/17/2021 in [facility] Cardiac and Pulmonary Rehab  Referring Provider End, [name]  MD       Encounter Date: 05/03/2021  Check In:  Session Check In - 05/03/21 0924       Check-In   Supervising physician immediately available to respond to emergencies See telemetry face sheet for immediately available ER MD    Location [facility]-Cardiac & Pulmonary Rehab    Staff Present Birdie Sons, MPA, RN;Amanda Oletta Darter, BA, ACSM CEP, Exercise Physiologist;Jessica Luan Pulling, MA, RCEP, CCRP, CCET    Virtual Visit No    Medication changes reported     No    Fall or balance concerns reported    No    Tobacco Cessation No Change    Warm-up and Cool-down Performed on first and last piece of equipment    Resistance Training Performed Yes    VAD Patient? No    PAD/SET Patient? No      Pain Assessment   Currently in Pain? No/denies                Social History   Tobacco Use  Smoking Status Former   Types: Cigarettes   Quit date: 2012   Years since quitting: 10.9  Smokeless Tobacco Never    Goals Met:  Independence with exercise equipment Exercise tolerated well No report of concerns or symptoms today Strength training completed today  Goals Unmet:  Not Applicable  Comments: Pt able to follow exercise prescription today without complaint.  Will continue to monitor for progression.    Dr. Emily Filbert is Medical Director for [place].  Dr. Ottie Glazier is Medical Director for [facility] Pulmonary Rehabilitation.

## 2021-05-05 ENCOUNTER — Telehealth: Payer: Self-pay

## 2021-05-05 ENCOUNTER — Other Ambulatory Visit: Payer: Self-pay

## 2021-05-05 DIAGNOSIS — Z955 Presence of coronary angioplasty implant and graft: Secondary | ICD-10-CM

## 2021-05-05 NOTE — Progress Notes (Signed)
Cardiac Individual Treatment Plan  Patient Details  Name: Steve Warren MRN: [id number] Date of Birth: 1945-11-28 Referring Provider:   Flowsheet Row Cardiac Rehab from 02/17/2021 in [facility] Cardiac and Pulmonary Rehab  Referring Provider End, [name]  MD       Initial Encounter Date:  Flowsheet Row Cardiac Rehab from 02/17/2021 in [facility] Cardiac and Pulmonary Rehab  Date 02/17/21       Visit Diagnosis: Status post coronary artery stent placement  Patient's Home Medications on Admission:  Current Outpatient Medications:    aspirin EC 81 MG EC tablet, Take 1 tablet (81 mg total) by mouth daily. Swallow whole., Disp: 30 tablet, Rfl: 0   atorvastatin (LIPITOR) 40 MG tablet, Take 1 tablet (40 mg total) by mouth daily., Disp: 90 tablet, Rfl: 3   Bacillus Coagulans-Inulin (PROBIOTIC) 1-250 BILLION-MG CAPS, Take 1 capsule by mouth daily., Disp: , Rfl:    clopidogrel (PLAVIX) 75 MG tablet, Take 1 tablet (75 mg total) by mouth daily., Disp: 30 tablet, Rfl: 11   Cyanocobalamin (VITAMIN B 12 PO), Take 1,000 mcg by mouth daily at 12 noon., Disp: , Rfl:    erythromycin ophthalmic ointment, Place 1 application into both eyes at bedtime., Disp: , Rfl:    primidone (MYSOLINE) 50 MG tablet, Take 50 mg by mouth at bedtime., Disp: , Rfl:    Propylene Glycol (SYSTANE COMPLETE OP), Place 1 drop into both eyes in the morning, at noon, and at bedtime., Disp: , Rfl:    tamsulosin (FLOMAX) 0.4 MG CAPS capsule, Take 0.4 mg by mouth daily after supper., Disp: , Rfl:   Past Medical History: Past Medical History:  Diagnosis Date   Anxiety    BPH (benign prostatic hyperplasia)    CAD in native artery    Cataract    bilateral- Dec 2021 and jan 2022   GERD (gastroesophageal reflux disease)    Hypercholesterolemia    Hyperlipidemia    Hypertension     Tobacco Use: Social History   Tobacco Use  Smoking Status Former   Types: Cigarettes   Quit date: 2012   Years since quitting: 10.9  Smokeless  Tobacco Never    Labs: Recent Review Management consultant for [facility] Cardiac and Pulmonary Rehab Latest Ref Rng & Units 11/16/2020 01/27/2021   Cholestrol 0 - 200 mg/dL 128 116   LDLCALC 0 - 99 mg/dL 77 60   HDL >40 mg/dL 40(L) 36(L)   Trlycerides <150 mg/dL 55 99        Exercise Target Goals: Exercise Program Goal: Individual exercise prescription set using results from initial 6 min walk test and THRR while considering  patients activity barriers and safety.   Exercise Prescription Goal: Initial exercise prescription builds to 30-45 minutes a day of aerobic activity, 2-3 days per week.  Home exercise guidelines will be given to patient during program as part of exercise prescription that the participant will acknowledge.   Education: Aerobic Exercise: - Group verbal and visual presentation on the components of exercise prescription. Introduces F.I.T.T principle from ACSM for exercise prescriptions.  Reviews F.I.T.T. principles of aerobic exercise including progression. Written material given at graduation. Flowsheet Row Cardiac Rehab from 03/31/2021 in [facility] Cardiac and Pulmonary Rehab  Education need identified 02/17/21  Date 03/10/21  Educator [facility]  Instruction Review Code 1- Verbalizes Understanding       Education: Resistance Exercise: - Group verbal and visual presentation on the components of exercise prescription. Introduces F.I.T.T principle from ACSM for  exercise prescriptions  Reviews F.I.T.T. principles of resistance exercise including progression. Written material given at graduation. Flowsheet Row Cardiac Rehab from 03/31/2021 in [facility] Cardiac and Pulmonary Rehab  Date 03/17/21  Educator as  Instruction Review Code 1- Verbalizes Understanding        Education: Exercise & Equipment Safety: - Individual verbal instruction and demonstration of equipment use and safety with use of the equipment. Flowsheet Row Cardiac Rehab from 03/31/2021 in [facility] Cardiac and  Pulmonary Rehab  Education need identified 02/17/21  Date 02/17/21  Educator [place]  Instruction Review Code 1- Verbalizes Understanding       Education: Exercise Physiology & General Exercise Guidelines: - Group verbal and written instruction with models to review the exercise physiology of the cardiovascular system and associated critical values. Provides general exercise guidelines with specific guidelines to those with heart or lung disease.  Flowsheet Row Cardiac Rehab from 03/31/2021 in [facility] Cardiac and Pulmonary Rehab  Education need identified 02/17/21  Date 03/03/21  Educator [facility]  Instruction Review Code 1- [employer] Understanding       Education: Flexibility, Balance, Mind/Body Relaxation: - Group verbal and visual presentation with interactive activity on the components of exercise prescription. Introduces F.I.T.T principle from ACSM for exercise prescriptions. Reviews F.I.T.T. principles of flexibility and balance exercise training including progression. Also discusses the mind body connection.  Reviews various relaxation techniques to help reduce and manage stress (i.e. Deep breathing, progressive muscle relaxation, and visualization). Balance handout provided to take home. Written material given at graduation. Flowsheet Row Cardiac Rehab from 03/31/2021 in [facility] Cardiac and Pulmonary Rehab  Date 03/24/21  Educator [place]  Instruction Review Code 1- Verbalizes Understanding       Activity Barriers & Risk Stratification:  Activity Barriers & Cardiac Risk Stratification - 02/17/21 1453       Activity Barriers & Cardiac Risk Stratification   Activity Barriers Back Problems;Joint Problems;Deconditioning;Muscular Weakness             6 Minute Walk:  6 Minute Walk     Row Name 02/17/21 1452 04/28/21 0937       6 Minute Walk   Phase Initial Discharge    Distance 1545 feet 1860 feet    Distance % Change -- 20.3 %    Distance Feet Change -- 315 ft    Walk Time 6  minutes 6 minutes    # of Rest Breaks 0 0    MPH 2.92 3.52    METS 2.9 3.5    RPE 12 13    Perceived Dyspnea  0 1    VO2 Peak 10.15 12.25    Symptoms Yes (comment) No    Comments Bilateral hip pain 2/10 --    Resting HR 72 bpm 86 bpm    Resting BP 110/64 132/82    Resting Oxygen Saturation  97 % --    Exercise Oxygen Saturation  during 6 min walk 95 % --    Max Ex. HR 109 bpm 110 bpm    Max Ex. BP 114/58 124/64    2 Minute Post BP 106/64 --             Oxygen Initial Assessment:   Oxygen Re-Evaluation:   Oxygen Discharge (Final Oxygen Re-Evaluation):   Initial Exercise Prescription:  Initial Exercise Prescription - 02/17/21 1500       Date of Initial Exercise RX and Referring Provider   Date 02/17/21    Referring Provider End, [name]  MD  Oxygen   Maintain Oxygen Saturation 88% or higher      NuStep   Level 3    SPM 80    Minutes 15    METs 2.9      T5 Nustep   Level 2    SPM 80    Minutes 15    METs 2.9      Track   Laps 35    Minutes 15    METs 2.9      Prescription Details   Frequency (times per week) 2    Duration Progress to 30 minutes of continuous aerobic without signs/symptoms of physical distress      Intensity   THRR 40-80% of Max Heartrate 100-129    Ratings of Perceived Exertion 11-13    Perceived Dyspnea 0-4      Progression   Progression Continue to progress workloads to maintain intensity without signs/symptoms of physical distress.      Resistance Training   Training Prescription Yes    Weight 3 lb    Reps 10-15             Perform Capillary Blood Glucose checks as needed.  Exercise Prescription Changes:   Exercise Prescription Changes     Row Name 02/17/21 1500 02/28/21 1000 03/14/21 1600 03/17/21 0900 03/28/21 1400     Response to Exercise   Blood Pressure (Admit) 110/64 132/76 130/70 -- 128/62   Blood Pressure (Exercise) 114/58 136/64 130/70 -- 130/64   Blood Pressure (Exit) 106/64 122/64 112/62  -- 132/62   Heart Rate (Admit) 72 bpm 73 bpm 79 bpm -- 72 bpm   Heart Rate (Exercise) 109 bpm 114 bpm 119 bpm -- 111 bpm   Heart Rate (Exit) 77 bpm 100 bpm 100 bpm -- 102 bpm   Oxygen Saturation (Admit) 97 % -- -- -- --   Oxygen Saturation (Exercise) 95 % -- -- -- --   Oxygen Saturation (Exit) 97 % -- -- -- --   Rating of Perceived Exertion (Exercise) 12 12 13  -- 13   Perceived Dyspnea (Exercise) 0 -- -- -- --   Symptoms Bilateral hip pain 2/10 none none -- none   Comments walk test results 1st full day of exercise -- -- --   Duration -- Continue with 30 min of aerobic exercise without signs/symptoms of physical distress. Continue with 30 min of aerobic exercise without signs/symptoms of physical distress. -- Continue with 30 min of aerobic exercise without signs/symptoms of physical distress.   Intensity -- THRR unchanged THRR unchanged -- THRR unchanged     Progression   Progression -- Continue to progress workloads to maintain intensity without signs/symptoms of physical distress. Continue to progress workloads to maintain intensity without signs/symptoms of physical distress. -- Continue to progress workloads to maintain intensity without signs/symptoms of physical distress.   Average METs -- 2.51 3 -- 3.17     Resistance Training   Training Prescription -- Yes Yes -- Yes   Weight -- 3 lb 3 lb -- 4 lb   Reps -- 10-15 10-15 -- 10-15     Interval Training   Interval Training -- No No -- No     NuStep   Level -- 3 -- -- 3   Minutes -- 15 -- -- 15   METs -- 2.3 -- -- 3.5     T5 Nustep   Level -- 2 2 -- 2   Minutes -- 15 15 -- 15   METs -- 2.5 3.2 --  3.1     Track   Laps -- 30 35 -- 35   Minutes -- 15 15 -- 15   METs -- 2.63 2.9 -- 2.9     Home Exercise Plan   Plans to continue exercise at -- -- -- Home (comment)  walking Home (comment)  walking   Frequency -- -- -- Add 2 additional days to program exercise sessions. Add 2 additional days to program exercise sessions.    Initial Home Exercises Provided -- -- -- 03/17/21 03/17/21    Row Name 04/11/21 1500 04/25/21 1000           Response to Exercise   Blood Pressure (Admit) 104/54 120/60      Blood Pressure (Exit) 110/58 124/72      Heart Rate (Admit) 88 bpm 84 bpm      Heart Rate (Exercise) 116 bpm 129 bpm      Heart Rate (Exit) 97 bpm 107 bpm      Rating of Perceived Exertion (Exercise) 13 13      Symptoms none none      Duration Continue with 30 min of aerobic exercise without signs/symptoms of physical distress. Continue with 30 min of aerobic exercise without signs/symptoms of physical distress.      Intensity THRR unchanged THRR unchanged        Progression   Progression Continue to progress workloads to maintain intensity without signs/symptoms of physical distress. Continue to progress workloads to maintain intensity without signs/symptoms of physical distress.      Average METs 3.65 3.45        Resistance Training   Training Prescription Yes Yes      Weight 5 lb 5 lb      Reps 10-15 10-15        Interval Training   Interval Training No No        NuStep   Level 5 6      Minutes 15 15      METs -- 3.4        Arm Ergometer   Level -- 1      Minutes -- 15      METs -- 2.2        Track   Laps 42 46      Minutes 15 15      METs 3.28 3.5        Home Exercise Plan   Plans to continue exercise at Home (comment)  walking Home (comment)  walking      Frequency Add 2 additional days to program exercise sessions. Add 2 additional days to program exercise sessions.      Initial Home Exercises Provided 03/17/21 03/17/21        Oxygen   Maintain Oxygen Saturation -- 88% or higher               Exercise Comments:   Exercise Comments     Row Name 02/22/21 9604 05/05/21 0926         Exercise Comments First full day of exercise!  Patient was oriented to gym and equipment including functions, settings, policies, and procedures.  Patient's individual exercise prescription and  treatment plan were reviewed.  All starting workloads were established based on the results of the 6 minute walk test done at initial orientation visit.  The plan for exercise progression was also introduced and progression will be customized based on patient's performance and goals. Berman graduated today from  rehab with 20 sessions completed.  Details of the patient's exercise prescription and what He needs to do in order to continue the prescription and progress were discussed with patient.  Patient was given a copy of prescription and goals.  Patient verbalized understanding.  Nycere plans to continue to exercise by walking at home.               Exercise Goals and Review:   Exercise Goals     Row Name 02/17/21 1505             Exercise Goals   Increase Physical Activity Yes       Intervention Provide advice, education, support and counseling about physical activity/exercise needs.;Develop an individualized exercise prescription for aerobic and resistive training based on initial evaluation findings, risk stratification, comorbidities and participant's personal goals.       Expected Outcomes Short Term: Attend rehab on a regular basis to increase amount of physical activity.;Long Term: Add in home exercise to make exercise part of routine and to increase amount of physical activity.;Long Term: Exercising regularly at least 3-5 days a week.       Increase Strength and Stamina Yes       Intervention Provide advice, education, support and counseling about physical activity/exercise needs.;Develop an individualized exercise prescription for aerobic and resistive training based on initial evaluation findings, risk stratification, comorbidities and participant's personal goals.       Expected Outcomes Short Term: Increase workloads from initial exercise prescription for resistance, speed, and METs.;Short Term: Perform resistance training exercises routinely during rehab and add in resistance  training at home;Long Term: Improve cardiorespiratory fitness, muscular endurance and strength as measured by increased METs and functional capacity (6MWT)       Able to understand and use rate of perceived exertion (RPE) scale Yes       Intervention Provide education and explanation on how to use RPE scale       Expected Outcomes Short Term: Able to use RPE daily in rehab to express subjective intensity level;Long Term:  Able to use RPE to guide intensity level when exercising independently       Able to understand and use Dyspnea scale Yes       Intervention Provide education and explanation on how to use Dyspnea scale       Expected Outcomes Short Term: Able to use Dyspnea scale daily in rehab to express subjective sense of shortness of breath during exertion;Long Term: Able to use Dyspnea scale to guide intensity level when exercising independently       Knowledge and understanding of Target Heart Rate Range (THRR) Yes       Intervention Provide education and explanation of THRR including how the numbers were predicted and where they are located for reference       Expected Outcomes Short Term: Able to state/look up THRR;Long Term: Able to use THRR to govern intensity when exercising independently;Short Term: Able to use daily as guideline for intensity in rehab       Able to check pulse independently Yes       Intervention Provide education and demonstration on how to check pulse in carotid and radial arteries.;Review the importance of being able to check your own pulse for safety during independent exercise       Expected Outcomes Short Term: Able to explain why pulse checking is important during independent exercise;Long Term: Able to check pulse independently and accurately       Understanding of Exercise Prescription Yes  Intervention Provide education, explanation, and written materials on patient's individual exercise prescription       Expected Outcomes Short Term: Able to explain  program exercise prescription;Long Term: Able to explain home exercise prescription to exercise independently                Exercise Goals Re-Evaluation :  Exercise Goals Re-Evaluation     Row Name 02/22/21 0300 02/28/21 1014 03/14/21 1615 03/17/21 0936 03/28/21 1408     Exercise Goal Re-Evaluation   Exercise Goals Review Increase Physical Activity;Able to understand and use rate of perceived exertion (RPE) scale;Knowledge and understanding of Target Heart Rate Range (THRR);Understanding of Exercise Prescription;Increase Strength and Stamina;Able to check pulse independently;Able to understand and use Dyspnea scale Increase Physical Activity;Increase Strength and Stamina Increase Physical Activity;Increase Strength and Stamina Increase Physical Activity;Increase Strength and Stamina;Understanding of Exercise Prescription Increase Physical Activity;Increase Strength and Stamina;Understanding of Exercise Prescription   Comments Reviewed RPE and dyspnea scales, THR and program prescription with pt today.  Pt voiced understanding and was given a copy of goals to take home. Steve Warren is doing well the first couple of sessions that he has been here for rehab. So far he is working within appropriate RPEs and will continue to monitor progression as he progresses throughout the program. Steve Warren is progressing well and has increased average MET level and is up to 35 laps on the track.  We will continue to monitor progress. Reviewed home exercise with pt today.  Pt plans to walk and do weights for exercise. Him and his wife are thinking of joining the [employer] down the road. Reviewed THR, pulse, RPE, sign and symptoms, pulse oximetery and when to call 911 or MD.  Also discussed weather considerations and indoor options.  Pt voiced understanding. Steve Warren has done well in rehab.  He is up to 35 laps on the track and likes to walk first now!  We will continue to monitor his progress.   Expected Outcomes Short:  Use RPE daily to regulate intensity. Long: Follow program prescription in THR. Short: Continue exercise prescription Long: Work up to building overall MET level Short: attend consistently Long: continue to improve MET level Short: Start checking HR during exercise at home Long: Exercise independently at home at appropriate prescription Short: Increase T5  Long: Conitnue to improve his stamina    Row Name 03/29/21 [place] 04/11/21 1509 04/25/21 1039 04/26/21 0929       Exercise Goal Re-Evaluation   Exercise Goals Review Increase Physical Activity;Increase Strength and Stamina;Understanding of Exercise Prescription Increase Physical Activity;Increase Strength and Stamina Increase Physical Activity;Increase Strength and Stamina Increase Physical Activity;Increase Strength and Stamina;Understanding of Exercise Prescription    Comments Steve Warren reports walking and weights with some stretching. He will take off on Wednesdays, but he exercises other days when not at rehab. He will usually walk 1 mile with some hills - RPE (12), he uses five pound weights 15 minutes. Steve Warren is progressing welland has increased levels on T4 and to 5 lb for strength work. Steve Warren is doing well in rehab. He has increased to level 6 on the T4 Nustep and recently tried the arm ergometer and tolerated it well. He consistently hits his [place] when walking on the track. Will continue to monitor. Steve Warren has done well in rehab.  He should improve his post 6MWT next visit as he nears graduation.  He is walking with his wife on off days for about a mile each time. He can tell that  he has gotten a lot stronger and has more stamina than when he started the program.    Expected Outcomes ST: continue to improve in rehab LT: continue exercise at home Short: continue to gradually increase workloads Long:  improve average MET level Short: Work up load on arm ergometer as tolerated Long: Continue to increase overall strength and stamina Short: Improve post 6MWT  Long: Continue to exercise independently             Discharge Exercise Prescription (Final Exercise Prescription Changes):  Exercise Prescription Changes - 04/25/21 1000       Response to Exercise   Blood Pressure (Admit) 120/60    Blood Pressure (Exit) 124/72    Heart Rate (Admit) 84 bpm    Heart Rate (Exercise) 129 bpm    Heart Rate (Exit) 107 bpm    Rating of Perceived Exertion (Exercise) 13    Symptoms none    Duration Continue with 30 min of aerobic exercise without signs/symptoms of physical distress.    Intensity THRR unchanged      Progression   Progression Continue to progress workloads to maintain intensity without signs/symptoms of physical distress.    Average METs 3.45      Resistance Training   Training Prescription Yes    Weight 5 lb    Reps 10-15      Interval Training   Interval Training No      NuStep   Level 6    Minutes 15    METs 3.4      Arm Ergometer   Level 1    Minutes 15    METs 2.2      Track   Laps 46    Minutes 15    METs 3.5      Home Exercise Plan   Plans to continue exercise at Home (comment)   walking   Frequency Add 2 additional days to program exercise sessions.    Initial Home Exercises Provided 03/17/21      Oxygen   Maintain Oxygen Saturation 88% or higher             Nutrition:  Target Goals: Understanding of nutrition guidelines, daily intake of sodium <1546m, cholesterol <2071m calories 30% from fat and 7% or less from saturated fats, daily to have 5 or more servings of fruits and vegetables.  Education: All About Nutrition: -Group instruction provided by verbal, written material, interactive activities, discussions, models, and posters to present general guidelines for heart healthy nutrition including fat, fiber, MyPlate, the role of sodium in heart healthy nutrition, utilization of the nutrition label, and utilization of this knowledge for meal planning. Follow up email sent as well. Written material  given at graduation. Flowsheet [facility]  Education need identified 02/17/21  Date 03/31/21  Educator MCAlvoInstruction Review Code 1- Verbalizes Understanding       Biometrics:  Pre Biometrics - 02/17/21 0924       Pre Biometrics   Height 5' 11"  (1.803 m)    Weight 209 lb 4.8 oz (94.9 kg)    BMI (Calculated) 29.2    Single Leg Stand 2.7 seconds             Post Biometrics - 04/28/21 1005        Post  Biometrics   Height 5' 11"  (1.803 m)    Weight 213 lb 6.4 oz (96.8 kg)    BMI (Calculated) 29.78  Nutrition Therapy Plan and Nutrition Goals:  Nutrition Therapy & Goals - 03/22/21 0844       Nutrition Therapy   Diet Heart healthy, low Na, CKD stg 3 MNT    Drug/Food Interactions Statins/Certain Fruits    Protein (specify units) 75g    Fiber 30 grams    Whole Grain Foods 3 servings    Saturated Fats 12 max. grams    Fruits and Vegetables 8 servings/day    Sodium 1.5 grams      Personal Nutrition Goals   Nutrition Goal ST: manage sodium between 2g-2.3g LT: maintain heart healthy changes, make sure K+, phos, and Na are withing normal limits.    Comments Patient is a 75 y.o. M admitted to rehab with primary dx of s/p stent placement. PMH CKD stg 3, MGUS, HLD, HTN, HCL, GERD. Relevant medications include lipitor, probiotic, B-12. PYP: 69 - No results in media yet to see filled out PYP. They limit going out to eat. B: hard boiled egg and whole wheat toast with coffee - black S: coffee - black, sometimes piece of candy L: varies: Bagel with cream cheese, salad, soup, leftover S: apple or piece of fruit if hungry D: Chicken, fish (mostly salmon), [place] for protein. Lots of non-starcy vegetables. Wife makes a lot of mediterranian dishes. Olive oil to saute foods, bakes french fries. Will sometimes put spam in salads, but it is rare. Will sometimes do all beef hotdogs, but that is also rare. She will use  1/2 the salt called for, when a recipe calls for it. She uses very little with normal cooking. They get vegetables from [employer] which gives them a variety. She has switched out their ground beef to ground [place] and adds beans and kale to soup. Drinks: no sugar added gaterode for lunch and dinner, water. Wife reports that his Na has been low - suggested keeping Na between 2000-23110m; if Na gets too high modify to 15095m Discussed how gatorade will have added Na and potasisum so to be mindful. Discussed heart healthy nutrition as well as CKD stg 3 MNT.      Intervention Plan   Intervention Prescribe, educate and counsel regarding individualized specific dietary modifications aiming towards targeted core components such as weight, hypertension, lipid management, diabetes, heart failure and other comorbidities.    Expected Outcomes Short Term Goal: Understand basic principles of dietary content, such as calories, fat, sodium, cholesterol and nutrients.;Short Term Goal: A plan has been developed with personal nutrition goals set during dietitian appointment.;Long Term Goal: Adherence to prescribed nutrition plan.             Nutrition Assessments:  MEDIFICTS Score Key: ?70 Need to make dietary changes  40-70 Heart Healthy Diet ? 40 Therapeutic Level Cholesterol Diet  [facility]  Picture Your Plate Total Score on Admission 69  Picture Your Plate Total Score on Discharge 80      Picture Your Plate Scores: <4<17nhealthy dietary pattern with much room for improvement. 41-50 Dietary pattern unlikely to meet recommendations for good health and room for improvement. 51-60 More healthful dietary pattern, with some room for improvement.  >60 Healthy dietary pattern, although there may be some specific behaviors that could be improved.    Nutrition Goals Re-Evaluation:  Nutrition Goals Re-Evaluation     [place]  04/26/21 0926             Goals   Nutrition  Goal ST: manage sodium between 2g-2.3g LT: maintain heart healthy changes, make sure K+, phos, and Na are withing normal limits.       Comment Steve Warren is doing well with his diet.  They were always diligent on fruits, vegetables and fats.  They don't eat much red meat.  He has been watching his sodium more closely and working to balance diet with heart healthy and kidney friendly.       Expected Outcome Continue to montior sodium and aim for balance                Nutrition Goals Discharge (Final Nutrition Goals Re-Evaluation):  Nutrition Goals Re-Evaluation - 04/26/21 0926       Goals   Nutrition Goal ST: manage sodium between 2g-2.3g LT: maintain heart healthy changes, make sure K+, phos, and Na are withing normal limits.    Comment Steve Warren is doing well with his diet.  They were always diligent on fruits, vegetables and fats.  They don't eat much red meat.  He has been watching his sodium more closely and working to balance diet with heart healthy and kidney friendly.    Expected Outcome Continue to montior sodium and aim for balance             Psychosocial: Target Goals: Acknowledge presence or absence of significant depression and/or stress, maximize coping skills, provide positive support system. Participant is able to verbalize types and ability to use techniques and skills needed for reducing stress and depression.   Education: Stress, Anxiety, and Depression - Group verbal and visual presentation to define topics covered.  Reviews how body is impacted by stress, anxiety, and depression.  Also discusses healthy ways to reduce stress and to treat/manage anxiety and depression.  Written material given at graduation. Flowsheet Row Cardiac Rehab from 03/31/2021 in [facility] Cardiac and Pulmonary Rehab  Date 02/24/21  Educator [facility]  Instruction Review Code 1- Verbalizes Understanding       Education: Sleep Hygiene -Provides group  verbal and written instruction about how sleep can affect your health.  Define sleep hygiene, discuss sleep cycles and impact of sleep habits. Review good sleep hygiene tips.    Initial Review & Psychosocial Screening:  Initial Psych Review & Screening - 02/11/21 1006       Initial Review   Current issues with Current Sleep Concerns;Current Anxiety/Panic      Family Dynamics   Good Support System? Yes   wife     Barriers   Psychosocial barriers to participate in program There are no identifiable barriers or psychosocial needs.;The patient should benefit from training in stress management and relaxation.      Screening Interventions   Interventions Encouraged to exercise;Provide feedback about the scores to participant;To provide support and resources with identified psychosocial needs    Expected Outcomes Short Term goal: Utilizing psychosocial counselor, staff and physician to assist with identification of specific Stressors or current issues interfering with healing process. Setting desired goal for each stressor or current issue identified.;Long Term Goal: Stressors or current issues are controlled or eliminated.;Short Term goal: Identification and review with participant of any Quality of Life or Depression concerns found by scoring the questionnaire.;Long Term goal: The participant improves quality of Life and PHQ9 Scores as seen by post scores and/or verbalization of changes             Quality of Life Scores:   Quality of Life - 04/28/21 0933       Quality of  Life Scores   Health/Function Pre 26 %    Health/Function Post 22.33 %    Health/Function % Change -14.12 %    Socioeconomic Pre 23.25 %    Socioeconomic Post 28.75 %    Socioeconomic % Change  23.66 %    Psych/Spiritual Pre 24.93 %    Psych/Spiritual Post 24.5 %    Psych/Spiritual % Change -1.72 %    Family Pre 28.8 %    Family Post 26.4 %    Family % Change -8.33 %    GLOBAL Pre 25.56 %    GLOBAL Post 24.81 %     GLOBAL % Change -2.93 %            Scores of 19 and below usually indicate a poorer quality of life in these areas.  A difference of  2-3 points is a clinically meaningful difference.  A difference of 2-3 points in the total score of the Quality of Life Index has been associated with significant improvement in overall quality of life, self-image, physical symptoms, and general health in studies assessing change in quality of life.  PHQ-9: Recent Review Flowsheet Data     Depression screen [facility] 2/9 04/28/2021 02/17/2021   Decreased Interest 0 0   Down, Depressed, Hopeless 0 0   PHQ - 2 Score 0 0   Altered sleeping 2 1   Tired, decreased energy 1 2   Change in appetite 1 0   Feeling bad or failure about yourself  0 0   Trouble concentrating 0 0   Moving slowly or fidgety/restless 0 0   Suicidal thoughts 0 0   PHQ-9 Score 4 3   Difficult doing work/chores Not difficult at all Not difficult at all      Interpretation of Total Score  Total Score Depression Severity:  1-4 = Minimal depression, 5-9 = Mild depression, 10-14 = Moderate depression, 15-19 = Moderately severe depression, 20-27 = Severe depression   Psychosocial Evaluation and Intervention:  Psychosocial Evaluation - 02/11/21 1031       Psychosocial Evaluation & Interventions   Interventions Encouraged to exercise with the program and follow exercise prescription;Stress management education;Relaxation education    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 He can tell a difference in his stamina as well. He does have some hip pain that hinders him from walking as much as he wishes, but he is ready to get started in the program to help gain some strength and lose weight. He does state he has anxiety daily but has had it for a long time so he feels like he manages it well. It can present itself in arriving 30 minutes early to an appt in fear of being late, for example. His wife his is main support system and  helps him manage his medications. His sleep is distrubed by having to get up and use the bathroom frequently, sometimes its easy to fall back asleep other times it is not. He is hopeful this program will help with his understanding of how to better care for himself.    Expected Outcomes Short: attend cardiac rehab for education and exercise. Long: develop and maintain positive self care habits.    Continue Psychosocial Services  Follow up required by staff             Psychosocial Re-Evaluation:  Psychosocial Re-Evaluation     Steve Warren Name 03/29/21 0959 04/26/21 0928           Psychosocial Re-Evaluation  Current issues with Current Anxiety/Panic;Current Sleep Concerns Current Stress Concerns      Comments He has struggled with anxiety his whole life, he has not taken any medication. He will use deep breathing techniques to lower his anxiety. There is nothing specifically he will do to relax or reduce stress, but he reports keeping a positive attitude.  His wife is a good support system for him, she helps to also keep him on track with his diet. He reports not sleeping well - 6 hours a night max, this is due to going to the bathroom multiple times a night - he has spoken to his doctor regarding this, they do not want to add any more medications. Steve Warren is getting ready to graduate.  He has enjoyed the program and has done well.  He feels better getting back into a routine of exercise again.  He has been a little stressed recently with his wife being sick and then both of them having lots of appointments the last two weeks.      Expected Outcomes ST: continue to maintain positive attitude, continue deep breathing techniques LT: continue to maintain positive attitude, continue deep breathing techniques Continue to exercise for mental boost and stay positive.      Interventions Encouraged to attend Cardiac Rehabilitation for the exercise Encouraged to attend Cardiac Rehabilitation for the exercise       Continue Psychosocial Services  Follow up required by staff --               Psychosocial Discharge (Final Psychosocial Re-Evaluation):  Psychosocial Re-Evaluation - 04/26/21 0928       Psychosocial Re-Evaluation   Current issues with Current Stress Concerns    Comments Steve Warren is getting ready to graduate.  He has enjoyed the program and has done well.  He feels better getting back into a routine of exercise again.  He has been a little stressed recently with his wife being sick and then both of them having lots of appointments the last two weeks.    Expected Outcomes Continue to exercise for mental boost and stay positive.    Interventions Encouraged to attend Cardiac Rehabilitation for the exercise             Vocational Rehabilitation: Provide vocational rehab assistance to qualifying candidates.   Vocational Rehab Evaluation & Intervention:  Vocational Rehab - 02/11/21 1031       Initial Vocational Rehab Evaluation & Intervention   Assessment shows need for Vocational Rehabilitation No             Education: Education Goals: Education classes will be provided on a variety of topics geared toward better understanding of heart health and risk factor modification. Participant will state understanding/return demonstration of topics presented as noted by education test scores.  Learning Barriers/Preferences:  Learning Barriers/Preferences - 02/11/21 1031       Learning Barriers/Preferences   Learning Barriers None    Learning Preferences None             General Cardiac Education Topics:  AED/CPR: - Group verbal and written instruction with the use of models to demonstrate the basic use of the AED with the basic ABC's of resuscitation.   Anatomy and Cardiac Procedures: - Group verbal and visual presentation and models provide information about basic cardiac anatomy and function. Reviews the testing methods done to diagnose heart disease and the  outcomes of the test results. Describes the treatment choices: Medical Management, Angioplasty, or Coronary Bypass Surgery  for treating various heart conditions including Myocardial Infarction, Angina, Valve Disease, and Cardiac Arrhythmias.  Written material given at graduation. Flowsheet Row Cardiac Rehab from 03/31/2021 in [facility] Cardiac and Pulmonary Rehab  Date 03/17/21  Educator sb  Instruction Review Code 1- Verbalizes Understanding       Medication Safety: - Group verbal and visual instruction to review commonly prescribed medications for heart and lung disease. Reviews the medication, class of the drug, and side effects. Includes the steps to properly store meds and maintain the prescription regimen.  Written material given at graduation.   Intimacy: - Group verbal instruction through game format to discuss how heart and lung disease can affect sexual intimacy. Written material given at graduation.. Flowsheet Row Cardiac Rehab from 03/31/2021 in [facility] Cardiac and Pulmonary Rehab  Date 03/10/21  Educator [facility]  Instruction Review Code 1- Verbalizes Understanding       Know Your Numbers and Heart Failure: - Group verbal and visual instruction to discuss disease risk factors for cardiac and pulmonary disease and treatment options.  Reviews associated critical values for Overweight/Obesity, Hypertension, Cholesterol, and Diabetes.  Discusses basics of heart failure: signs/symptoms and treatments.  Introduces Heart Failure Zone chart for action plan for heart failure.  Written material given at graduation.   Infection Prevention: - Provides verbal and written material to individual with discussion of infection control including proper hand washing and proper equipment cleaning during exercise session. Flowsheet Row Cardiac Rehab from 03/31/2021 in [facility] Cardiac and Pulmonary Rehab  Education need identified 02/17/21  Date 02/17/21  Educator [place]  Instruction Review Code 1- Verbalizes  Understanding       Falls Prevention: - Provides verbal and written material to individual with discussion of falls prevention and safety. Flowsheet Row Cardiac Rehab from 03/31/2021 in [facility] Cardiac and Pulmonary Rehab  Education need identified 02/17/21  Date 02/17/21  Educator [employer]  Instruction Review Code 1- Verbalizes Understanding       Other: -Provides group and verbal instruction on various topics (see comments)   Knowledge Questionnaire Score:  Knowledge Questionnaire Score - 04/28/21 0933       Knowledge Questionnaire Score   Pre Score 23/26: Angina, Exercise, Nutrition    Post Score 26/26             Core Components/Risk Factors/Patient Goals at Admission:  Personal Goals and Risk Factors at Admission - 02/17/21 1505       Core Components/Risk Factors/Patient Goals on Admission    Weight Management Yes;Weight Loss    Intervention Weight Management: Provide education and appropriate resources to help participant work on and attain dietary goals.;Weight Management: Develop a combined nutrition and exercise program designed to reach desired caloric intake, while maintaining appropriate intake of nutrient and fiber, sodium and fats, and appropriate energy expenditure required for the weight goal.;Weight Management/Obesity: Establish reasonable short term and long term weight goals.    Admit Weight 209 lb (94.8 kg)    Goal Weight: Short Term 205 lb (93 kg)    Goal Weight: Long Term 199 lb (90.3 kg)    Expected Outcomes Short Term: Continue to assess and modify interventions until short term weight is achieved;Long Term: Adherence to nutrition and physical activity/exercise program aimed toward attainment of established weight goal;Weight Loss: Understanding of general recommendations for a balanced deficit meal plan, which promotes 1-2 lb weight loss per week and includes a negative energy balance of [phone] kcal/d;Understanding recommendations for meals to include  15-35% energy as protein, 25-35% energy from fat,  35-60% energy from carbohydrates, less than 259m of dietary cholesterol, 20-35 gm of total fiber daily;Understanding of distribution of calorie intake throughout the day with the consumption of 4-5 meals/snacks    Hypertension Yes    Intervention Provide education on lifestyle modifcations including regular physical activity/exercise, weight management, moderate sodium restriction and increased consumption of fresh fruit, vegetables, and low fat dairy, alcohol moderation, and smoking cessation.;Monitor prescription use compliance.    Expected Outcomes Short Term: Continued assessment and intervention until BP is < 140/96mHG in hypertensive participants. < 130/8059mG in hypertensive participants with diabetes, heart failure or chronic kidney disease.;Long Term: Maintenance of blood pressure at goal levels.    Lipids Yes    Intervention Provide education and support for participant on nutrition & aerobic/resistive exercise along with prescribed medications to achieve LDL <34m18mDL >40mg73m Expected Outcomes Short Term: Participant states understanding of desired cholesterol values and is compliant with medications prescribed. Participant is following exercise prescription and nutrition guidelines.;Long Term: Cholesterol controlled with medications as prescribed, with individualized exercise RX and with personalized nutrition plan. Value goals: LDL < 34mg,74m > 40 mg.             Education:Diabetes - Individual verbal and written instruction to review signs/symptoms of diabetes, desired ranges of glucose level fasting, after meals and with exercise. Acknowledge that pre and post exercise glucose checks will be done for 3 sessions at entry of program.   Core Components/Risk Factors/Patient Goals Review:   Goals and Risk Factor Review     Row Name 03/29/21 0949 04/26/21 0925           Core Components/Risk Factors/Patient Goals Review    Personal Goals Review Weight Management/Obesity;Lipids;Hypertension Weight Management/Obesity;Lipids;Hypertension      Review He takes his BP twice per day - today 118/70. This weekend he had some 60/40 - he feels like he was dehydrated - he called his doctor who told him to drink water as he was likely dehydrated. Encouraged him even when not thirsty to drink water during the day. He reports his weight has been stable, but has gone down maybe a couple of pounds. He is nervous about the holidays with his weight, encouraged him to still enjoy family and food, but make sure he eats during the day to help prevent eating way past the point of fullness, pay attention to his hunger and fullness cues, make some healthier sides by altering recipes (mashed cauliflower instead of mashed potatoes, turkey[place] of ham). Steve Warren [facility] [place] well in rehab.  His blood pressures have been good and he continues to check them at home.  He is wearing a holter to track pressures still.  Today he was feeling a little lightheaded but pressures were good.  His weight is holding steady and he would like to lose more.  Overall, he is doing well.      Expected Outcomes ST: continue to check BP regularly, be mindful during the holiday while still enjoying the festivities LT: [place]itn[place] to monitor risk factors Continue to monitor risk factors               Core Components/Risk Factors/Patient Goals at Discharge (Final Review):   Goals and Risk Factor Review - 04/26/21 0925       Core Components/Risk Factors/Patient Goals Review   Personal Goals Review Weight Management/Obesity;Lipids;Hypertension    Review Steve Warren Steve [place] well in rehab.  His blood pressures have been good and he continues to check  them at home.  He is wearing a holter to track pressures still.  Today he was feeling a little lightheaded but pressures were good.  His weight is holding steady and he would like to lose more.  Overall, he is doing well.    Expected  Outcomes Continue to monitor risk factors             ITP Comments:  ITP Comments     Row Name 02/11/21 1003 02/17/21 0917 02/22/21 0918 03/16/21 0743 03/22/21 0840   ITP Comments Inital telephone orientation completed. Diagnosis can be found in [facility] 8/23. EP orientation scheduled for Thursday 10/6 at 8am. Completed 6MWT and gym orientation. Initial ITP created and sent for review to Dr. Emily Filbert, Medical Director. First full day of exercise!  Patient was oriented to gym and equipment including functions, settings, policies, and procedures.  Patient's individual exercise prescription and treatment plan were reviewed.  All starting workloads were established based on the results of the 6 minute walk test done at initial orientation visit.  The plan for exercise progression was also introduced and progression will be customized based on patient's performance and goals. 30 Day review completed. Medical Director ITP review done, changes made as directed, and signed approval by Medical Director. Completed initial RD consultation    [place] Name 04/13/21 0726 05/05/21 0926         ITP Comments 30 Day review completed. Medical Director ITP review done, changes made as directed, and signed approval by Medical Director. Farron graduated today from  rehab with 20 sessions completed.  Details of the patient's exercise prescription and what He needs to do in order to continue the prescription and progress were discussed with patient.  Patient was given a copy of prescription and goals.  Patient verbalized understanding.  Mar plans to continue to exercise by walking at home.               Comments: discharge ITP

## 2021-05-05 NOTE — Progress Notes (Signed)
Discharge Progress Report  Steve Warren Details  Name: Steve Warren MRN: [id number] Date of Birth: 1945-09-08 Referring Provider:   Flowsheet Row Cardiac Rehab from 02/17/2021 in [facility]hab  Referring Provider End, [name]  MD        Number of Visits: 20  Reason for Discharge:  Steve Warren reached a stable level of exercise. Steve Warren independent in their exercise. Steve Warren has met program and personal goals.  Smoking History:  Social History   Tobacco Use  Smoking Status Former   Types: Cigarettes   Quit date: 2012   Years since quitting: 10.9  Smokeless Tobacco Never    Diagnosis:  Status post coronary artery stent placement  ADL UCSD:   Initial Exercise Prescription:  Initial Exercise Prescription - 02/17/21 1500       Date of Initial Exercise RX and Referring Provider   Date 02/17/21    Referring Provider End, [name]  MD      Oxygen   Maintain Oxygen Saturation 88% or higher      NuStep   Level 3    SPM 80    Minutes 15    METs 2.9      T5 Nustep   Level 2    SPM 80    Minutes 15    METs 2.9      Track   Laps 35    Minutes 15    METs 2.9      Prescription Details   Frequency (times per week) 2    Duration Progress to 30 minutes of continuous aerobic without signs/symptoms of physical distress      Intensity   THRR 40-80% of Max Heartrate 100-129    Ratings of Perceived Exertion 11-13    Perceived Dyspnea 0-4      Progression   Progression Continue to progress workloads to maintain intensity without signs/symptoms of physical distress.      Resistance Training   Training Prescription Yes    Weight 3 lb    Reps 10-15             Discharge Exercise Prescription (Final Exercise Prescription Changes):  Exercise Prescription Changes - 04/25/21 1000       Response to Exercise   Blood Pressure (Admit) 120/60    Blood Pressure (Exit) 124/72    Heart Rate (Admit) 84 bpm    Heart Rate (Exercise) 129 bpm     Heart Rate (Exit) 107 bpm    Rating of Perceived Exertion (Exercise) 13    Symptoms none    Duration Continue with 30 min of aerobic exercise without signs/symptoms of physical distress.    Intensity THRR unchanged      Progression   Progression Continue to progress workloads to maintain intensity without signs/symptoms of physical distress.    Average METs 3.45      Resistance Training   Training Prescription Yes    Weight 5 lb    Reps 10-15      Interval Training   Interval Training No      NuStep   Level 6    Minutes 15    METs 3.4      Arm Ergometer   Level 1    Minutes 15    METs 2.2      Track   Laps 46    Minutes 15    METs 3.5      Home Exercise Plan   Plans to continue exercise at Home (comment)   walking  Frequency Add 2 additional days to program exercise sessions.    Initial Home Exercises Provided 03/17/21      Oxygen   Maintain Oxygen Saturation 88% or higher             Functional Capacity:  6 Minute Walk     Row Name 02/17/21 1452 04/28/21 0937       6 Minute Walk   Phase Initial Discharge    Distance 1545 feet 1860 feet    Distance % Change -- 20.3 %    Distance Feet Change -- 315 ft    Walk Time 6 minutes 6 minutes    # of Rest Breaks 0 0    MPH 2.92 3.52    METS 2.9 3.5    RPE 12 13    Perceived Dyspnea  0 1    VO2 Peak 10.15 12.25    Symptoms Yes (comment) No    Comments Bilateral hip pain 2/10 --    Resting HR 72 bpm 86 bpm    Resting BP 110/64 132/82    Resting Oxygen Saturation  97 % --    Exercise Oxygen Saturation  during 6 min walk 95 % --    Max Ex. HR 109 bpm 110 bpm    Max Ex. BP 114/58 124/64    2 Minute Post BP 106/64 --             Psychological, QOL, Others - Outcomes: PHQ 2/9: Depression screen [facility] 2/9 04/28/2021 02/17/2021  Decreased Interest 0 0  Down, Depressed, Hopeless 0 0  PHQ - 2 Score 0 0  Altered sleeping 2 1  Tired, decreased energy 1 2  Change in appetite 1 0  Feeling bad or failure  about yourself  0 0  Trouble concentrating 0 0  Moving slowly or fidgety/restless 0 0  Suicidal thoughts 0 0  PHQ-9 Score 4 3  Difficult doing work/chores Not difficult at all Not difficult at all    Quality of Life:  Quality of Life - 04/28/21 0933       Quality of Life Scores   Health/Function Pre 26 %    Health/Function Post 22.33 %    Health/Function % Change -14.12 %    Socioeconomic Pre 23.25 %    Socioeconomic Post 28.75 %    Socioeconomic % Change  23.66 %    Psych/Spiritual Pre 24.93 %    Psych/Spiritual Post 24.5 %    Psych/Spiritual % Change -1.72 %    Family Pre 28.8 %    Family Post 26.4 %    Family % Change -8.33 %    GLOBAL Pre 25.56 %    GLOBAL Post 24.81 %    GLOBAL % Change -2.93 %                Nutrition & Weight - Outcomes:  Pre Biometrics - 02/17/21 0924       Pre Biometrics   Height 5' 11"  (1.803 m)    Weight 209 lb 4.8 oz (94.9 kg)    BMI (Calculated) 29.2    Single Leg Stand 2.7 seconds             Post Biometrics - 04/28/21 1005        Post  Biometrics   Height 5' 11"  (1.803 m)    Weight 213 lb 6.4 oz (96.8 kg)    BMI (Calculated) 29.78             Nutrition:  Nutrition Therapy & Goals - 03/22/21 0844       Nutrition Therapy   Diet Heart healthy, low Na, CKD stg 3 MNT    Drug/Food Interactions Statins/Certain Fruits    Protein (specify units) 75g    Fiber 30 grams    Whole Grain Foods 3 servings    Saturated Fats 12 max. grams    Fruits and Vegetables 8 servings/day    Sodium 1.5 grams      Personal Nutrition Goals   Nutrition Goal ST: manage sodium between 2g-2.3g LT: maintain heart healthy changes, make sure K+, phos, and Na are withing normal limits.    Comments Steve Warren is a 75 y.o. M admitted to rehab with primary dx of s/p stent placement. PMH CKD stg 3, MGUS, HLD, HTN, HCL, GERD. Relevant medications include lipitor, probiotic, B-12. PYP: 69 - No results in media yet to see filled out PYP. They limit  going out to eat. B: hard boiled egg and whole wheat toast with coffee - black S: coffee - black, sometimes piece of candy L: varies: Bagel with cream cheese, salad, soup, leftover S: apple or piece of fruit if hungry D: Chicken, fish (mostly salmon), [place] for protein. Lots of non-starcy vegetables. Wife makes a lot of mediterranian dishes. Olive oil to saute foods, bakes french fries. Will sometimes put spam in salads, but it is rare. Will sometimes do all beef hotdogs, but that is also rare. She will use 1/2 the salt called for, when a recipe calls for it. She uses very little with normal cooking. They get vegetables from [employer] which gives them a variety. She has switched out their ground beef to ground [place] and adds beans and kale to soup. Drinks: no sugar added gaterode for lunch and dinner, water. Wife reports that his Na has been low - suggested keeping Na between 2000-2340m; if Na gets too high modify to 15028m Discussed how gatorade will have added Na and potasisum so to be mindful. Discussed heart healthy nutrition as well as CKD stg 3 MNT.      Intervention Plan   Intervention Prescribe, educate and counsel regarding individualized specific dietary modifications aiming towards targeted core components such as weight, hypertension, lipid management, diabetes, heart failure and other comorbidities.    Expected Outcomes Short Term Goal: Understand basic principles of dietary content, such as calories, fat, sodium, cholesterol and nutrients.;Short Term Goal: A plan has been developed with personal nutrition goals set during dietitian appointment.;Long Term Goal: Adherence to prescribed nutrition plan.             Nutrition Discharge:   Education Questionnaire Score:  Knowledge Questionnaire Score - 04/28/21 0933       Knowledge Questionnaire Score   Pre Score 23/26: Angina, Exercise, Nutrition    Post Score 26/26             Goals reviewed with Steve Warren; copy given  to Steve Warren.

## 2021-05-05 NOTE — Telephone Encounter (Signed)
Spoke with pt wife  aware of the new appt. location and time

## 2021-05-05 NOTE — Progress Notes (Signed)
Daily Session Note  Patient Details  Name: Steve Warren MRN: [id number] Date of Birth: 05-27-1945 Referring Provider:   Flowsheet Row Cardiac Rehab from 02/17/2021 in [facility] Cardiac [facility] Pulmonary Rehab  Referring Provider End, [name]  MD       Encounter Date: 05/05/2021  Check In:  Session Check In - 05/05/21 0923       Check-In   Supervising physician immediately available to respond to emergencies See telemetry face sheet for immediately available ER MD    Location [facility]-Cardiac & Pulmonary Rehab    Staff Present Birdie Sons, MPA, Elveria Rising, BA, ACSM CEP, Exercise Physiologist;Amsi Grimley Amedeo Plenty, BS, ACSM CEP, Exercise Physiologist    Virtual Visit No    Medication changes reported     No    Fall or balance concerns reported    No    Tobacco Cessation No Change    Warm-up and Cool-down Performed on first and last piece of equipment    Resistance Training Performed Yes    VAD Patient? No    PAD/SET Patient? No      Pain Assessment   Currently in Pain? No/denies                Social History   Tobacco Use  Smoking Status Former   Types: Cigarettes   Quit date: 2012   Years since quitting: 10.9  Smokeless Tobacco Never    Goals Met:  Independence with exercise equipment Exercise tolerated well No report of concerns or symptoms today Strength training completed today  Goals Unmet:  Not Applicable  Comments:  Coleby graduated today from  rehab with 20 sessions completed.  Details of the patient's exercise prescription and what He needs to do in order to continue the prescription and progress were discussed with patient.  Patient was given a copy of prescription and goals.  Patient verbalized understanding.  Steve Warren plans to continue to exercise by walking at home.    Dr. Emily Filbert is Medical Director for [place].  Dr. Ottie Glazier is Medical Director for [facility] Pulmonary Rehabilitation.

## 2021-05-11 ENCOUNTER — Encounter: Payer: Self-pay | Admitting: *Deleted

## 2021-05-19 ENCOUNTER — Telehealth: Payer: Self-pay | Admitting: *Deleted

## 2021-05-19 NOTE — Telephone Encounter (Signed)
I called the patient's house today to talk about his appointment coming soon on February 2.  The patient wanted me to talk to his wife about the appointments.  On the phone with his wife and told her that the bone survey does not require an appointment so when they come over on February 2 and they can go ahead and [facility] and get the bone survey at that time.  Wife understands that she knows where the medical mall and how to get that done.  Also wife states that he also has a lab appointment with Dr. Holley Raring on February 6 and wanted to know if all the labs could be drawn on February 2 and then we faxed the results to Dr. Holley Raring.  I told her that I would call their office and I got a message for a voicemail on [facility] who takes care of Dr. Holley Raring orders and asked her to let us know if we can get the orders of labs so that we can get them done here but I need to know what the labs are and then check with Dr. Janese Banks and ask her if it is okay.  Told the wife that I will get back to her as soon as I see what kind of labs that he is going to need from Dr. Holley Raring office.  Wife is agreeable to this plan.

## 2021-05-25 NOTE — Progress Notes (Signed)
Cardiology Office Note    Date:  05/27/2021   ID:  Mana, Morison 1945/07/09, MRN [id number]  PCP:  Sofie Hartigan, MD  Cardiologist:  Nelva Bush, MD  Electrophysiologist:  None   Chief Complaint: Follow up  History of Present Illness:   Steve Warren is a 76 y.o. male with history of CAD status post PCI to the RCA on 01/04/2021, aortic atherosclerosis, systolic dysfunction, pSVT, CKD stage IIIa, MGUS, essential tremor, chronic dizziness, HLD, BPH, and GERD who presents for follow up of Zio patch.   He was admitted to [facility] in 11/2020 with a 4-day history of intermittent chest discomfort described as a sharp sensation in the center of his chest that radiated to his back and seemed to be associated primarily with activity, though also wondered if eating precipitated it.  There was some shortness of breath when the pain occurred.  High-sensitivity troponin peaked at 86.  BNP 112.  EKG demonstrated sinus rhythm with no acute ischemic ST-T changes. Chest x-ray showed low lung volumes without acute abnormality.  CTA chest/abdomen/pelvis showed no acute vascular abnormality with aortic atherosclerosis as well as at least two-vessel coronary artery calcifications, stable pulmonary nodules, colonic diverticulosis without acute diverticulitis, and severe degenerative changes within the lower lumbar spine noted.  Echo showed an EF of 45 to 50%, mild LVH, dyskinesis of the left ventricular, basal mid inferolateral wall, and inferior wall, grade 1 diastolic dysfunction, normal RV systolic function and ventricular cavity size, and trivial mitral regurgitation.  Lexiscan MPI showed no evidence of significant ischemia and was overall low risk.   He was seen in follow-up later in 11/2020 and was doing reasonably well from a cardiac perspective.  He did continue to note randomly occurring intermittent episodes of chest discomfort that were not as severe as what he experienced leading to his hospital  admission.  It was noted he was experiencing episodes of hypotension following transition from Toprol to carvedilol by outside office.  In this setting, he was transitioned back to Toprol.  Given his stable to improving symptoms, continued medical therapy was recommended.  However, we received a message later that month that he was experiencing worsening exertional dyspnea and angina that improved with rest.  In this setting, he underwent diagnostic R/LHC on 01/04/2021 which demonstrated chronic total/subtotal occlusion of the mid RCA as well as 30% proximal and mid LAD stenoses.  He underwent successful PCI to the mid RCA.  Normal LVEDP.   He was seen in hospital follow-up in 01/2021 and was doing well from a cardiac perspective.  Following his PCI, he was experiencing some dizziness.  Upon reviewing his medications, he was taking a full 25 mg of Toprol-XL and had associated hypotension.  Following decrease of Toprol to the previously recommended 12.5 mg his BP had improved with resolution of dizziness.  Following PCI, he noted resolution of exertional chest discomfort and dyspnea.  He was last seen in the office on 04/13/2021 and continued to do well from a cardiac perspective.  It was noted he had been having some episodes of hypotension with home BP cuff readings.  He did note some intermittent positional dizziness.  He also noted several episodes of palpitations, though did not recall if these episodes occurred during episodes of dizziness.  Given episodes of hypotension, metoprolol was discontinued.  Preliminary report on Zio patch demonstrated a predominant rhythm of sinus with an average heart rate of 68 bpm (range 47 to 174 bpm), 17 episodes  of SVT with the fastest interval lasting 5 beats with a maximal rate of 174 bpm and the longest interval lasting 12 beats with an average rate of 107 bpm, occasional PACs, rare atrial couplets, atrial triplets, PVCs, ventricular couplets, and ventricular triplets.  He  comes in accompanied by his wife today and continues to do very well from a cardiac perspective.  He has been without any symptoms concerning for angina or decompensation.  Since he was last seen, he did have to ambulate a long distance to go to a [employer] basketball game, and with this he was without any symptoms of angina or dyspnea.  He also notes his dizziness has improved.  No further palpitations.  Blood pressures have been reasonably controlled, especially more recently with most readings in the low 100s to 1 teens systolic.  He is tolerating antiplatelet therapy without issues.  No symptoms concerning for bleeding.   Labs independently reviewed: 04/2021 - HGB 14.7, PLT 189, BUN 24, SCr 1.22, potassium 4.9 01/2021 - TC 116, TG 99, HDL 36, LDL 60 11/2020 - albumin 4.2, AST/ALT normal, magnesium 1.9  Past Medical History:  Diagnosis Date   Anxiety    BPH (benign prostatic hyperplasia)    CAD in native artery    Cataract    bilateral- Dec 2021 and jan 2022   GERD (gastroesophageal reflux disease)    Hypercholesterolemia    Hyperlipidemia    Hypertension     Past Surgical History:  Procedure Laterality Date   APPENDECTOMY     CATARACT EXTRACTION, BILATERAL     COLONOSCOPY     CORONARY STENT INTERVENTION N/A 01/04/2021   Procedure: CORONARY STENT INTERVENTION;  Surgeon: Nelva Bush, MD;  Location: [facility];  Service: Cardiovascular;  Laterality: N/A;   RIGHT/LEFT HEART CATH AND CORONARY ANGIOGRAPHY Bilateral 01/04/2021   Procedure: RIGHT/LEFT HEART CATH AND CORONARY ANGIOGRAPHY;  Surgeon: Nelva Bush, MD;  Location: [facility];  Service: Cardiovascular;  Laterality: Bilateral;   TONSILLECTOMY      Current Medications: Current Meds  Medication Sig   aspirin EC 81 MG EC tablet Take 1 tablet (81 mg total) by mouth daily. Swallow whole.   atorvastatin (LIPITOR) 40 MG tablet Take 1 tablet (40 mg total) by mouth daily.   Bacillus Coagulans-Inulin (PROBIOTIC)  1-250 BILLION-MG CAPS Take 1 capsule by mouth daily.   clopidogrel (PLAVIX) 75 MG tablet Take 1 tablet (75 mg total) by mouth daily.   Cyanocobalamin (VITAMIN B 12 PO) Take 1,000 mcg by mouth daily at 12 noon.   erythromycin ophthalmic ointment Place 1 application into both eyes at bedtime.   primidone (MYSOLINE) 50 MG tablet Take 50 mg by mouth at bedtime.   Propylene Glycol (SYSTANE COMPLETE OP) Place 1 drop into both eyes in the morning, at noon, and at bedtime.   tamsulosin (FLOMAX) 0.4 MG CAPS capsule Take 0.4 mg by mouth daily after supper.   [DISCONTINUED] metoprolol tartrate (LOPRESSOR) 25 MG tablet Take 0.5 tablets (12.5 mg total) by mouth as needed (As needed for palpitations).    Allergies:   Patient has no known allergies.   Social History   Socioeconomic History   Marital status: Married    Spouse name: Not on file   Number of children: Not on file   Years of education: Not on file   Highest education level: Not on file  Occupational History   Not on file  Tobacco Use   Smoking status: Former    Types: Cigarettes  Quit date: 2012    Years since quitting: 11.0   Smokeless tobacco: Never  Vaping Use   Vaping Use: Never used  Substance and Sexual Activity   Alcohol use: Yes    Comment: once a month liquor   Drug use: Never   Sexual activity: Not Currently  Other Topics Concern   Not on file  Social History Narrative   Not on file   Social Determinants of Health   Financial Resource Strain: Not on file  Food Insecurity: Not on file  Transportation Needs: Not on file  Physical Activity: Not on file  Stress: Not on file  Social Connections: Not on file     Family History:  The patient's family history includes Alzheimer's disease in his father; Diabetes in his maternal grandmother.  ROS:   12-point review of systems is negative unless otherwise noted in the HPI   EKGs/Labs/Other Studies Reviewed:    Studies reviewed were summarized above. The  additional studies were reviewed today:  Zio patch 03/2021: Patient had a min HR of 47 bpm, max HR of 174 bpm, and avg HR of 68 bpm. Predominant underlying rhythm was Sinus Rhythm. 17 Supraventricular Tachycardia runs occurred, the run with the fastest interval lasting 5 beats with a max rate of 174 bpm, the longest lasting 12 beats with an avg rate of 107 bpm. Isolated SVEs were occasional (1.2%, 15834), SVE Couplets were rare (<1.0%, 93), and SVE Triplets were rare (<1.0%, 22). Isolated VEs were rare (<1.0%, 10769), VE Couplets were rare (<1.0%, 11), and VE Triplets were rare (<1.0%, 1). Ventricular Bigeminy and Trigeminy were present.  __________  [facility] 12/2020: Conclusion Coronary artery disease including chronic total/subtotal occlusion of the mid RCA, and 30% proximal and mid LAD. Normal left ventricular end-diastolic pressure of 15 mmHg RA 8, RV 25/11, PCP WP 11, PA 29/10 (19) CO 6.77, CI 3.15 Successful PCI to the mid RCA with placement of a 3.5 x 22 mm Onyx DES with excellent angiographic result and TIMI-3 flow   Recommendation Dual antiplatelet therapy with aspirin and Plavix for 6 months, ideally longer Ongoing aggressive secondary prevention Admit overnight for monitoring.  Discharge in the morning. __________   2D echo 11/16/2020: 1. Left ventricular ejection fraction, by estimation, is 45 to 50%. The  left ventricle has mildly decreased function. The left ventricle  demonstrates regional wall motion abnormalities (see scoring  diagram/findings for description). There is mild left  ventricular hypertrophy. Left ventricular diastolic parameters are  consistent with Grade I diastolic dysfunction (impaired relaxation). There  is dyskinesis of the left ventricular, basal-mid inferolateral wall and  inferior wall.   2. Right ventricular systolic function is normal. The right ventricular  size is normal.   3. The mitral valve was not well visualized. Trivial mitral valve   regurgitation.   4. The aortic valve was not well visualized. Aortic valve regurgitation  is not visualized. No aortic stenosis is present. __________   [facility] MPI 11/17/2020: The study is normal. This is a low risk study. The left ventricular ejection fraction is hyperdynamic (>65%). There is no evidence for ischemia    EKG:  EKG is not ordered today given recent Zio patch.    Recent Labs: 11/15/2020: B Natriuretic Peptide 112.5 11/17/2020: Magnesium 1.9 01/27/2021: ALT 18 04/14/2021: BUN 24; Creatinine, Ser 1.22; Hemoglobin 14.7; Platelets 189; Potassium 4.9; Sodium 132  Recent Lipid Panel    Component Value Date/Time   CHOL 116 01/27/2021 0927   TRIG 99 01/27/2021 0927  HDL 36 (L) 01/27/2021 0927   CHOLHDL 3.2 01/27/2021 0927   VLDL 20 01/27/2021 0927   LDLCALC 60 01/27/2021 0927    PHYSICAL EXAM:    VS:  BP 128/82 (BP Location: Left Arm, Patient Position: Sitting, Cuff Size: Normal)    Pulse 78    Ht 5\' 11"  (1.803 m)    Wt 212 lb (96.2 kg)    SpO2 98%    BMI 29.57 kg/m   BMI: Body mass index is 29.57 kg/m.  Physical Exam Vitals reviewed.  Constitutional:      Appearance: He is well-developed.  HENT:     Head: Normocephalic and atraumatic.  Eyes:     General:        Right eye: No discharge.        Left eye: No discharge.  Neck:     Vascular: No JVD.  Cardiovascular:     Rate and Rhythm: Normal rate and regular rhythm.     Pulses:          Posterior tibial pulses are 2+ on the right side and 2+ on the left side.     Heart sounds: Normal heart sounds, S1 normal and S2 normal. Heart sounds not distant. No midsystolic click and no opening snap. No murmur heard.   No friction rub.  Pulmonary:     Effort: Pulmonary effort is normal. No respiratory distress.     Breath sounds: Normal breath sounds. No decreased breath sounds, wheezing or rales.  Chest:     Chest wall: No tenderness.  Abdominal:     General: There is no distension.     Palpations: Abdomen is  soft.     Tenderness: There is no abdominal tenderness.  Musculoskeletal:     Cervical back: Normal range of motion.     Right lower leg: No edema.     Left lower leg: No edema.  Skin:    General: Skin is warm and dry.     Nails: There is no clubbing.  Neurological:     Mental Status: He is alert and oriented to person, place, and time.  Psychiatric:        Speech: Speech normal.        Behavior: Behavior normal.        Thought Content: Thought content normal.        Judgment: Judgment normal.    Wt Readings from Last 3 Encounters:  05/27/21 212 lb (96.2 kg)  04/28/21 213 lb 6.4 oz (96.8 kg)  04/13/21 206 lb (93.4 kg)     ASSESSMENT & PLAN:   CAD involving the native coronary arteries without angina: He is doing well without any symptoms concerning for chest pain.  Continue ASA and clopidogrel without interruption for at least 6 months from date of PCI (01/04/2021), ideally longer.  Aggressive risk factor modification and secondary prevention including continued atorvastatin is recommended.  No longer on scheduled metoprolol as outlined below.  No indication for further ischemic testing at this time.  Systolic dysfunction: He appears euvolemic and well compensated.  Metoprolol has been discontinued secondary to prior episodes of hypotension.  Escalation of GDMT continues to be limited by blood pressure.  Not currently on MRA secondary to underlying renal dysfunction.  Not requiring a standing diuretic.  Hypotension: Resolved following discontinuation of metoprolol.  Palpitations/pSVT: Recent Zio patch demonstrated 17 episodes of PSVT with the longest episode lasting just 12 beats.  No sustained or malignant arrhythmias noted.  Vagal maneuvers were discussed  in detail.  He will also take Lopressor 12.5 mg twice daily as needed for sustained tachypalpitations lasting greater than 1 to 2 hours.  CKD stage IIIa: Stable on most recent check.  Followed by nephrology.  HLD: LDL 60 in  01/2021.  He remains on atorvastatin.    Disposition: F/u with Dr. Saunders Revel or an APP in 6 months, sooner if needed.   Medication Adjustments/Labs and Tests Ordered: Current medicines are reviewed at length with the patient today.  Concerns regarding medicines are outlined above. Medication changes, Labs and Tests ordered today are summarized above and listed in the Patient Instructions accessible in Encounters.   Signed, Christell Faith, PA-C 05/27/2021 1:10 PM     [place] [address] [place], [place] [address] [phone]

## 2021-05-27 ENCOUNTER — Other Ambulatory Visit: Payer: Self-pay

## 2021-05-27 ENCOUNTER — Encounter: Payer: Self-pay | Admitting: Physician Assistant

## 2021-05-27 ENCOUNTER — Ambulatory Visit: Payer: Medicare PPO | Admitting: Physician Assistant

## 2021-05-27 VITALS — BP 128/82 | HR 78 | Ht 71.0 in | Wt 212.0 lb

## 2021-05-27 DIAGNOSIS — I2510 Atherosclerotic heart disease of native coronary artery without angina pectoris: Secondary | ICD-10-CM

## 2021-05-27 DIAGNOSIS — R002 Palpitations: Secondary | ICD-10-CM | POA: Diagnosis not present

## 2021-05-27 DIAGNOSIS — E785 Hyperlipidemia, unspecified: Secondary | ICD-10-CM

## 2021-05-27 DIAGNOSIS — I519 Heart disease, unspecified: Secondary | ICD-10-CM

## 2021-05-27 DIAGNOSIS — I959 Hypotension, unspecified: Secondary | ICD-10-CM

## 2021-05-27 DIAGNOSIS — I471 Supraventricular tachycardia: Secondary | ICD-10-CM

## 2021-05-27 DIAGNOSIS — N1831 Chronic kidney disease, stage 3a: Secondary | ICD-10-CM

## 2021-05-27 MED ORDER — METOPROLOL TARTRATE 25 MG PO TABS: 12.5000 mg | ORAL_TABLET | Freq: Two times a day (BID) | ORAL | 3 refills | Status: DC | PRN

## 2021-05-27 MED ORDER — METOPROLOL TARTRATE 25 MG PO TABS: 12.5000 mg | ORAL_TABLET | ORAL | 3 refills | Status: DC | PRN

## 2021-05-27 NOTE — Patient Instructions (Addendum)
Medication Instructions:  Your physician has recommended you make the following change in your medication:   Start back on Lopressor (Metoprolol tartrate) 25 mg and take one half tablet (12.5 mg) twice a day as needed for palpitations  *If you need a refill on your cardiac medications before your next appointment, please call your pharmacy*   Lab Work: None  If you have labs (blood work) drawn today and your tests are completely normal, you will receive your results only by: [place] (if you have MyChart) OR A paper copy in the mail If you have any lab test that is abnormal or we need to change your treatment, we will call you to review the results.   Testing/Procedures: None   Follow-Up: At [facility], you and your health needs are our priority.  As part of our continuing mission to provide you with exceptional heart care, we have created designated Provider Care Teams.  These Care Teams include your primary Cardiologist (physician) and Advanced Practice Providers (APPs -  Physician Assistants and Nurse Practitioners) who all work together to provide you with the care you need, when you need it.   Your next appointment:   6 month(s)  The format for your next appointment:   In Person  Provider:   Nelva Bush, MD or Christell Faith, PA-C

## 2021-06-02 ENCOUNTER — Other Ambulatory Visit: Payer: Self-pay | Admitting: *Deleted

## 2021-06-02 DIAGNOSIS — D472 Monoclonal gammopathy: Secondary | ICD-10-CM

## 2021-06-07 ENCOUNTER — Other Ambulatory Visit: Payer: Self-pay

## 2021-06-07 DIAGNOSIS — D472 Monoclonal gammopathy: Secondary | ICD-10-CM

## 2021-06-17 ENCOUNTER — Other Ambulatory Visit: Payer: Self-pay

## 2021-06-17 ENCOUNTER — Other Ambulatory Visit: Payer: Medicare PPO

## 2021-06-17 ENCOUNTER — Inpatient Hospital Stay: Payer: Medicare PPO | Attending: Nurse Practitioner

## 2021-06-17 ENCOUNTER — Ambulatory Visit
Admission: RE | Admit: 2021-06-17 | Discharge: 2021-06-17 | Disposition: A | Discharge status: Home / Self Care | Payer: Medicare PPO | Attending: Oncology | Admitting: Oncology

## 2021-06-17 ENCOUNTER — Ambulatory Visit
Admission: RE | Admit: 2021-06-17 | Discharge: 2021-06-17 | Disposition: A | Discharge status: Home / Self Care | Payer: Medicare PPO | Source: Ambulatory Visit | Attending: Oncology | Admitting: Oncology

## 2021-06-17 DIAGNOSIS — I129 Hypertensive chronic kidney disease with stage 1 through stage 4 chronic kidney disease, or unspecified chronic kidney disease: Secondary | ICD-10-CM | POA: Diagnosis not present

## 2021-06-17 DIAGNOSIS — Z87891 Personal history of nicotine dependence: Secondary | ICD-10-CM | POA: Insufficient documentation

## 2021-06-17 DIAGNOSIS — D472 Monoclonal gammopathy: Secondary | ICD-10-CM | POA: Insufficient documentation

## 2021-06-17 DIAGNOSIS — N189 Chronic kidney disease, unspecified: Secondary | ICD-10-CM | POA: Diagnosis not present

## 2021-06-17 LAB — CBC WITH DIFFERENTIAL/PLATELET
Abs Immature Granulocytes: 0.03 10*3/uL (ref 0.00–0.07)
Basophils Absolute: 0 10*3/uL (ref 0.0–0.1)
Basophils Relative: 1 %
Eosinophils Absolute: 0.2 10*3/uL (ref 0.0–0.5)
Eosinophils Relative: 2 %
HCT: 44.8 % (ref 39.0–52.0)
Hemoglobin: 14.6 g/dL (ref 13.0–17.0)
Immature Granulocytes: 1 %
Lymphocytes Relative: 14 %
Lymphs Abs: 0.9 10*3/uL (ref 0.7–4.0)
MCH: 30.4 pg (ref 26.0–34.0)
MCHC: 32.6 g/dL (ref 30.0–36.0)
MCV: 93.3 fL (ref 80.0–100.0)
Monocytes Absolute: 0.7 10*3/uL (ref 0.1–1.0)
Monocytes Relative: 12 %
Neutro Abs: 4.4 10*3/uL (ref 1.7–7.7)
Neutrophils Relative %: 70 %
Platelets: 182 10*3/uL (ref 150–400)
RBC: 4.8 MIL/uL (ref 4.22–5.81)
RDW: 12.9 % (ref 11.5–15.5)
WBC: 6.2 10*3/uL (ref 4.0–10.5)
nRBC: 0 % (ref 0.0–0.2)

## 2021-06-17 LAB — COMPREHENSIVE METABOLIC PANEL
ALT: 23 U/L (ref 0–44)
AST: 19 U/L (ref 15–41)
Albumin: 4.2 g/dL (ref 3.5–5.0)
Alkaline Phosphatase: 69 U/L (ref 38–126)
Anion gap: 5 (ref 5–15)
BUN: 25 mg/dL — ABNORMAL HIGH (ref 8–23)
CO2: 26 mmol/L (ref 22–32)
Calcium: 8.4 mg/dL — ABNORMAL LOW (ref 8.9–10.3)
Chloride: 98 mmol/L (ref 98–111)
Creatinine, Ser: 1.29 mg/dL — ABNORMAL HIGH (ref 0.61–1.24)
GFR, Estimated: 57 mL/min — ABNORMAL LOW (ref >60)
Glucose, Bld: 101 mg/dL — ABNORMAL HIGH (ref 70–99)
Potassium: 3.9 mmol/L (ref 3.5–5.1)
Sodium: 129 mmol/L — ABNORMAL LOW (ref 135–145)
Total Bilirubin: 0.3 mg/dL (ref 0.3–1.2)
Total Protein: 8.2 g/dL — ABNORMAL HIGH (ref 6.5–8.1)

## 2021-06-17 LAB — RENAL FUNCTION PANEL
Albumin: 3.9 g/dL (ref 3.5–5.0)
Anion gap: 8 (ref 5–15)
BUN: 25 mg/dL — ABNORMAL HIGH (ref 8–23)
CO2: 26 mmol/L (ref 22–32)
Calcium: 8.7 mg/dL — ABNORMAL LOW (ref 8.9–10.3)
Chloride: 97 mmol/L — ABNORMAL LOW (ref 98–111)
Creatinine, Ser: 1.3 mg/dL — ABNORMAL HIGH (ref 0.61–1.24)
GFR, Estimated: 57 mL/min — ABNORMAL LOW (ref >60)
Glucose, Bld: 98 mg/dL (ref 70–99)
Phosphorus: 2.9 mg/dL (ref 2.5–4.6)
Potassium: 4 mmol/L (ref 3.5–5.1)
Sodium: 131 mmol/L — ABNORMAL LOW (ref 135–145)

## 2021-06-18 LAB — MICROALBUMIN / CREATININE URINE RATIO
Creatinine, Urine: 143.3 mg/dL
Microalb Creat Ratio: 9 mg/g creat (ref 0–29)
Microalb, Ur: 12.6 ug/mL — ABNORMAL HIGH

## 2021-06-18 LAB — PARATHYROID HORMONE, INTACT (NO CA): PTH: 27 pg/mL (ref 15–65)

## 2021-06-20 ENCOUNTER — Ambulatory Visit: Payer: Medicare PPO | Admitting: Oncology

## 2021-06-20 LAB — KAPPA/LAMBDA LIGHT CHAINS
Kappa free light chain: 159.9 mg/L — ABNORMAL HIGH (ref 3.3–19.4)
Kappa, lambda light chain ratio: 10.59 — ABNORMAL HIGH (ref 0.26–1.65)
Lambda free light chains: 15.1 mg/L (ref 5.7–26.3)

## 2021-06-21 LAB — MULTIPLE MYELOMA PANEL, SERUM
Albumin SerPl Elph-Mcnc: 4 g/dL (ref 2.9–4.4)
Albumin/Glob SerPl: 1.2 (ref 0.7–1.7)
Alpha 1: 0.2 g/dL (ref 0.0–0.4)
Alpha2 Glob SerPl Elph-Mcnc: 0.7 g/dL (ref 0.4–1.0)
B-Globulin SerPl Elph-Mcnc: 0.8 g/dL (ref 0.7–1.3)
Gamma Glob SerPl Elph-Mcnc: 1.9 g/dL — ABNORMAL HIGH (ref 0.4–1.8)
Globulin, Total: 3.6 g/dL (ref 2.2–3.9)
IgA: 83 mg/dL (ref 61–437)
IgG (Immunoglobin G), Serum: 2006 mg/dL — ABNORMAL HIGH (ref 603–1613)
IgM (Immunoglobulin M), Srm: 24 mg/dL (ref 15–143)
M Protein SerPl Elph-Mcnc: 1.3 g/dL — ABNORMAL HIGH
Total Protein ELP: 7.6 g/dL (ref 6.0–8.5)

## 2021-06-24 ENCOUNTER — Other Ambulatory Visit: Payer: Self-pay

## 2021-06-24 ENCOUNTER — Inpatient Hospital Stay: Payer: Medicare PPO | Admitting: Nurse Practitioner

## 2021-06-24 ENCOUNTER — Encounter: Payer: Self-pay | Admitting: Nurse Practitioner

## 2021-06-24 VITALS — BP 143/75 | HR 75 | Temp 96.1°F | Resp 20 | Wt 212.1 lb

## 2021-06-24 DIAGNOSIS — D472 Monoclonal gammopathy: Secondary | ICD-10-CM

## 2021-06-24 NOTE — Progress Notes (Signed)
Hematology/Oncology Consult Note [facility]  Telephone:([phone] [phone]  Patient Care Team: Sofie Hartigan, MD as PCP - General (Family Medicine) End, [name], MD as PCP - Cardiology (Cardiology)   Name of the patient: Steve Warren  [id number]  [birth year]   Date of visit: 06/24/21  Diagnosis-IgG kappa MGUS  Chief complaint/ Reason for visit-routine follow-up of IgG kappa MGUS  Heme/Onc history: patient is 76 year old male who was recently seen by rheumatology for positive ANA.  He has a history of stage III chronic kidney disease, hypertension, hyperlipidemia and GERD.  He has been referred to [place] for abnormal SPEP.  His most recent labs from 11/07/2017 showed white count of 8.3, H&H of 12.8/38 with an MCV of 93.4 and a platelet count of 224.  LFTs were within normal limits.  Serum calcium was 9.3.  Serum creatinine was elevated at 1.58.  As a part of work-up for chronic kidney disease patient had an SPEP done which showed abnormal protein in the gammaglobulin and a possible second abnormal protein which may represent monoclonal immunoglobulins.  Recent CBC from 12/20/2017 showed H&H of 14.5/44.5.    Results of blood work from 01/07/2018 were as follows: CBC showed white count of 6.8, H&H of 14.5/42.4 and a platelet count of 196.  CMP was significant for elevated creatinine of 1.56.  Multiple myeloma panel showed 0.6 g of IgG kappa light chain monoclonal protein.  Free kappa light chain was elevated at 92.2 and kappa lambda light chain ratio was abnormal at 5.95.  Iron studies and folate were normal.  B12 levels were low at 224   Bone marrow biopsy showed 2% plasma cells with kappa light chain restriction      Interval history- Patient is 76 year old male who return sto clinic for ocntinued surveillance of MGUS. His energy has improved post stent. He feels well. Remains active. No dizziness or weakness.  No headaches, numbness or tingling, or  changes in vision or hearing.  No abnormal bleeding or bruising.  No lumps or bumps.  No abdominal pain, changes in bowel movements, nausea or vomiting.  No fever or night sweats.  No unintentional weight loss.  No fatigue or weakness.  No new bone pain.  No other specific complaints today.  ECOG PS- 1 Pain scale- 0   Review of systems- Review of Systems  Constitutional:  Negative for chills, fever, malaise/fatigue and weight loss.  HENT:  Negative for congestion, ear discharge and nosebleeds.   Eyes:  Negative for blurred vision.  Respiratory:  Negative for cough, hemoptysis, sputum production, shortness of breath and wheezing.   Cardiovascular:  Negative for chest pain, palpitations, orthopnea and claudication.  Gastrointestinal:  Negative for abdominal pain, blood in stool, constipation, diarrhea, heartburn, melena, nausea and vomiting.  Genitourinary:  Negative for dysuria, flank pain, frequency, hematuria and urgency.  Musculoskeletal:  Negative for back pain, joint pain and myalgias.  Skin:  Negative for rash.  Neurological:  Negative for dizziness, tingling, focal weakness, seizures, weakness and headaches.  Endo/Heme/Allergies:  Does not bruise/bleed easily.  Psychiatric/Behavioral:  Negative for depression and suicidal ideas. The patient does not have insomnia.      No Known Allergies   Past Medical History:  Diagnosis Date   Anxiety    BPH (benign prostatic hyperplasia)    CAD in native artery    Cataract    bilateral- Dec 2021 and jan 2022   GERD (gastroesophageal reflux disease)    Hypercholesterolemia  Hyperlipidemia    Hypertension      Past Surgical History:  Procedure Laterality Date   APPENDECTOMY     CATARACT EXTRACTION, BILATERAL     COLONOSCOPY     CORONARY STENT INTERVENTION N/A 01/04/2021   Procedure: CORONARY STENT INTERVENTION;  Surgeon: Nelva Bush, MD;  Location: [facility];  Service: Cardiovascular;  Laterality: N/A;   RIGHT/LEFT  HEART CATH AND CORONARY ANGIOGRAPHY Bilateral 01/04/2021   Procedure: RIGHT/LEFT HEART CATH AND CORONARY ANGIOGRAPHY;  Surgeon: Nelva Bush, MD;  Location: [facility];  Service: Cardiovascular;  Laterality: Bilateral;   TONSILLECTOMY      Social History   Socioeconomic History   Marital status: Married    Spouse name: Not on file   Number of children: Not on file   Years of education: Not on file   Highest education level: Not on file  Occupational History   Not on file  Tobacco Use   Smoking status: Former    Types: Cigarettes    Quit date: 2012    Years since quitting: 11.1   Smokeless tobacco: Never  Vaping Use   Vaping Use: Never used  Substance and Sexual Activity   Alcohol use: Yes    Comment: once a month liquor   Drug use: Never   Sexual activity: Not Currently  Other Topics Concern   Not on file  Social History Narrative   Not on file   Social Determinants of Health   Financial Resource Strain: Not on file  Food Insecurity: Not on file  Transportation Needs: Not on file  Physical Activity: Not on file  Stress: Not on file  Social Connections: Not on file  Intimate Partner Violence: Not on file    Family History  Problem Relation Age of Onset   Alzheimer's disease Father    Diabetes Maternal Grandmother      Current Outpatient Medications:    aspirin EC 81 MG EC tablet, Take 1 tablet (81 mg total) by mouth daily. Swallow whole., Disp: 30 tablet, Rfl: 0   atorvastatin (LIPITOR) 40 MG tablet, Take 1 tablet (40 mg total) by mouth daily., Disp: 90 tablet, Rfl: 3   Bacillus Coagulans-Inulin (PROBIOTIC) 1-250 BILLION-MG CAPS, Take 1 capsule by mouth daily., Disp: , Rfl:    clopidogrel (PLAVIX) 75 MG tablet, Take 1 tablet (75 mg total) by mouth daily., Disp: 30 tablet, Rfl: 11   Cyanocobalamin (VITAMIN B 12 PO), Take 1,000 mcg by mouth daily at 12 noon., Disp: , Rfl:    erythromycin ophthalmic ointment, Place 1 application into both eyes at  bedtime., Disp: , Rfl:    primidone (MYSOLINE) 50 MG tablet, Take 50 mg by mouth at bedtime., Disp: , Rfl:    Propylene Glycol (SYSTANE COMPLETE OP), Place 1 drop into both eyes in the morning, at noon, and at bedtime., Disp: , Rfl:    tamsulosin (FLOMAX) 0.4 MG CAPS capsule, Take 0.4 mg by mouth daily after supper., Disp: , Rfl:    metoprolol tartrate (LOPRESSOR) 25 MG tablet, Take 0.5 tablets (12.5 mg total) by mouth 2 (two) times daily as needed (As needed for palpitations)., Disp: 45 tablet, Rfl: 3  Physical exam:  Vitals:   06/24/21 1019  BP: (!) 143/75  Pulse: 75  Resp: 20  Temp: (!) 96.1 F (35.6 C)  SpO2: 100%  Weight: 212 lb 1.6 oz (96.2 kg)   Physical Exam Constitutional:      General: He is not in acute distress. Cardiovascular:  Rate and Rhythm: Normal rate and regular rhythm.     Heart sounds: Normal heart sounds.  Pulmonary:     Effort: Pulmonary effort is normal.     Breath sounds: Normal breath sounds.  Skin:    General: Skin is warm and dry.  Neurological:     Mental Status: He is alert and oriented to person, place, and time.     CMP Latest Ref Rng & Units 06/17/2021  Glucose 70 - 99 mg/dL 98  BUN 8 - 23 mg/dL 25(H)  Creatinine 0.61 - 1.24 mg/dL 1.30(H)  Sodium 135 - 145 mmol/L 131(L)  Potassium 3.5 - 5.1 mmol/L 4.0  Chloride 98 - 111 mmol/L 97(L)  CO2 22 - 32 mmol/L 26  Calcium 8.9 - 10.3 mg/dL 8.7(L)  Total Protein 6.5 - 8.1 g/dL -  Total Bilirubin 0.3 - 1.2 mg/dL -  Alkaline Phos 38 - 126 U/L -  AST 15 - 41 U/L -  ALT 0 - 44 U/L -   CBC Latest Ref Rng & Units 06/17/2021  WBC 4.0 - 10.5 K/uL 6.2  Hemoglobin 13.0 - 17.0 g/dL 14.6  Hematocrit 39.0 - 52.0 % 44.8  Platelets 150 - 400 K/uL 182     Assessment and plan- Patient is a 76 y.o. male with history of IgG kappa MGUS here for routine follow-up  Typically an IgG M protein of less than 1.5 g in the absence of crab criteria does not warrant a bone marrow biopsy.    #IgG Monoclonal  Gammopathy of Undetermined Significance- Hemoglobin and hematocrit remain normal. His baseline CKD is stable with creatinine around 1.3. Calcium is stable at 8.4, slightly low. Multiple myeloma panel shows IgG level of 2006 which is gradually uptrending over past year. M protein remains 1.3 which is stable over past year but increased over past few years. Serum free light chain ratio remains stable at 10.59 which is stable to slightly increased over past 2 years. Bone survey was indepdenetnly reviewed and negative for lytic lesions. We reviewed labs in detail today and reviewed that his anemia and renal function are stable and can consider conservative monitoring. Should M protein rise to > 1.5, could consider bone marrow biopsy.   6 mo- lab only 1 year- lab & day to week later see Dr Janese Banks for results- la    Visit Diagnosis 1. MGUS (monoclonal gammopathy of unknown significance)    Beckey Rutter, DNP, AGNP-C [place] [facility] [phone] (clinic) 06/24/2021

## 2021-10-14 ENCOUNTER — Telehealth: Payer: Self-pay | Admitting: Physician Assistant

## 2021-10-14 NOTE — Telephone Encounter (Signed)
At a minimum, I suspect that Steve Warren will follow-up a blood chemistry to re-eval kidney function, if this hasn't been done by pcp since February.

## 2021-10-14 NOTE — Telephone Encounter (Signed)
Patient's wife would like to know if at his upcoming appt on 7/13 with Thurmond Butts will he need labs ordered.  If he does, she would like to have them done at the PCP's office, since he is having a physical done the day before. Please advise.

## 2021-10-18 NOTE — Telephone Encounter (Signed)
Spoke with patients wife and she reviewed the labs his primary care provider ordered for him to have done. BMP, CBC, and Lipid panel to be done the day before his appointment with PCP. Advised those would cover anything we would need and to have them fax those to [place] at [phone]. She verbalized understanding with no further questions at this time.

## 2021-11-24 ENCOUNTER — Ambulatory Visit: Payer: Medicare PPO | Admitting: Physician Assistant

## 2021-11-24 ENCOUNTER — Encounter: Payer: Self-pay | Admitting: Physician Assistant

## 2021-11-24 ENCOUNTER — Encounter: Payer: Self-pay | Admitting: Nurse Practitioner

## 2021-11-24 VITALS — BP 114/70 | HR 68 | Ht 71.0 in | Wt 204.4 lb

## 2021-11-24 DIAGNOSIS — I2510 Atherosclerotic heart disease of native coronary artery without angina pectoris: Secondary | ICD-10-CM | POA: Diagnosis not present

## 2021-11-24 DIAGNOSIS — R002 Palpitations: Secondary | ICD-10-CM | POA: Diagnosis not present

## 2021-11-24 DIAGNOSIS — E785 Hyperlipidemia, unspecified: Secondary | ICD-10-CM | POA: Diagnosis not present

## 2021-11-24 DIAGNOSIS — I959 Hypotension, unspecified: Secondary | ICD-10-CM

## 2021-11-24 DIAGNOSIS — I471 Supraventricular tachycardia: Secondary | ICD-10-CM | POA: Diagnosis not present

## 2021-11-24 DIAGNOSIS — N1831 Chronic kidney disease, stage 3a: Secondary | ICD-10-CM

## 2021-11-24 DIAGNOSIS — I519 Heart disease, unspecified: Secondary | ICD-10-CM

## 2021-11-24 NOTE — Progress Notes (Signed)
Cardiology Office Note    Date:  11/24/2021   ID:  CORWIN KUIKEN, DOB 1946-04-27, MRN [id number]  PCP:  Sofie Hartigan, MD  Cardiologist:  Nelva Bush, MD  Electrophysiologist:  None   Chief Complaint: Follow-up  History of Present Illness:   Raylin Winer Wilensky is a 76 y.o. male with history of CAD status post PCI to the RCA on 01/04/2021, aortic atherosclerosis, systolic dysfunction, pSVT, CKD stage IIIa, MGUS, essential tremor, chronic dizziness, HLD, BPH, and GERD who presents for follow up of follow-up of CAD.   He was admitted to [facility] in 11/2020 with a 4-day history of intermittent chest discomfort described as a sharp sensation in the center of his chest that radiated to his back and seemed to be associated primarily with activity, though also wondered if eating precipitated it.  There was some shortness of breath when the pain occurred.  High-sensitivity troponin peaked at 86.  BNP 112.  EKG demonstrated sinus rhythm with no acute ischemic ST-T changes. Chest x-ray showed low lung volumes without acute abnormality.  CTA chest/abdomen/pelvis showed no acute vascular abnormality with aortic atherosclerosis as well as at least two-vessel coronary artery calcifications, stable pulmonary nodules, colonic diverticulosis without acute diverticulitis, and severe degenerative changes within the lower lumbar spine noted.  Echo showed an EF of 45 to 50%, mild LVH, dyskinesis of the left ventricular, basal mid inferolateral wall, and inferior wall, grade 1 diastolic dysfunction, normal RV systolic function and ventricular cavity size, and trivial mitral regurgitation.  Lexiscan MPI showed no evidence of significant ischemia and was overall low risk.   He was seen in follow-up later in 11/2020 and was doing reasonably well from a cardiac perspective.  He did continue to note randomly occurring intermittent episodes of chest discomfort that were not as severe as what he experienced leading to his  hospital admission.  It was noted he was experiencing episodes of hypotension following transition from Toprol to carvedilol by outside office.  In this setting, he was transitioned back to Toprol.  Given his stable to improving symptoms, continued medical therapy was recommended.  However, we received a message later that month that he was experiencing worsening exertional dyspnea and angina that improved with rest.  In this setting, he underwent diagnostic R/LHC on 01/04/2021 which demonstrated chronic total/subtotal occlusion of the mid RCA as well as 30% proximal and mid LAD stenoses.  He underwent successful PCI to the mid RCA.  Normal LVEDP.   He was seen in hospital follow-up in 01/2021 and was doing well from a cardiac perspective.  Following his PCI, he was experiencing some dizziness.  Upon reviewing his medications, he was taking a full 25 mg of Toprol-XL and had associated hypotension.  Following decrease of Toprol to the previously recommended 12.5 mg his BP had improved with resolution of dizziness.  Following PCI, he noted resolution of exertional chest discomfort and dyspnea.  He was seen in the office on 04/13/2021 and continued to do well from a cardiac perspective.  It was noted he had been having some episodes of hypotension with home BP cuff readings.  He did note some intermittent positional dizziness.  He also noted several episodes of palpitations, though did not recall if these episodes occurred during episodes of dizziness.  Given episodes of hypotension, metoprolol was discontinued.  Subsequent Zio patch demonstrated a predominant rhythm of sinus with an average heart rate of 68 bpm (range 47 to 174 bpm), 17 episodes of SVT with  the fastest interval lasting 5 beats with a maximal rate of 174 bpm and the longest interval lasting 12 beats with an average rate of 107 bpm, occasional PACs, rare atrial couplets, atrial triplets, PVCs, ventricular couplets, and ventricular triplets.  He was last  seen in the office in 05/2021 and was without symptoms of angina or decompensation.  He was without further palpitations and noted improvement in his dizziness.  BP was stable in the low 100s to 1 teens systolic.   He comes in accompanied by his wife today and is doing well, without symptoms concerning for angina or decompensation.  He does note an occasional brief right-sided chest discomfort that does not feel like his prior angina.  He remains active at baseline without cardiac limitation.  At times, he does note some dizziness with associated low blood pressure.  Typically this improves when drinking liquids.  He is followed by nephrology and PCP for stable chronic hyponatremia.  No lower extremity swelling.  No falls or symptoms concerning for abnormal bleeding.  He has not needed any as needed metoprolol.   Labs independently reviewed: 10/2021 - potassium 4.9, BUN 17, serum creatinine 1.3, albumin 4.2, AST/ALT normal, TC 122, TG 83, HDL 38, LDL 67, Hgb 14.6, PLT 182  Past Medical History:  Diagnosis Date   Anxiety    BPH (benign prostatic hyperplasia)    CAD in native artery    Cataract    bilateral- Dec 2021 and jan 2022   GERD (gastroesophageal reflux disease)    Hypercholesterolemia    Hyperlipidemia    Hypertension     Past Surgical History:  Procedure Laterality Date   APPENDECTOMY     CATARACT EXTRACTION, BILATERAL     COLONOSCOPY     CORONARY STENT INTERVENTION N/A 01/04/2021   Procedure: CORONARY STENT INTERVENTION;  Surgeon: Nelva Bush, MD;  Location: [place] [facility];  Service: Cardiovascular;  Laterality: N/A;   RIGHT/LEFT HEART CATH AND CORONARY ANGIOGRAPHY Bilateral 01/04/2021   Procedure: RIGHT/LEFT HEART CATH AND CORONARY ANGIOGRAPHY;  Surgeon: Nelva Bush, MD;  Location: [place] [facility];  Service: Cardiovascular;  Laterality: Bilateral;   TONSILLECTOMY      Current Medications: Current Meds  Medication Sig   aspirin EC 81 MG EC tablet Take 1  tablet (81 mg total) by mouth daily. Swallow whole.   atorvastatin (LIPITOR) 40 MG tablet Take 1 tablet (40 mg total) by mouth daily.   Bacillus Coagulans-Inulin (PROBIOTIC) 1-250 BILLION-MG CAPS Take 1 capsule by mouth daily.   clopidogrel (PLAVIX) 75 MG tablet Take 1 tablet (75 mg total) by mouth daily.   Cyanocobalamin (VITAMIN B 12 PO) Take 1,000 mcg by mouth daily at 12 noon.   erythromycin ophthalmic ointment Place 1 application into both eyes at bedtime.   primidone (MYSOLINE) 50 MG tablet Take 50 mg by mouth at bedtime.   Propylene Glycol (SYSTANE COMPLETE OP) Place 1 drop into both eyes in the morning, at noon, and at bedtime.   RESTASIS 0.05 % ophthalmic emulsion 1 drop 2 (two) times daily.   tamsulosin (FLOMAX) 0.4 MG CAPS capsule Take 0.4 mg by mouth daily after supper.    Allergies:   Patient has no known allergies.   Social History   Socioeconomic History   Marital status: Married    Spouse name: Not on file   Number of children: Not on file   Years of education: Not on file   Highest education level: Not on file  Occupational History  Not on file  Tobacco Use   Smoking status: Former    Types: Cigarettes    Quit date: 2012    Years since quitting: 11.5   Smokeless tobacco: Never  Vaping Use   Vaping Use: Never used  Substance and Sexual Activity   Alcohol use: Yes    Comment: once a month liquor   Drug use: Never   Sexual activity: Not Currently  Other Topics Concern   Not on file  Social History Narrative   Not on file   Social Determinants of Health   Financial Resource Strain: Not on file  Food Insecurity: Not on file  Transportation Needs: Not on file  Physical Activity: Not on file  Stress: Not on file  Social Connections: Not on file     Family History:  The patient's family history includes Alzheimer's disease in his father; Diabetes in his maternal grandmother.  ROS:   12-point review of systems is negative unless otherwise noted in the  HPI.   EKGs/Labs/Other Studies Reviewed:    Studies reviewed were summarized above. The additional studies were reviewed today:  Zio patch 03/2021: Patient had a min HR of 47 bpm, max HR of 174 bpm, and avg HR of 68 bpm. Predominant underlying rhythm was Sinus Rhythm. 17 Supraventricular Tachycardia runs occurred, the run with the fastest interval lasting 5 beats with a max rate of 174 bpm, the longest lasting 12 beats with an avg rate of 107 bpm. Isolated SVEs were occasional (1.2%, 15834), SVE Couplets were rare (<1.0%, 93), and SVE Triplets were rare (<1.0%, 22). Isolated VEs were rare (<1.0%, 10769), VE Couplets were rare (<1.0%, 11), and VE Triplets were rare (<1.0%, 1). Ventricular Bigeminy and Trigeminy were present.  __________   [facility] 12/2020: Conclusion Coronary artery disease including chronic total/subtotal occlusion of the mid RCA, and 30% proximal and mid LAD. Normal left ventricular end-diastolic pressure of 15 mmHg RA 8, RV 25/11, PCP WP 11, PA 29/10 (19) CO 6.77, CI 3.15 Successful PCI to the mid RCA with placement of a 3.5 x 22 mm Onyx DES with excellent angiographic result and TIMI-3 flow   Recommendation Dual antiplatelet therapy with aspirin and Plavix for 6 months, ideally longer Ongoing aggressive secondary prevention Admit overnight for monitoring.  Discharge in the morning. __________   2D echo 11/16/2020: 1. Left ventricular ejection fraction, by estimation, is 45 to 50%. The  left ventricle has mildly decreased function. The left ventricle  demonstrates regional wall motion abnormalities (see scoring  diagram/findings for description). There is mild left  ventricular hypertrophy. Left ventricular diastolic parameters are  consistent with Grade I diastolic dysfunction (impaired relaxation). There  is dyskinesis of the left ventricular, basal-mid inferolateral wall and  inferior wall.   2. Right ventricular systolic function is normal. The right ventricular   size is normal.   3. The mitral valve was not well visualized. Trivial mitral valve  regurgitation.   4. The aortic valve was not well visualized. Aortic valve regurgitation  is not visualized. No aortic stenosis is present. __________   [facility] MPI 11/17/2020: The study is normal. This is a low risk study. The left ventricular ejection fraction is hyperdynamic (>65%). There is no evidence for ischemia   EKG:  EKG is ordered today.  The EKG ordered today demonstrates NSR, 68 bpm, rare PAC, baseline artifact, no acute ST-T changes  Recent Labs: 06/17/2021: ALT 23; BUN 25; BUN 25; Creatinine, Ser 1.30; Creatinine, Ser 1.29; Hemoglobin 14.6; Platelets 182; Potassium  4.0; Potassium 3.9; Sodium 131; Sodium 129  Recent Lipid Panel    Component Value Date/Time   CHOL 116 01/27/2021 0927   TRIG 99 01/27/2021 0927   HDL 36 (L) 01/27/2021 0927   CHOLHDL 3.2 01/27/2021 0927   VLDL 20 01/27/2021 0927   LDLCALC 60 01/27/2021 0927    PHYSICAL EXAM:    VS:  BP 114/70   Pulse 68   Ht '5\' 11"'$  (1.803 m)   Wt 204 lb 6.4 oz (92.7 kg)   SpO2 97%   BMI 28.51 kg/m   BMI: Body mass index is 28.51 kg/m.  Physical Exam Vitals reviewed.  Constitutional:      Appearance: He is well-developed.  HENT:     Head: Normocephalic and atraumatic.  Eyes:     General:        Right eye: No discharge.        Left eye: No discharge.  Neck:     Vascular: No JVD.  Cardiovascular:     Rate and Rhythm: Normal rate and regular rhythm.     Pulses:          Posterior tibial pulses are 2+ on the right side and 2+ on the left side.     Heart sounds: Normal heart sounds, S1 normal and S2 normal. Heart sounds not distant. No midsystolic click and no opening snap. No murmur heard.    No friction rub.  Pulmonary:     Effort: Pulmonary effort is normal. No respiratory distress.     Breath sounds: Normal breath sounds. No decreased breath sounds, wheezing or rales.  Chest:     Chest wall: No tenderness.   Abdominal:     General: There is no distension.  Musculoskeletal:     Cervical back: Normal range of motion.     Right lower leg: No edema.     Left lower leg: No edema.  Skin:    General: Skin is warm and dry.     Nails: There is no clubbing.  Neurological:     Mental Status: He is alert and oriented to person, place, and time.  Psychiatric:        Speech: Speech normal.        Behavior: Behavior normal.        Thought Content: Thought content normal.        Judgment: Judgment normal.     Wt Readings from Last 3 Encounters:  11/24/21 204 lb 6.4 oz (92.7 kg)  06/24/21 212 lb 1.6 oz (96.2 kg)  05/27/21 212 lb (96.2 kg)     ASSESSMENT & PLAN:   CAD involving the native coronary arteries without angina: He continues to do well without symptoms concerning for angina or decompensation.  Continue aspirin and clopidogrel along with aggressive risk factor modification and secondary prevention including atorvastatin.  He is not on a scheduled beta-blocker secondary to relative hypotension.  No indication for further ischemic testing at this time.  Systolic dysfunction: He appears euvolemic and well compensated.  His weight is down 8 pounds today when compared to his last clinic visit.  He is no longer on scheduled metoprolol secondary to relative hypotension.  Escalation of GDMT limited by blood pressure.  Not on MRA secondary to underlying renal dysfunction.  Would avoid standing diuretic given chronic stable hyponatremia.  Palpitations/PSVT: Quiescent.  He remains on as needed metoprolol, which she has not needed.  Hypotension: Blood pressure currently stable.  CKD stage IIIa: Followed by nephrology.  HLD:  LDL 60 in 01/2021.  He remains on atorvastatin.   Disposition: F/u with Dr. Saunders Revel or an APP in 6 months.   Medication Adjustments/Labs and Tests Ordered: Current medicines are reviewed at length with the patient today.  Concerns regarding medicines are outlined above. Medication  changes, Labs and Tests ordered today are summarized above and listed in the Patient Instructions accessible in Encounters.   Signed, Christell Faith, PA-C 11/24/2021 10:00 AM     [place] [address], [place] [address] [phone]

## 2021-11-24 NOTE — Patient Instructions (Signed)
Medication Instructions:  No changes at this time.   *If you need a refill on your cardiac medications before your next appointment, please call your pharmacy*   Lab Work: None  If you have labs (blood work) drawn today and your tests are completely normal, you will receive your results only by: MyChart Message (if you have MyChart) OR A paper copy in the mail If you have any lab test that is abnormal or we need to change your treatment, we will call you to review the results.   Testing/Procedures: None   Follow-Up: At [facility], you and your health needs are our priority.  As part of our continuing mission to provide you with exceptional heart care, we have created designated Provider Care Teams.  These Care Teams include your primary Cardiologist (physician) and Advanced Practice Providers (APPs -  Physician Assistants and Nurse Practitioners) who all work together to provide you with the care you need, when you need it.   Your next appointment:   6 month(s)  The format for your next appointment:   In Person  Provider:   Christopher End, MD or Ryan Dunn, PA-C        Important Information About Sugar       

## 2021-11-30 DIAGNOSIS — H5340 Unspecified visual field defects: Secondary | ICD-10-CM | POA: Diagnosis not present

## 2021-11-30 DIAGNOSIS — H16223 Keratoconjunctivitis sicca, not specified as Sjogren's, bilateral: Secondary | ICD-10-CM | POA: Diagnosis not present

## 2021-12-05 ENCOUNTER — Other Ambulatory Visit: Payer: Self-pay

## 2021-12-05 ENCOUNTER — Telehealth: Payer: Self-pay | Admitting: *Deleted

## 2021-12-05 DIAGNOSIS — D472 Monoclonal gammopathy: Secondary | ICD-10-CM

## 2021-12-05 NOTE — Telephone Encounter (Signed)
I am ok doing those. Please let pt know

## 2021-12-05 NOTE — Telephone Encounter (Signed)
Pts wife is aware we will be drawing BMP for Dr. Minerva Areola office on 12/22/21. She agrees and understands.

## 2021-12-05 NOTE — Telephone Encounter (Signed)
Per Dr. Janese Banks she is okay with our office drawing a BMP for the visit on 12/22/2021. Pts wife is aware that lab will be drawn.

## 2021-12-05 NOTE — Telephone Encounter (Signed)
Call from patient wanting to know if the labs for [facility] (Dr Minerva Areola) are be/ing drawn with our /labs when he comes in on 12/22/21

## 2021-12-08 ENCOUNTER — Other Ambulatory Visit: Payer: Self-pay | Admitting: Physician Assistant

## 2021-12-22 ENCOUNTER — Inpatient Hospital Stay: Payer: Medicare PPO | Attending: Oncology

## 2021-12-22 DIAGNOSIS — N400 Enlarged prostate without lower urinary tract symptoms: Secondary | ICD-10-CM | POA: Diagnosis not present

## 2021-12-22 DIAGNOSIS — D472 Monoclonal gammopathy: Secondary | ICD-10-CM | POA: Insufficient documentation

## 2021-12-22 LAB — RENAL FUNCTION PANEL
Albumin: 4.3 g/dL (ref 3.5–5.0)
Anion gap: 6 (ref 5–15)
BUN: 19 mg/dL (ref 8–23)
CO2: 27 mmol/L (ref 22–32)
Calcium: 9.1 mg/dL (ref 8.9–10.3)
Chloride: 101 mmol/L (ref 98–111)
Creatinine, Ser: 1.44 mg/dL — ABNORMAL HIGH (ref 0.61–1.24)
GFR, Estimated: 50 mL/min — ABNORMAL LOW (ref >60)
Glucose, Bld: 124 mg/dL — ABNORMAL HIGH (ref 70–99)
Phosphorus: 2.9 mg/dL (ref 2.5–4.6)
Potassium: 3.9 mmol/L (ref 3.5–5.1)
Sodium: 134 mmol/L — ABNORMAL LOW (ref 135–145)

## 2021-12-22 LAB — COMPREHENSIVE METABOLIC PANEL
ALT: 17 U/L (ref 0–44)
AST: 19 U/L (ref 15–41)
Albumin: 4.2 g/dL (ref 3.5–5.0)
Alkaline Phosphatase: 64 U/L (ref 38–126)
Anion gap: 5 (ref 5–15)
BUN: 20 mg/dL (ref 8–23)
CO2: 28 mmol/L (ref 22–32)
Calcium: 9.3 mg/dL (ref 8.9–10.3)
Chloride: 102 mmol/L (ref 98–111)
Creatinine, Ser: 1.39 mg/dL — ABNORMAL HIGH (ref 0.61–1.24)
GFR, Estimated: 52 mL/min — ABNORMAL LOW (ref >60)
Glucose, Bld: 125 mg/dL — ABNORMAL HIGH (ref 70–99)
Potassium: 4.1 mmol/L (ref 3.5–5.1)
Sodium: 135 mmol/L (ref 135–145)
Total Bilirubin: 0.9 mg/dL (ref 0.3–1.2)
Total Protein: 8 g/dL (ref 6.5–8.1)

## 2021-12-22 LAB — CBC WITH DIFFERENTIAL/PLATELET
Abs Immature Granulocytes: 0.02 10*3/uL (ref 0.00–0.07)
Basophils Absolute: 0 10*3/uL (ref 0.0–0.1)
Basophils Relative: 0 %
Eosinophils Absolute: 0.2 10*3/uL (ref 0.0–0.5)
Eosinophils Relative: 4 %
HCT: 47.5 % (ref 39.0–52.0)
Hemoglobin: 15.7 g/dL (ref 13.0–17.0)
Immature Granulocytes: 0 %
Lymphocytes Relative: 17 %
Lymphs Abs: 0.9 10*3/uL (ref 0.7–4.0)
MCH: 31.5 pg (ref 26.0–34.0)
MCHC: 33.1 g/dL (ref 30.0–36.0)
MCV: 95.2 fL (ref 80.0–100.0)
Monocytes Absolute: 0.7 10*3/uL (ref 0.1–1.0)
Monocytes Relative: 12 %
Neutro Abs: 3.6 10*3/uL (ref 1.7–7.7)
Neutrophils Relative %: 67 %
Platelets: 174 10*3/uL (ref 150–400)
RBC: 4.99 MIL/uL (ref 4.22–5.81)
RDW: 13.1 % (ref 11.5–15.5)
WBC: 5.4 10*3/uL (ref 4.0–10.5)
nRBC: 0 % (ref 0.0–0.2)

## 2021-12-22 LAB — PSA: Prostatic Specific Antigen: 2.31 ng/mL (ref 0.00–4.00)

## 2021-12-23 ENCOUNTER — Other Ambulatory Visit: Payer: Self-pay | Admitting: Physician Assistant

## 2021-12-23 LAB — KAPPA/LAMBDA LIGHT CHAINS
Kappa free light chain: 147.8 mg/L — ABNORMAL HIGH (ref 3.3–19.4)
Kappa, lambda light chain ratio: 14.21 — ABNORMAL HIGH (ref 0.26–1.65)
Lambda free light chains: 10.4 mg/L (ref 5.7–26.3)

## 2021-12-24 LAB — PARATHYROID HORMONE, INTACT (NO CA): PTH: 25 pg/mL (ref 15–65)

## 2021-12-26 LAB — MULTIPLE MYELOMA PANEL, SERUM
Albumin SerPl Elph-Mcnc: 3.9 g/dL (ref 2.9–4.4)
Albumin/Glob SerPl: 1.2 (ref 0.7–1.7)
Alpha 1: 0.2 g/dL (ref 0.0–0.4)
Alpha2 Glob SerPl Elph-Mcnc: 0.6 g/dL (ref 0.4–1.0)
B-Globulin SerPl Elph-Mcnc: 0.7 g/dL (ref 0.7–1.3)
Gamma Glob SerPl Elph-Mcnc: 1.9 g/dL — ABNORMAL HIGH (ref 0.4–1.8)
Globulin, Total: 3.4 g/dL (ref 2.2–3.9)
IgA: 66 mg/dL (ref 61–437)
IgG (Immunoglobin G), Serum: 2144 mg/dL — ABNORMAL HIGH (ref 603–1613)
IgM (Immunoglobulin M), Srm: 18 mg/dL (ref 15–143)
M Protein SerPl Elph-Mcnc: 1.5 g/dL — ABNORMAL HIGH
Total Protein ELP: 7.3 g/dL (ref 6.0–8.5)

## 2022-02-07 DIAGNOSIS — H538 Other visual disturbances: Secondary | ICD-10-CM | POA: Diagnosis not present

## 2022-02-07 DIAGNOSIS — H26493 Other secondary cataract, bilateral: Secondary | ICD-10-CM | POA: Diagnosis not present

## 2022-02-13 DIAGNOSIS — N1831 Chronic kidney disease, stage 3a: Secondary | ICD-10-CM | POA: Diagnosis not present

## 2022-02-28 DIAGNOSIS — H43813 Vitreous degeneration, bilateral: Secondary | ICD-10-CM | POA: Diagnosis not present

## 2022-02-28 DIAGNOSIS — H35361 Drusen (degenerative) of macula, right eye: Secondary | ICD-10-CM | POA: Diagnosis not present

## 2022-02-28 DIAGNOSIS — H26492 Other secondary cataract, left eye: Secondary | ICD-10-CM | POA: Diagnosis not present

## 2022-02-28 DIAGNOSIS — R809 Proteinuria, unspecified: Secondary | ICD-10-CM | POA: Diagnosis not present

## 2022-02-28 DIAGNOSIS — N182 Chronic kidney disease, stage 2 (mild): Secondary | ICD-10-CM | POA: Diagnosis not present

## 2022-02-28 DIAGNOSIS — E871 Hypo-osmolality and hyponatremia: Secondary | ICD-10-CM | POA: Diagnosis not present

## 2022-03-23 DIAGNOSIS — H35342 Macular cyst, hole, or pseudohole, left eye: Secondary | ICD-10-CM | POA: Diagnosis not present

## 2022-04-11 DIAGNOSIS — H6123 Impacted cerumen, bilateral: Secondary | ICD-10-CM | POA: Diagnosis not present

## 2022-04-28 DIAGNOSIS — H6983 Other specified disorders of Eustachian tube, bilateral: Secondary | ICD-10-CM | POA: Diagnosis not present

## 2022-04-28 DIAGNOSIS — H903 Sensorineural hearing loss, bilateral: Secondary | ICD-10-CM | POA: Diagnosis not present

## 2022-05-09 DIAGNOSIS — J069 Acute upper respiratory infection, unspecified: Secondary | ICD-10-CM | POA: Diagnosis not present

## 2022-05-09 DIAGNOSIS — Z03818 Encounter for observation for suspected exposure to other biological agents ruled out: Secondary | ICD-10-CM | POA: Diagnosis not present

## 2022-05-09 DIAGNOSIS — H6993 Unspecified Eustachian tube disorder, bilateral: Secondary | ICD-10-CM | POA: Diagnosis not present

## 2022-05-22 ENCOUNTER — Other Ambulatory Visit: Payer: Self-pay

## 2022-05-22 ENCOUNTER — Telehealth: Payer: Self-pay | Admitting: Internal Medicine

## 2022-05-22 MED ORDER — CLOPIDOGREL BISULFATE 75 MG PO TABS: ORAL_TABLET | ORAL | 4 refills | Status: DC

## 2022-05-22 NOTE — Telephone Encounter (Signed)
Disp Refills Start End   clopidogrel (PLAVIX) 75 MG tablet 30 tablet 4 05/22/2022    Sig: TAKE 1 TABLET(75 MG) BY MOUTH [place], [place] [address] AT [place]

## 2022-05-22 NOTE — Telephone Encounter (Signed)
*  STAT* If patient is at the pharmacy, call can be transferred to refill team.   1. Which medications need to be refilled? (please list name of each medication and dose if known) clopidogrel (PLAVIX) 75 MG tablet   2. Which pharmacy/location (including street and city if local pharmacy) is medication to be sent to?  [place], [place] [address] AT [place]    3. Do they need a 30 day or 90 day supply? 90 day supply

## 2022-05-23 ENCOUNTER — Other Ambulatory Visit: Payer: Self-pay | Admitting: Physician Assistant

## 2022-05-23 DIAGNOSIS — R251 Tremor, unspecified: Secondary | ICD-10-CM | POA: Diagnosis not present

## 2022-05-23 DIAGNOSIS — I2510 Atherosclerotic heart disease of native coronary artery without angina pectoris: Secondary | ICD-10-CM | POA: Diagnosis not present

## 2022-05-23 DIAGNOSIS — N1831 Chronic kidney disease, stage 3a: Secondary | ICD-10-CM | POA: Diagnosis not present

## 2022-05-23 DIAGNOSIS — E871 Hypo-osmolality and hyponatremia: Secondary | ICD-10-CM | POA: Diagnosis not present

## 2022-05-23 DIAGNOSIS — N400 Enlarged prostate without lower urinary tract symptoms: Secondary | ICD-10-CM | POA: Diagnosis not present

## 2022-05-23 DIAGNOSIS — K219 Gastro-esophageal reflux disease without esophagitis: Secondary | ICD-10-CM | POA: Diagnosis not present

## 2022-05-23 DIAGNOSIS — E7800 Pure hypercholesterolemia, unspecified: Secondary | ICD-10-CM | POA: Diagnosis not present

## 2022-05-23 DIAGNOSIS — D472 Monoclonal gammopathy: Secondary | ICD-10-CM | POA: Diagnosis not present

## 2022-05-23 DIAGNOSIS — M79674 Pain in right toe(s): Secondary | ICD-10-CM | POA: Diagnosis not present

## 2022-05-31 ENCOUNTER — Other Ambulatory Visit: Payer: Self-pay | Admitting: Physician Assistant

## 2022-06-01 DIAGNOSIS — H5340 Unspecified visual field defects: Secondary | ICD-10-CM | POA: Diagnosis not present

## 2022-06-01 DIAGNOSIS — H16223 Keratoconjunctivitis sicca, not specified as Sjogren's, bilateral: Secondary | ICD-10-CM | POA: Diagnosis not present

## 2022-06-16 ENCOUNTER — Ambulatory Visit: Payer: Medicare PPO | Admitting: Internal Medicine

## 2022-06-17 NOTE — Progress Notes (Unsigned)
Cardiology Office Note    Date:  06/21/2022   ID:  Tuck, Dulworth 1945/08/17, MRN [id number]  PCP:  Sofie Hartigan, MD  Cardiologist:  Nelva Bush, MD  Electrophysiologist:  None   Chief Complaint: Follow-up  History of Present Illness:   Steve Warren is a 77 y.o. male with history of CAD status post PCI to the RCA on 01/04/2021, aortic atherosclerosis, systolic dysfunction, pSVT, CKD stage IIIa, MGUS, essential tremor, chronic dizziness, HLD, BPH, and GERD who presents for follow up of follow-up of CAD.   He was admitted to [facility] in 11/2020 with a 4-day history of intermittent chest discomfort described as a sharp sensation in the center of his chest that radiated to his back and seemed to be associated primarily with activity, though also wondered if eating precipitated it.  There was some shortness of breath when the pain occurred.  High-sensitivity troponin peaked at 86.  BNP 112.  EKG demonstrated sinus rhythm with no acute ischemic ST-T changes. Chest x-ray showed low lung volumes without acute abnormality.  CTA chest/abdomen/pelvis showed no acute vascular abnormality with aortic atherosclerosis as well as at least two-vessel coronary artery calcifications, stable pulmonary nodules, colonic diverticulosis without acute diverticulitis, and severe degenerative changes within the lower lumbar spine noted.  Echo showed an EF of 45 to 50%, mild LVH, dyskinesis of the left ventricular, basal mid inferolateral wall, and inferior wall, grade 1 diastolic dysfunction, normal RV systolic function and ventricular cavity size, and trivial mitral regurgitation.  Lexiscan MPI showed no evidence of significant ischemia and was overall low risk.   He was seen in follow-up later in 11/2020 and was doing reasonably well from a cardiac perspective.  He did continue to note randomly occurring intermittent episodes of chest discomfort that were not as severe as what he experienced leading to his  hospital admission.  It was noted he was experiencing episodes of hypotension following transition from Toprol to carvedilol by outside office.  In this setting, he was transitioned back to Toprol.  Given his stable to improving symptoms, continued medical therapy was recommended.  However, we received a message later that month that he was experiencing worsening exertional dyspnea and angina that improved with rest.  In this setting, he underwent diagnostic R/LHC on 01/04/2021 which demonstrated chronic total/subtotal occlusion of the mid RCA as well as 30% proximal and mid LAD stenoses.  He underwent successful PCI to the mid RCA.  Normal LVEDP.   He was seen in hospital follow-up in 01/2021 and was doing well from a cardiac perspective.  Following his PCI, he was experiencing some dizziness.  Upon reviewing his medications, he was taking a full 25 mg of Toprol-XL and had associated hypotension.  Following decrease of Toprol to the previously recommended 12.5 mg his BP had improved with resolution of dizziness.  Following PCI, he noted resolution of exertional chest discomfort and dyspnea.  He was seen in the office on 04/13/2021 and continued to do well from a cardiac perspective.  It was noted he had been having some episodes of hypotension with home BP cuff readings.  He did note some intermittent positional dizziness.  He also noted several episodes of palpitations, though did not recall if these episodes occurred during episodes of dizziness.  Given episodes of hypotension, metoprolol was discontinued.  Subsequent Zio patch demonstrated a predominant rhythm of sinus with an average heart rate of 68 bpm (range 47 to 174 bpm), 17 episodes of SVT with  the fastest interval lasting 5 beats with a maximal rate of 174 bpm and the longest interval lasting 12 beats with an average rate of 107 bpm, occasional PACs, rare atrial couplets, atrial triplets, PVCs, ventricular couplets, and ventricular triplets.  He was seen  in the office in 05/2021 and was without symptoms of angina or decompensation.  He was without further palpitations and noted improvement in his dizziness.  BP was stable in the low 100s to 1 teens systolic.  He was last seen in the office in 11/2021 and remained without symptoms of angina or decompensation.  He did note an occasional brief right-sided chest discomfort that did not feel like his prior angina.  He remains active at baseline, without cardiac limitation.  He comes in accompanied by his wife today and is doing well from a cardiac perspective.  No symptoms of angina or cardiac decompensation.  He does continue to note randomly occurring split-second lasting right-sided sharp chest discomfort that does not feel like his prior angina.  He also continues to note fluctuations in BP ranging from 36R to 443X systolic.  Due to fluctuations in blood pressure, he has not needed or taken any as needed metoprolol.  With this, there is some continued dizziness without frank syncope.  He is trying to stay hydrated with water and Gatorade.  He also notes his stress level has been higher than baseline.  In this setting, he has noted worsening of his tremor.  He has been referred to neurology by outside office for further evaluation of this.  No falls or symptoms concerning for bleeding.   Labs independently reviewed: 05/2022 - potassium 4.5, BUN 21, serum creatinine 1.3, albumin 4.2, AST/ALT normal, TC 124, TG 78, HDL 39, LDL 69 02/2022 - Hgb 15.8, PLT 198  Past Medical History:  Diagnosis Date   Anxiety    BPH (benign prostatic hyperplasia)    CAD in native artery    Cataract    bilateral- Dec 2021 and jan 2022   GERD (gastroesophageal reflux disease)    Hypercholesterolemia    Hyperlipidemia    Hypertension     Past Surgical History:  Procedure Laterality Date   APPENDECTOMY     CATARACT EXTRACTION, BILATERAL     COLONOSCOPY     CORONARY STENT INTERVENTION N/A 01/04/2021   Procedure: CORONARY  STENT INTERVENTION;  Surgeon: Nelva Bush, MD;  Location: [facility];  Service: Cardiovascular;  Laterality: N/A;   RIGHT/LEFT HEART CATH AND CORONARY ANGIOGRAPHY Bilateral 01/04/2021   Procedure: RIGHT/LEFT HEART CATH AND CORONARY ANGIOGRAPHY;  Surgeon: Nelva Bush, MD;  Location: [facility];  Service: Cardiovascular;  Laterality: Bilateral;   TONSILLECTOMY      Current Medications: Current Meds  Medication Sig   aspirin EC 81 MG EC tablet Take 1 tablet (81 mg total) by mouth daily. Swallow whole.   atorvastatin (LIPITOR) 40 MG tablet TAKE 1 TABLET(40 MG) BY MOUTH DAILY   Bacillus Coagulans-Inulin (PROBIOTIC) 1-250 BILLION-MG CAPS Take 1 capsule by mouth daily.   clopidogrel (PLAVIX) 75 MG tablet TAKE 1 TABLET(75 MG) BY MOUTH DAILY   Cyanocobalamin (VITAMIN B 12 PO) Take 1,000 mcg by mouth daily at 12 noon.   primidone (MYSOLINE) 50 MG tablet Take 50 mg by mouth at bedtime.   Propylene Glycol (SYSTANE COMPLETE OP) Place 1 drop into both eyes in the morning, at noon, and at bedtime.   RESTASIS 0.05 % ophthalmic emulsion 1 drop 2 (two) times daily.   tamsulosin (FLOMAX)  0.4 MG CAPS capsule Take 0.4 mg by mouth daily after supper.    Allergies:   Patient has no known allergies.   Social History   Socioeconomic History   Marital status: Married    Spouse name: Not on file   Number of children: Not on file   Years of education: Not on file   Highest education level: Not on file  Occupational History   Not on file  Tobacco Use   Smoking status: Former    Types: Cigarettes    Quit date: 2012    Years since quitting: 12.1   Smokeless tobacco: Never  Vaping Use   Vaping Use: Never used  Substance and Sexual Activity   Alcohol use: Yes    Comment: once a month liquor   Drug use: Never   Sexual activity: Not Currently  Other Topics Concern   Not on file  Social History Narrative   Not on file   Social Determinants of Health   Financial Resource  Strain: Not on file  Food Insecurity: Not on file  Transportation Needs: Not on file  Physical Activity: Not on file  Stress: Not on file  Social Connections: Not on file     Family History:  The patient's family history includes Alzheimer's disease in his father; Diabetes in his maternal grandmother.  ROS:   12-point review of systems is negative unless otherwise noted in the HPI.   EKGs/Labs/Other Studies Reviewed:    Studies reviewed were summarized above. The additional studies were reviewed today:  Zio patch 03/2021: Patient had a min HR of 47 bpm, max HR of 174 bpm, and avg HR of 68 bpm. Predominant underlying rhythm was Sinus Rhythm. 17 Supraventricular Tachycardia runs occurred, the run with the fastest interval lasting 5 beats with a max rate of 174 bpm, the longest lasting 12 beats with an avg rate of 107 bpm. Isolated SVEs were occasional (1.2%, 15834), SVE Couplets were rare (<1.0%, 93), and SVE Triplets were rare (<1.0%, 22). Isolated VEs were rare (<1.0%, 10769), VE Couplets were rare (<1.0%, 11), and VE Triplets were rare (<1.0%, 1). Ventricular Bigeminy and Trigeminy were present.  __________   [facility] 12/2020: Conclusion Coronary artery disease including chronic total/subtotal occlusion of the mid RCA, and 30% proximal and mid LAD. Normal left ventricular end-diastolic pressure of 15 mmHg RA 8, RV 25/11, PCP WP 11, PA 29/10 (19) CO 6.77, CI 3.15 Successful PCI to the mid RCA with placement of a 3.5 x 22 mm Onyx DES with excellent angiographic result and TIMI-3 flow   Recommendation Dual antiplatelet therapy with aspirin and Plavix for 6 months, ideally longer Ongoing aggressive secondary prevention Admit overnight for monitoring.  Discharge in the morning. __________   2D echo 11/16/2020: 1. Left ventricular ejection fraction, by estimation, is 45 to 50%. The  left ventricle has mildly decreased function. The left ventricle  demonstrates regional wall motion  abnormalities (see scoring  diagram/findings for description). There is mild left  ventricular hypertrophy. Left ventricular diastolic parameters are  consistent with Grade I diastolic dysfunction (impaired relaxation). There  is dyskinesis of the left ventricular, basal-mid inferolateral wall and  inferior wall.   2. Right ventricular systolic function is normal. The right ventricular  size is normal.   3. The mitral valve was not well visualized. Trivial mitral valve  regurgitation.   4. The aortic valve was not well visualized. Aortic valve regurgitation  is not visualized. No aortic stenosis is present. __________  Lexiscan MPI 11/17/2020: The study is normal. This is a low risk study. The left ventricular ejection fraction is hyperdynamic (>65%). There is no evidence for ischemia    EKG:  EKG is ordered today.  The EKG ordered today demonstrates NSR, 65 bpm, no acute ST-T changes  Recent Labs: 12/22/2021: ALT 17; BUN 19; BUN 20; Creatinine, Ser 1.44; Creatinine, Ser 1.39; Hemoglobin 15.7; Platelets 174; Potassium 3.9; Potassium 4.1; Sodium 134; Sodium 135  Recent Lipid Panel    Component Value Date/Time   CHOL 116 01/27/2021 0927   TRIG 99 01/27/2021 0927   HDL 36 (L) 01/27/2021 0927   CHOLHDL 3.2 01/27/2021 0927   VLDL 20 01/27/2021 0927   LDLCALC 60 01/27/2021 0927    PHYSICAL EXAM:    VS:  BP (!) 140/80 (BP Location: Left Arm, Patient Position: Sitting, Cuff Size: Normal)   Pulse 65   Ht '5\' 11"'$  (1.803 m)   Wt 215 lb (97.5 kg)   SpO2 100%   BMI 29.99 kg/m   BMI: Body mass index is 29.99 kg/m.  Physical Exam Vitals reviewed.  Constitutional:      Appearance: He is well-developed.  HENT:     Head: Normocephalic and atraumatic.  Eyes:     General:        Right eye: No discharge.        Left eye: No discharge.  Neck:     Vascular: No JVD.  Cardiovascular:     Rate and Rhythm: Normal rate and regular rhythm.     Heart sounds: Normal heart sounds, S1  normal and S2 normal. Heart sounds not distant. No midsystolic click and no opening snap. No murmur heard.    No friction rub.  Pulmonary:     Effort: Pulmonary effort is normal. No respiratory distress.     Breath sounds: Normal breath sounds. No decreased breath sounds, wheezing or rales.  Chest:     Chest wall: No tenderness.  Abdominal:     General: There is no distension.  Musculoskeletal:     Cervical back: Normal range of motion.     Right lower leg: No edema.     Left lower leg: No edema.  Skin:    General: Skin is warm and dry.     Nails: There is no clubbing.  Neurological:     Mental Status: He is alert and oriented to person, place, and time.  Psychiatric:        Speech: Speech normal.        Behavior: Behavior normal.        Thought Content: Thought content normal.        Judgment: Judgment normal.     Wt Readings from Last 3 Encounters:  06/21/22 215 lb (97.5 kg)  11/24/21 204 lb 6.4 oz (92.7 kg)  06/24/21 212 lb 1.6 oz (96.2 kg)     ASSESSMENT & PLAN:   CAD involving native coronary arteries without angina: He continues to do well and is without symptoms of angina or decompensation.  Continue aggressive risk factor modification and secondary prevention including aspirin, clopidogrel, and atorvastatin.  He is not on beta-blocker secondary to relative hypotension in the setting of labile hypertension.  No indication for further ischemic testing at this time.  Systolic dysfunction: He is euvolemic and well compensated.  GDMT has been limited secondary to relative hypotension and underlying renal dysfunction.  Recommend avoiding standing diuretic given chronic stable hyponatremia.  Palpitations/PSVT: Quiescent.  He has not needed any  as needed metoprolol.  Labile hypertension: Blood pressure readings at home typically range from the 20U to 015I systolic with associated intermittent dizziness.  We will place a 24-hour ambulatory BP monitor for further evaluation of  his blood pressure trends with close evaluation of symptoms associated with readings.  CKD stage IIIa: Followed by nephrology.  HLD: LDL 69.  He remains on atorvastatin.  Tremor: [facility] has referred the patient to neurology.   Disposition: F/u with Dr. Saunders Revel or an APP in 2 months.   Medication Adjustments/Labs and Tests Ordered: Current medicines are reviewed at length with the patient today.  Concerns regarding medicines are outlined above. Medication changes, Labs and Tests ordered today are summarized above and listed in the Patient Instructions accessible in Encounters.   Signed, Christell Faith, PA-C 06/21/2022 9:52 AM     [facility] [address] [place] [address] [place], [place] [address] [phone]

## 2022-06-20 DIAGNOSIS — H538 Other visual disturbances: Secondary | ICD-10-CM | POA: Diagnosis not present

## 2022-06-21 ENCOUNTER — Other Ambulatory Visit: Payer: Self-pay | Admitting: *Deleted

## 2022-06-21 ENCOUNTER — Inpatient Hospital Stay: Payer: Medicare PPO | Attending: Oncology

## 2022-06-21 ENCOUNTER — Encounter: Payer: Self-pay | Admitting: Physician Assistant

## 2022-06-21 ENCOUNTER — Ambulatory Visit: Payer: Medicare PPO | Attending: Internal Medicine | Admitting: Physician Assistant

## 2022-06-21 VITALS — BP 140/80 | HR 65 | Ht 71.0 in | Wt 215.0 lb

## 2022-06-21 DIAGNOSIS — N183 Chronic kidney disease, stage 3 unspecified: Secondary | ICD-10-CM | POA: Diagnosis not present

## 2022-06-21 DIAGNOSIS — D472 Monoclonal gammopathy: Secondary | ICD-10-CM | POA: Insufficient documentation

## 2022-06-21 DIAGNOSIS — Z87891 Personal history of nicotine dependence: Secondary | ICD-10-CM | POA: Diagnosis not present

## 2022-06-21 DIAGNOSIS — I129 Hypertensive chronic kidney disease with stage 1 through stage 4 chronic kidney disease, or unspecified chronic kidney disease: Secondary | ICD-10-CM | POA: Diagnosis not present

## 2022-06-21 DIAGNOSIS — I519 Heart disease, unspecified: Secondary | ICD-10-CM | POA: Diagnosis not present

## 2022-06-21 DIAGNOSIS — I471 Supraventricular tachycardia, unspecified: Secondary | ICD-10-CM

## 2022-06-21 DIAGNOSIS — R251 Tremor, unspecified: Secondary | ICD-10-CM

## 2022-06-21 DIAGNOSIS — E785 Hyperlipidemia, unspecified: Secondary | ICD-10-CM

## 2022-06-21 DIAGNOSIS — I2510 Atherosclerotic heart disease of native coronary artery without angina pectoris: Secondary | ICD-10-CM

## 2022-06-21 DIAGNOSIS — R0989 Other specified symptoms and signs involving the circulatory and respiratory systems: Secondary | ICD-10-CM

## 2022-06-21 DIAGNOSIS — N1831 Chronic kidney disease, stage 3a: Secondary | ICD-10-CM | POA: Diagnosis not present

## 2022-06-21 LAB — CBC WITH DIFFERENTIAL/PLATELET
Abs Immature Granulocytes: 0.05 10*3/uL (ref 0.00–0.07)
Basophils Absolute: 0 10*3/uL (ref 0.0–0.1)
Basophils Relative: 1 %
Eosinophils Absolute: 0.1 10*3/uL (ref 0.0–0.5)
Eosinophils Relative: 2 %
HCT: 46.7 % (ref 39.0–52.0)
Hemoglobin: 14.9 g/dL (ref 13.0–17.0)
Immature Granulocytes: 1 %
Lymphocytes Relative: 12 %
Lymphs Abs: 0.8 10*3/uL (ref 0.7–4.0)
MCH: 30.4 pg (ref 26.0–34.0)
MCHC: 31.9 g/dL (ref 30.0–36.0)
MCV: 95.3 fL (ref 80.0–100.0)
Monocytes Absolute: 0.9 10*3/uL (ref 0.1–1.0)
Monocytes Relative: 13 %
Neutro Abs: 5 10*3/uL (ref 1.7–7.7)
Neutrophils Relative %: 71 %
Platelets: 186 10*3/uL (ref 150–400)
RBC: 4.9 MIL/uL (ref 4.22–5.81)
RDW: 13 % (ref 11.5–15.5)
WBC: 6.9 10*3/uL (ref 4.0–10.5)
nRBC: 0 % (ref 0.0–0.2)

## 2022-06-21 LAB — COMPREHENSIVE METABOLIC PANEL
ALT: 22 U/L (ref 0–44)
AST: 23 U/L (ref 15–41)
Albumin: 4.2 g/dL (ref 3.5–5.0)
Alkaline Phosphatase: 71 U/L (ref 38–126)
Anion gap: 7 (ref 5–15)
BUN: 22 mg/dL (ref 8–23)
CO2: 25 mmol/L (ref 22–32)
Calcium: 8.7 mg/dL — ABNORMAL LOW (ref 8.9–10.3)
Chloride: 98 mmol/L (ref 98–111)
Creatinine, Ser: 1.22 mg/dL (ref 0.61–1.24)
GFR, Estimated: >60 mL/min (ref >60)
Glucose, Bld: 93 mg/dL (ref 70–99)
Potassium: 4 mmol/L (ref 3.5–5.1)
Sodium: 130 mmol/L — ABNORMAL LOW (ref 135–145)
Total Bilirubin: 0.8 mg/dL (ref 0.3–1.2)
Total Protein: 8.1 g/dL (ref 6.5–8.1)

## 2022-06-21 NOTE — Patient Instructions (Signed)
Medication Instructions:  No changes at this time.   *If you need a refill on your cardiac medications before your next appointment, please call your pharmacy*   Lab Work: None  If you have labs (blood work) drawn today and your tests are completely normal, you will receive your results only by: [place] (if you have MyChart) OR A paper copy in the mail If you have any lab test that is abnormal or we need to change your treatment, we will call you to review the results.   Testing/Procedures: Provider has requested a 24 hour blood pressure monitoring. We will be in touch with this information.   Follow-Up: At [facility], you and your health needs are our priority.  As part of our continuing mission to provide you with exceptional heart care, we have created designated Provider Care Teams.  These Care Teams include your primary Cardiologist (physician) and Advanced Practice Providers (APPs -  Physician Assistants and Nurse Practitioners) who all work together to provide you with the care you need, when you need it.   Your next appointment:   2 month(s)  Provider:   Nelva Bush, MD or Christell Faith, PA-C

## 2022-06-22 LAB — KAPPA/LAMBDA LIGHT CHAINS
Kappa free light chain: 104.5 mg/L — ABNORMAL HIGH (ref 3.3–19.4)
Kappa, lambda light chain ratio: 11.24 — ABNORMAL HIGH (ref 0.26–1.65)
Lambda free light chains: 9.3 mg/L (ref 5.7–26.3)

## 2022-06-27 ENCOUNTER — Other Ambulatory Visit: Payer: Medicare PPO

## 2022-06-27 DIAGNOSIS — H26491 Other secondary cataract, right eye: Secondary | ICD-10-CM | POA: Diagnosis not present

## 2022-06-28 LAB — MULTIPLE MYELOMA PANEL, SERUM
Albumin SerPl Elph-Mcnc: 3.9 g/dL (ref 2.9–4.4)
Albumin/Glob SerPl: 1.1 (ref 0.7–1.7)
Alpha 1: 0.2 g/dL (ref 0.0–0.4)
Alpha2 Glob SerPl Elph-Mcnc: 0.6 g/dL (ref 0.4–1.0)
B-Globulin SerPl Elph-Mcnc: 0.8 g/dL (ref 0.7–1.3)
Gamma Glob SerPl Elph-Mcnc: 1.9 g/dL — ABNORMAL HIGH (ref 0.4–1.8)
Globulin, Total: 3.6 g/dL (ref 2.2–3.9)
IgA: 65 mg/dL (ref 61–437)
IgG (Immunoglobin G), Serum: 2351 mg/dL — ABNORMAL HIGH (ref 603–1613)
IgM (Immunoglobulin M), Srm: 19 mg/dL (ref 15–143)
M Protein SerPl Elph-Mcnc: 1.4 g/dL — ABNORMAL HIGH
Total Protein ELP: 7.5 g/dL (ref 6.0–8.5)

## 2022-06-29 ENCOUNTER — Encounter: Payer: Self-pay | Admitting: Oncology

## 2022-06-30 ENCOUNTER — Other Ambulatory Visit: Payer: Self-pay | Admitting: *Deleted

## 2022-06-30 ENCOUNTER — Inpatient Hospital Stay: Payer: Medicare PPO | Admitting: Oncology

## 2022-06-30 ENCOUNTER — Encounter: Payer: Self-pay | Admitting: Oncology

## 2022-06-30 VITALS — BP 131/72 | HR 74 | Temp 97.8°F | Resp 16 | Ht 71.0 in | Wt 212.6 lb

## 2022-06-30 DIAGNOSIS — D472 Monoclonal gammopathy: Secondary | ICD-10-CM | POA: Diagnosis not present

## 2022-06-30 DIAGNOSIS — I129 Hypertensive chronic kidney disease with stage 1 through stage 4 chronic kidney disease, or unspecified chronic kidney disease: Secondary | ICD-10-CM | POA: Diagnosis not present

## 2022-06-30 DIAGNOSIS — N183 Chronic kidney disease, stage 3 unspecified: Secondary | ICD-10-CM | POA: Diagnosis not present

## 2022-06-30 DIAGNOSIS — Z87891 Personal history of nicotine dependence: Secondary | ICD-10-CM | POA: Diagnosis not present

## 2022-06-30 NOTE — Progress Notes (Signed)
Hematology/Oncology Consult note [facility]  Telephone:([phone] [phone]  Patient Care Team: Sofie Hartigan, MD as PCP - General (Family Medicine) End, [name], MD as PCP - Cardiology (Cardiology) Sindy Guadeloupe, MD as Consulting Physician (Oncology)   Name of the patient: Steve Warren  EI:[id number]  1945/10/22   Date of visit: 06/30/22  Diagnosis-IgG kappa MGUS  Chief complaint/ Reason for visit-routine follow-up of MGUS  Heme/Onc history: patient is 77 year old male who was recently seen by rheumatology for positive ANA.  He has a history of stage III chronic kidney disease, hypertension, hyperlipidemia and GERD.  He has been referred to [place] for abnormal SPEP.  His most recent labs from 11/07/2017 showed white count of 8.3, H&H of 12.8/38 with an MCV of 93.4 and a platelet count of 224.  LFTs were within normal limits.  Serum calcium was 9.3.  Serum creatinine was elevated at 1.58.  As a part of work-up for chronic kidney disease patient had an SPEP done which showed abnormal protein in the gammaglobulin and a possible second abnormal protein which may represent monoclonal immunoglobulins.  Recent CBC from 12/20/2017 showed H&H of 14.5/44.5.    Results of blood work from 01/07/2018 were as follows: CBC showed white count of 6.8, H&H of 14.5/42.4 and a platelet count of 196.  CMP was significant for elevated creatinine of 1.56.  Multiple myeloma panel showed 0.6 g of IgG kappa light chain monoclonal protein.  Free kappa light chain was elevated at 92.2 and kappa lambda light chain ratio was abnormal at 5.95.  Iron studies and folate were normal.  B12 levels were low at 224   Bone marrow biopsy showed 2% plasma cells with kappa light chain restriction     Interval history-he is doing overall well for his age.  He has not had any recent hospitalizations.  Appetite and weight have remained stable.  Denies any new aches and pains at this  time.  ECOG PS- 1 Pain scale- 0   Review of systems- Review of Systems  Constitutional:  Negative for chills, fever, malaise/fatigue and weight loss.  HENT:  Negative for congestion, ear discharge and nosebleeds.   Eyes:  Negative for blurred vision.  Respiratory:  Negative for cough, hemoptysis, sputum production, shortness of breath and wheezing.   Cardiovascular:  Negative for chest pain, palpitations, orthopnea and claudication.  Gastrointestinal:  Negative for abdominal pain, blood in stool, constipation, diarrhea, heartburn, melena, nausea and vomiting.  Genitourinary:  Negative for dysuria, flank pain, frequency, hematuria and urgency.  Musculoskeletal:  Negative for back pain, joint pain and myalgias.  Skin:  Negative for rash.  Neurological:  Negative for dizziness, tingling, focal weakness, seizures, weakness and headaches.  Endo/Heme/Allergies:  Does not bruise/bleed easily.  Psychiatric/Behavioral:  Negative for depression and suicidal ideas. The patient does not have insomnia.       No Known Allergies   Past Medical History:  Diagnosis Date   Anxiety    BPH (benign prostatic hyperplasia)    CAD in native artery    Cataract    bilateral- Dec 2021 and jan 2022   GERD (gastroesophageal reflux disease)    Hypercholesterolemia    Hyperlipidemia    Hypertension      Past Surgical History:  Procedure Laterality Date   APPENDECTOMY     CATARACT EXTRACTION, BILATERAL     COLONOSCOPY     CORONARY STENT INTERVENTION N/A 01/04/2021   Procedure: CORONARY STENT INTERVENTION;  Surgeon: End,  [name], MD;  Location: [facility];  Service: Cardiovascular;  Laterality: N/A;   RIGHT/LEFT HEART CATH AND CORONARY ANGIOGRAPHY Bilateral 01/04/2021   Procedure: RIGHT/LEFT HEART CATH AND CORONARY ANGIOGRAPHY;  Surgeon: Nelva Bush, MD;  Location: [facility];  Service: Cardiovascular;  Laterality: Bilateral;   TONSILLECTOMY      Social History    Socioeconomic History   Marital status: Married    Spouse name: Not on file   Number of children: Not on file   Years of education: Not on file   Highest education level: Not on file  Occupational History   Not on file  Tobacco Use   Smoking status: Former    Types: Cigarettes    Quit date: 2012    Years since quitting: 12.1   Smokeless tobacco: Never  Vaping Use   Vaping Use: Never used  Substance and Sexual Activity   Alcohol use: Yes    Comment: once a month liquor   Drug use: Never   Sexual activity: Not Currently  Other Topics Concern   Not on file  Social History Narrative   Not on file   Social Determinants of Health   Financial Resource Strain: Not on file  Food Insecurity: Not on file  Transportation Needs: Not on file  Physical Activity: Not on file  Stress: Not on file  Social Connections: Not on file  Intimate Partner Violence: Not on file    Family History  Problem Relation Age of Onset   Alzheimer's disease Father    Diabetes Maternal Grandmother      Current Outpatient Medications:    aspirin EC 81 MG EC tablet, Take 1 tablet (81 mg total) by mouth daily. Swallow whole., Disp: 30 tablet, Rfl: 0   atorvastatin (LIPITOR) 40 MG tablet, TAKE 1 TABLET(40 MG) BY MOUTH DAILY, Disp: 90 tablet, Rfl: 0   Bacillus Coagulans-Inulin (PROBIOTIC) 1-250 BILLION-MG CAPS, Take 1 capsule by mouth daily., Disp: , Rfl:    clopidogrel (PLAVIX) 75 MG tablet, TAKE 1 TABLET(75 MG) BY MOUTH DAILY, Disp: 30 tablet, Rfl: 0   Cyanocobalamin (VITAMIN B 12 PO), Take 1,000 mcg by mouth daily at 12 noon., Disp: , Rfl:    primidone (MYSOLINE) 50 MG tablet, Take 50 mg by mouth at bedtime., Disp: , Rfl:    Propylene Glycol (SYSTANE COMPLETE OP), Place 1 drop into both eyes in the morning, at noon, and at bedtime., Disp: , Rfl:    RESTASIS 0.05 % ophthalmic emulsion, 1 drop 2 (two) times daily., Disp: , Rfl:    tamsulosin (FLOMAX) 0.4 MG CAPS capsule, Take 0.4 mg by mouth daily  after supper., Disp: , Rfl:    erythromycin ophthalmic ointment, Place 1 application into both eyes at bedtime., Disp: , Rfl:   Physical exam:  Vitals:   06/30/22 1001  BP: 131/72  Pulse: 74  Resp: 16  Temp: 97.8 F (36.6 C)  TempSrc: Tympanic  SpO2: 100%  Weight: 212 lb 9.6 oz (96.4 kg)  Height: 5' 11"$  (1.803 m)   Physical Exam Cardiovascular:     Rate and Rhythm: Normal rate and regular rhythm.     Heart sounds: Normal heart sounds.  Pulmonary:     Effort: Pulmonary effort is normal.     Breath sounds: Normal breath sounds.  Skin:    General: Skin is warm and dry.  Neurological:     Mental Status: He is alert and oriented to person, place, and time.  Latest Ref Rng & Units 06/21/2022   10:24 AM  CMP  Glucose 70 - 99 mg/dL 93   BUN 8 - 23 mg/dL 22   Creatinine 0.61 - 1.24 mg/dL 1.22   Sodium 135 - 145 mmol/L 130   Potassium 3.5 - 5.1 mmol/L 4.0   Chloride 98 - 111 mmol/L 98   CO2 22 - 32 mmol/L 25   Calcium 8.9 - 10.3 mg/dL 8.7   Total Protein 6.5 - 8.1 g/dL 8.1   Total Bilirubin 0.3 - 1.2 mg/dL 0.8   Alkaline Phos 38 - 126 U/L 71   AST 15 - 41 U/L 23   ALT 0 - 44 U/L 22       Latest Ref Rng & Units 06/21/2022   10:24 AM  CBC  WBC 4.0 - 10.5 K/uL 6.9   Hemoglobin 13.0 - 17.0 g/dL 14.9   Hematocrit 39.0 - 52.0 % 46.7   Platelets 150 - 400 K/uL 186      Assessment and plan- Patient is a 77 y.o. male here for routine follow-up of IgG kappa MGUS    Bone survey did not show any active bone lesions.  Hemoglobin, calcium, renal functions are normal.  He does not meet any crab criteria.  Overall his free light chain ratio has remained stable between 10-11 for the last 2 years.  4 years ago the ratio was closer to 6 and then gradually drifted up to the 11 range but has remained stable presently in the last 2 years.  Myeloma panel shows M protein that has remained stable around 1.3-1.5 for the last 2 years.  Patient does not meet criteria for multiple myeloma  at this time and can be continued to be monitored on a yearly basis.   Visit Diagnosis 1. MGUS (monoclonal gammopathy of unknown significance)      Dr. Randa Evens, MD, MPH [facility] [id number] 06/30/2022 1:18 PM

## 2022-06-30 NOTE — Progress Notes (Signed)
No concerns for the provider.

## 2022-08-17 ENCOUNTER — Other Ambulatory Visit: Payer: Self-pay | Admitting: Physician Assistant

## 2022-08-17 DIAGNOSIS — E785 Hyperlipidemia, unspecified: Secondary | ICD-10-CM

## 2022-08-17 DIAGNOSIS — I519 Heart disease, unspecified: Secondary | ICD-10-CM

## 2022-08-17 DIAGNOSIS — R251 Tremor, unspecified: Secondary | ICD-10-CM

## 2022-08-17 DIAGNOSIS — I2510 Atherosclerotic heart disease of native coronary artery without angina pectoris: Secondary | ICD-10-CM

## 2022-08-17 DIAGNOSIS — R030 Elevated blood-pressure reading, without diagnosis of hypertension: Secondary | ICD-10-CM

## 2022-08-17 DIAGNOSIS — R0989 Other specified symptoms and signs involving the circulatory and respiratory systems: Secondary | ICD-10-CM

## 2022-08-17 DIAGNOSIS — I471 Supraventricular tachycardia, unspecified: Secondary | ICD-10-CM

## 2022-08-17 DIAGNOSIS — N1831 Chronic kidney disease, stage 3a: Secondary | ICD-10-CM

## 2022-08-21 ENCOUNTER — Ambulatory Visit: Payer: Medicare PPO | Admitting: Physician Assistant

## 2022-08-21 ENCOUNTER — Ambulatory Visit: Payer: Medicare PPO | Attending: Physician Assistant

## 2022-08-21 DIAGNOSIS — I519 Heart disease, unspecified: Secondary | ICD-10-CM

## 2022-08-21 DIAGNOSIS — R0989 Other specified symptoms and signs involving the circulatory and respiratory systems: Secondary | ICD-10-CM | POA: Diagnosis not present

## 2022-08-21 DIAGNOSIS — I2510 Atherosclerotic heart disease of native coronary artery without angina pectoris: Secondary | ICD-10-CM | POA: Diagnosis not present

## 2022-08-21 DIAGNOSIS — R030 Elevated blood-pressure reading, without diagnosis of hypertension: Secondary | ICD-10-CM

## 2022-08-21 NOTE — Progress Notes (Unsigned)
24 hour ambulatory blood pressure monitor applied to patients left arm using standard adult cuff.  Dr. Cristal Deer End to read.

## 2022-08-26 ENCOUNTER — Other Ambulatory Visit: Payer: Self-pay | Admitting: Physician Assistant

## 2022-09-04 DIAGNOSIS — R809 Proteinuria, unspecified: Secondary | ICD-10-CM | POA: Diagnosis not present

## 2022-09-04 DIAGNOSIS — E871 Hypo-osmolality and hyponatremia: Secondary | ICD-10-CM | POA: Diagnosis not present

## 2022-09-04 DIAGNOSIS — D472 Monoclonal gammopathy: Secondary | ICD-10-CM | POA: Diagnosis not present

## 2022-09-04 DIAGNOSIS — N182 Chronic kidney disease, stage 2 (mild): Secondary | ICD-10-CM | POA: Diagnosis not present

## 2022-09-05 DIAGNOSIS — N182 Chronic kidney disease, stage 2 (mild): Secondary | ICD-10-CM | POA: Diagnosis not present

## 2022-09-15 ENCOUNTER — Encounter: Payer: Self-pay | Admitting: Physician Assistant

## 2022-09-15 ENCOUNTER — Ambulatory Visit: Payer: Medicare PPO | Attending: Physician Assistant | Admitting: Physician Assistant

## 2022-09-15 VITALS — BP 135/76 | HR 62 | Ht 71.0 in | Wt 212.6 lb

## 2022-09-15 DIAGNOSIS — N1831 Chronic kidney disease, stage 3a: Secondary | ICD-10-CM

## 2022-09-15 DIAGNOSIS — I519 Heart disease, unspecified: Secondary | ICD-10-CM | POA: Diagnosis not present

## 2022-09-15 DIAGNOSIS — I25118 Atherosclerotic heart disease of native coronary artery with other forms of angina pectoris: Secondary | ICD-10-CM

## 2022-09-15 DIAGNOSIS — R0609 Other forms of dyspnea: Secondary | ICD-10-CM | POA: Diagnosis not present

## 2022-09-15 DIAGNOSIS — R5383 Other fatigue: Secondary | ICD-10-CM

## 2022-09-15 DIAGNOSIS — R0989 Other specified symptoms and signs involving the circulatory and respiratory systems: Secondary | ICD-10-CM

## 2022-09-15 DIAGNOSIS — E785 Hyperlipidemia, unspecified: Secondary | ICD-10-CM | POA: Diagnosis not present

## 2022-09-15 DIAGNOSIS — I2089 Other forms of angina pectoris: Secondary | ICD-10-CM

## 2022-09-15 NOTE — Progress Notes (Signed)
Cardiology Office Note    Date:  09/15/2022   ID:  Steve, Warren 04/18/1946, MRN [id number]  PCP:  Marina Goodell, MD  Cardiologist:  Yvonne Kendall, MD  Electrophysiologist:  None   Chief Complaint: Follow-up  History of Present Illness:   Steve Warren is a 77 y.o. male with history of CAD status post PCI to the RCA on 01/04/2021, aortic atherosclerosis, systolic dysfunction, pSVT, CKD stage IIIa, MGUS, essential tremor, chronic dizziness, HLD, BPH, and GERD who presents for follow up of follow-up of CAD.   He was admitted to [facility] in 11/2020 with a 4-day history of intermittent chest discomfort described as a sharp sensation in the center of his chest that radiated to his back and seemed to be associated primarily with activity, though also wondered if eating precipitated it.  There was some shortness of breath when the pain occurred.  High-sensitivity troponin peaked at 86.  BNP 112.  EKG demonstrated sinus rhythm with no acute ischemic ST-T changes. Chest x-ray showed low lung volumes without acute abnormality.  CTA chest/abdomen/pelvis showed no acute vascular abnormality with aortic atherosclerosis as well as at least two-vessel coronary artery calcifications, stable pulmonary nodules, colonic diverticulosis without acute diverticulitis, and severe degenerative changes within the lower lumbar spine noted.  Echo showed an EF of 45 to 50%, mild LVH, dyskinesis of the left ventricular, basal mid inferolateral wall, and inferior wall, grade 1 diastolic dysfunction, normal RV systolic function and ventricular cavity size, and trivial mitral regurgitation.  Lexiscan MPI showed no evidence of significant ischemia and was overall low risk.   He was seen in follow-up later in 11/2020 and was doing reasonably well from a cardiac perspective.  He did continue to note randomly occurring intermittent episodes of chest discomfort that were not as severe as what he experienced leading to his  hospital admission.  It was noted he was experiencing episodes of hypotension following transition from Toprol to carvedilol by outside office.  In this setting, he was transitioned back to Toprol.  Given his stable to improving symptoms, continued medical therapy was recommended.  However, we received a message later that month that he was experiencing worsening exertional dyspnea and angina that improved with rest.  In this setting, he underwent diagnostic R/LHC on 01/04/2021 which demonstrated chronic total/subtotal occlusion of the mid RCA as well as 30% proximal and mid LAD stenoses.  He underwent successful PCI to the mid RCA.  Normal LVEDP.   He was seen in hospital follow-up in 01/2021 and was doing well from a cardiac perspective.  Following his PCI, he was experiencing some dizziness.  Upon reviewing his medications, he was taking a full 25 mg of Toprol-XL and had associated hypotension.  Following decrease of Toprol to the previously recommended 12.5 mg his BP had improved with resolution of dizziness.  Following PCI, he noted resolution of exertional chest discomfort and dyspnea.  He was seen in the office on 04/13/2021 and continued to do well from a cardiac perspective.  It was noted he had been having some episodes of hypotension with home BP cuff readings.  He did note some intermittent positional dizziness.  He also noted several episodes of palpitations, though did not recall if these episodes occurred during episodes of dizziness.  Given episodes of hypotension, metoprolol was discontinued.  Subsequent Zio patch demonstrated a predominant rhythm of sinus with an average heart rate of 68 bpm (range 47 to 174 bpm), 17 episodes of SVT with  the fastest interval lasting 5 beats with a maximal rate of 174 bpm and the longest interval lasting 12 beats with an average rate of 107 bpm, occasional PACs, rare atrial couplets, atrial triplets, PVCs, ventricular couplets, and ventricular triplets.  He was seen  in the office in 05/2021 and was without further palpitations with noted improvement in his dizziness.  BP was stable in the low 100s to 1 teens systolic.  He was last seen in the office in 06/2022 and remained without symptoms of angina or cardiac decompensation.  Continued bradycardia, split-second lasting sharp right-sided chest discomfort that did not feel like his prior angina.  He continued to report fluctuations in BP ranging from 90s to 150s systolic.  In this setting, he had not needed any as needed metoprolol.  He continued to note some dizziness without frank syncope.  With increased stress, he noted worsening of his tremor.  24-hour ambulatory BP monitor showed an overall average blood pressure 116/66 (with a range of 80-146/43-80) with a mean heart rate of 74 bpm.  Average awake BP was 114/65 with a mean heart rate of 76 bpm.  Average asleep blood pressure was 121/68 with a mean heart rate of 66 bpm.  He comes in accompanied by his wife today and is doing well from a cardiac perspective.  No symptoms of angina or cardiac decompensation.  He does continue to note some exertional fatigue, though this is improving to approximately 1 day/month at this time.  Fatigue feels similar to what he was experiencing leading up to PCI in 2022.  Dizziness is stable.  Essential tremor has improved some.  Overall, blood pressure has been reasonably controlled at home with occasional readings around 158 systolic.  No presyncope or syncope.  No falls or symptoms concerning for bleeding.   Labs independently reviewed: 08/2022 - Hgb 14.9, PLT 185, BUN 24, serum creatinine 1.58, potassium 5.2, albumin 4.2 06/2022 - AST/ALT normal 05/2022 - TC 124, TG 78, HDL 39, LDL 69   Past Medical History:  Diagnosis Date   Anxiety    BPH (benign prostatic hyperplasia)    CAD in native artery    Cataract    bilateral- Dec 2021 and jan 2022   GERD (gastroesophageal reflux disease)    Hypercholesterolemia    Hyperlipidemia     Hypertension     Past Surgical History:  Procedure Laterality Date   APPENDECTOMY     CATARACT EXTRACTION, BILATERAL     COLONOSCOPY     CORONARY STENT INTERVENTION N/A 01/04/2021   Procedure: CORONARY STENT INTERVENTION;  Surgeon: Yvonne Kendall, MD;  Location: [facility] INVASIVE CV LAB;  Service: Cardiovascular;  Laterality: N/A;   RIGHT/LEFT HEART CATH AND CORONARY ANGIOGRAPHY Bilateral 01/04/2021   Procedure: RIGHT/LEFT HEART CATH AND CORONARY ANGIOGRAPHY;  Surgeon: Yvonne Kendall, MD;  Location: [facility] INVASIVE CV LAB;  Service: Cardiovascular;  Laterality: Bilateral;   TONSILLECTOMY      Current Medications: Current Meds  Medication Sig   aspirin EC 81 MG EC tablet Take 1 tablet (81 mg total) by mouth daily. Swallow whole.   atorvastatin (LIPITOR) 40 MG tablet TAKE 1 TABLET(40 MG) BY MOUTH DAILY   Bacillus Coagulans-Inulin (PROBIOTIC) 1-250 BILLION-MG CAPS Take 1 capsule by mouth daily.   clopidogrel (PLAVIX) 75 MG tablet TAKE 1 TABLET(75 MG) BY MOUTH DAILY   Cyanocobalamin (VITAMIN B 12 PO) Take 1,000 mcg by mouth daily at 12 noon.   Polyethyl Glycol-Propyl Glycol (SYSTANE) 0.4-0.3 % GEL ophthalmic gel Place 1 Application into  both eyes at bedtime.   primidone (MYSOLINE) 50 MG tablet Take 50 mg by mouth at bedtime.   Propylene Glycol (SYSTANE COMPLETE OP) Place 1 drop into both eyes in the morning, at noon, and at bedtime.   RESTASIS 0.05 % ophthalmic emulsion 1 drop 2 (two) times daily.   tamsulosin (FLOMAX) 0.4 MG CAPS capsule Take 0.4 mg by mouth daily after supper.    Allergies:   Patient has no known allergies.   Social History   Socioeconomic History   Marital status: Married    Spouse name: Not on file   Number of children: Not on file   Years of education: Not on file   Highest education level: Not on file  Occupational History   Not on file  Tobacco Use   Smoking status: Former    Types: Cigarettes    Quit date: 2012    Years since quitting: 12.3    Smokeless tobacco: Never  Vaping Use   Vaping Use: Never used  Substance and Sexual Activity   Alcohol use: Yes    Comment: once a month liquor   Drug use: Never   Sexual activity: Not Currently  Other Topics Concern   Not on file  Social History Narrative   Not on file   Social Determinants of Health   Financial Resource Strain: Not on file  Food Insecurity: Not on file  Transportation Needs: Not on file  Physical Activity: Not on file  Stress: Not on file  Social Connections: Not on file     Family History:  The patient's family history includes Alzheimer's disease in his father; Diabetes in his maternal grandmother.  ROS:   12-point review of systems is negative unless otherwise noted in the HPI.   EKGs/Labs/Other Studies Reviewed:    Studies reviewed were summarized above. The additional studies were reviewed today:  24-hour ambulatory BP monitor 08/2022:     Blood pressure was monitored for 24 hours.   Overall average blood pressure was 116/66 (range 80-146/43-80) with mean heart rate of 74 bpm.   Average awake blood pressure was 114/65 with mean heart rate of 76 bpm.   Average asleep blood pressure was 121/68 with mean heart rate 66 bpm.  Asleep BP dip was -6.4% systolic and -4.8% diastolic. __________  Luci Bank patch 03/2021: Patient had a min HR of 47 bpm, max HR of 174 bpm, and avg HR of 68 bpm. Predominant underlying rhythm was Sinus Rhythm. 17 Supraventricular Tachycardia runs occurred, the run with the fastest interval lasting 5 beats with a max rate of 174 bpm, the longest lasting 12 beats with an avg rate of 107 bpm. Isolated SVEs were occasional (1.2%, 15834), SVE Couplets were rare (<1.0%, 93), and SVE Triplets were rare (<1.0%, 22). Isolated VEs were rare (<1.0%, 10769), VE Couplets were rare (<1.0%, 11), and VE Triplets were rare (<1.0%, 1). Ventricular Bigeminy and Trigeminy were present.  __________   [facility] 12/2020: Conclusion Coronary artery disease  including chronic total/subtotal occlusion of the mid RCA, and 30% proximal and mid LAD. Normal left ventricular end-diastolic pressure of 15 mmHg RA 8, RV 25/11, PCP WP 11, PA 29/10 (19) CO 6.77, CI 3.15 Successful PCI to the mid RCA with placement of a 3.5 x 22 mm Onyx DES with excellent angiographic result and TIMI-3 flow   Recommendation Dual antiplatelet therapy with aspirin and Plavix for 6 months, ideally longer Ongoing aggressive secondary prevention Admit overnight for monitoring.  Discharge in the morning. __________  2D echo 11/16/2020: 1. Left ventricular ejection fraction, by estimation, is 45 to 50%. The  left ventricle has mildly decreased function. The left ventricle  demonstrates regional wall motion abnormalities (see scoring  diagram/findings for description). There is mild left  ventricular hypertrophy. Left ventricular diastolic parameters are  consistent with Grade I diastolic dysfunction (impaired relaxation). There  is dyskinesis of the left ventricular, basal-mid inferolateral wall and  inferior wall.   2. Right ventricular systolic function is normal. The right ventricular  size is normal.   3. The mitral valve was not well visualized. Trivial mitral valve  regurgitation.   4. The aortic valve was not well visualized. Aortic valve regurgitation  is not visualized. No aortic stenosis is present. __________   Eugenie Birks MPI 11/17/2020: The study is normal. This is a low risk study. The left ventricular ejection fraction is hyperdynamic (>65%). There is no evidence for ischemia    EKG:  EKG is ordered today.  The EKG ordered today demonstrates NSR, 62 bpm, no acute ST-T changes  Recent Labs: 06/21/2022: ALT 22; BUN 22; Creatinine, Ser 1.22; Hemoglobin 14.9; Platelets 186; Potassium 4.0; Sodium 130  Recent Lipid Panel    Component Value Date/Time   CHOL 116 01/27/2021 0927   TRIG 99 01/27/2021 0927   HDL 36 (L) 01/27/2021 0927   CHOLHDL 3.2 01/27/2021  0927   VLDL 20 01/27/2021 0927   LDLCALC 60 01/27/2021 0927    PHYSICAL EXAM:    VS:  BP 135/76 (BP Location: Left Arm, Patient Position: Sitting, Cuff Size: Normal)   Pulse 62   Ht 5\' 11"  (1.803 m)   Wt 212 lb 9.6 oz (96.4 kg)   SpO2 97%   BMI 29.65 kg/m   BMI: Body mass index is 29.65 kg/m.  Physical Exam Vitals reviewed.  Constitutional:      Appearance: He is well-developed.  HENT:     Head: Normocephalic and atraumatic.  Eyes:     General:        Right eye: No discharge.        Left eye: No discharge.  Neck:     Vascular: No JVD.  Cardiovascular:     Rate and Rhythm: Normal rate and regular rhythm.     Heart sounds: Normal heart sounds, S1 normal and S2 normal. Heart sounds not distant. No midsystolic click and no opening snap. No murmur heard.    No friction rub.  Pulmonary:     Effort: Pulmonary effort is normal. No respiratory distress.     Breath sounds: Normal breath sounds. No decreased breath sounds, wheezing or rales.  Chest:     Chest wall: No tenderness.  Abdominal:     General: There is no distension.  Musculoskeletal:     Cervical back: Normal range of motion.     Right lower leg: No edema.     Left lower leg: No edema.  Skin:    General: Skin is warm and dry.     Nails: There is no clubbing.  Neurological:     Mental Status: He is alert and oriented to person, place, and time.  Psychiatric:        Speech: Speech normal.        Behavior: Behavior normal.        Thought Content: Thought content normal.        Judgment: Judgment normal.     Wt Readings from Last 3 Encounters:  09/15/22 212 lb 9.6 oz (96.4 kg)  06/30/22  212 lb 9.6 oz (96.4 kg)  06/21/22 215 lb (97.5 kg)     ASSESSMENT & PLAN:   CAD involving the native coronary arteries without angina with exertional fatigue concerning for anginal equivalent: Currently without symptoms of angina or cardiac decompensation.  Schedule Lexiscan MPI to evaluate for high risk ischemia.  Continue  aggressive risk factor modification and secondary prevention including aspirin, clopidogrel, and atorvastatin.  Not on beta-blocker secondary to relative hypotension in the setting of labile hypertension.  Systolic dysfunction: Euvolemic and well compensated.  GDMT has been limited secondary to relative hypotension and underlying renal dysfunction.  Recommend avoiding standing loop diuretic at this time.  Labile hypertension: Blood pressure controlled in the office and was well-controlled on recent 24-hour ambulatory BP monitor.  Not currently requiring antihypertensive therapy.  CKD stage IIIa: Followed by nephrology.  HLD: LDL 69.  He remains on atorvastatin.   Shared Decision Making/Informed Consent{  The risks [chest pain, shortness of breath, cardiac arrhythmias, dizziness, blood pressure fluctuations, myocardial infarction, stroke/transient ischemic attack, nausea, vomiting, allergic reaction, radiation exposure, metallic taste sensation and life-threatening complications (estimated to be 1 in 10,000)], benefits (risk stratification, diagnosing coronary artery disease, treatment guidance) and alternatives of a nuclear stress test were discussed in detail with Mr. Stavros and he agrees to proceed.     Disposition: F/u with Dr. Okey Dupre or an APP in 6 months.   Medication Adjustments/Labs and Tests Ordered: Current medicines are reviewed at length with the patient today.  Concerns regarding medicines are outlined above. Medication changes, Labs and Tests ordered today are summarized above and listed in the Patient Instructions accessible in Encounters.   Signed, Eula Listen, PA-C 09/15/2022 12:54 PM     [facility] [place] [address] [place], [place] [address] [phone]

## 2022-09-15 NOTE — Patient Instructions (Signed)
Medication Instructions:  No changes at this time.   *If you need a refill on your cardiac medications before your next appointment, please call your pharmacy*   Lab Work: None  If you have labs (blood work) drawn today and your tests are completely normal, you will receive your results only by: MyChart Message (if you have MyChart) OR A paper copy in the mail If you have any lab test that is abnormal or we need to change your treatment, we will call you to review the results.   Testing/Procedures: [facility] MYOVIEW  Your Provider has ordered a Stress Test with nuclear imaging. The purpose of this test is to evaluate the blood supply to your heart muscle. This procedure is referred to as a "Non-Invasive Stress Test." This is because other than having an IV started in your vein, nothing is inserted or "invades" your body. Cardiac stress tests are done to find areas of poor blood flow to the heart by determining the extent of coronary artery disease (CAD). Some patients exercise on a treadmill, which naturally increases the blood flow to your heart, while others who are unable to walk on a treadmill due to physical limitations have a pharmacologic/chemical stress agent called Lexiscan. This medicine will mimic walking on a treadmill by temporarily increasing your coronary blood flow.     REPORT TO [facility] MEDICAL MALL ENTRANCE  **Proceed to the 1st desk on the right, REGISTRATION, to check in**  Please note: this test may take anywhere between 2-4 hours to complete    Instructions regarding medication:   _XX__:   You may take all of your regular morning medications the day of your test.     How to prepare for your Myoview test:  Do not eat or drink for 6 hours prior to the test No caffeine for 24 hours prior to the test No smoking 24 hours prior to the test. Ladies, please do not wear dresses.  Skirts or pants are appropriate. Please wear a short sleeve shirt. No perfume, cologne or  lotion. Wear comfortable walking shoes. No heels!   PLEASE NOTIFY THE OFFICE AT LEAST 24 HOURS IN ADVANCE IF YOU ARE UNABLE TO KEEP YOUR APPOINTMENT.  [phone] AND  PLEASE NOTIFY NUCLEAR MEDICINE AT [facility] AT LEAST 24 HOURS IN ADVANCE IF YOU ARE UNABLE TO KEEP YOUR APPOINTMENT. [phone]       Follow-Up: At [facility], you and your health needs are our priority.  As part of our continuing mission to provide you with exceptional heart care, we have created designated Provider Care Teams.  These Care Teams include your primary Cardiologist (physician) and Advanced Practice Providers (APPs -  Physician Assistants and Nurse Practitioners) who all work together to provide you with the care you need, when you need it.   Your next appointment:   6 month(s)  Provider:   Yvonne Kendall, MD or Eula Listen, PA-C

## 2022-09-25 ENCOUNTER — Ambulatory Visit
Admission: RE | Admit: 2022-09-25 | Discharge: 2022-09-25 | Disposition: A | Discharge status: Home / Self Care | Payer: Medicare PPO | Source: Ambulatory Visit | Attending: Physician Assistant | Admitting: Physician Assistant

## 2022-09-25 ENCOUNTER — Other Ambulatory Visit: Payer: Medicare PPO

## 2022-09-25 DIAGNOSIS — R0609 Other forms of dyspnea: Secondary | ICD-10-CM | POA: Insufficient documentation

## 2022-09-25 LAB — NM MYOCAR MULTI W/SPECT W/WALL MOTION / EF
LV dias vol: 66 mL (ref 62–150)
LV sys vol: 21 mL
Nuc Stress EF: 68 %
Peak HR: 90 {beats}/min
Percent HR: 63 %
Rest HR: 71 {beats}/min
Rest Nuclear Isotope Dose: 9.2 mCi
SDS: 0
SRS: 8
SSS: 2
ST Depression (mm): 0 mm
Stress Nuclear Isotope Dose: 31.5 mCi
TID: 0.82

## 2022-09-25 MED ORDER — TECHNETIUM TC 99M TETROFOSMIN IV KIT
10.0000 | PACK | Freq: Once | INTRAVENOUS | Status: AC | PRN
  Administered 2022-09-25: 9.21 via INTRAVENOUS

## 2022-09-25 MED ORDER — TECHNETIUM TC 99M TETROFOSMIN IV KIT
31.4800 | PACK | Freq: Once | INTRAVENOUS | Status: AC | PRN
  Administered 2022-09-25: 31.48 via INTRAVENOUS

## 2022-09-25 MED ORDER — REGADENOSON 0.4 MG/5ML IV SOLN
0.4000 mg | Freq: Once | INTRAVENOUS | Status: AC
  Administered 2022-09-25: 0.4 mg via INTRAVENOUS

## 2022-09-27 ENCOUNTER — Telehealth: Payer: Self-pay

## 2022-09-27 NOTE — Telephone Encounter (Signed)
Called pt to check on hematoma. Pt stated it has went down and looks good. Pt wanted to thank everyone for their excellent care.

## 2022-09-28 DIAGNOSIS — E871 Hypo-osmolality and hyponatremia: Secondary | ICD-10-CM | POA: Diagnosis not present

## 2022-09-28 DIAGNOSIS — N1831 Chronic kidney disease, stage 3a: Secondary | ICD-10-CM | POA: Diagnosis not present

## 2022-11-17 DIAGNOSIS — E785 Hyperlipidemia, unspecified: Secondary | ICD-10-CM | POA: Diagnosis not present

## 2022-11-24 DIAGNOSIS — N1831 Chronic kidney disease, stage 3a: Secondary | ICD-10-CM | POA: Diagnosis not present

## 2022-11-24 DIAGNOSIS — N401 Enlarged prostate with lower urinary tract symptoms: Secondary | ICD-10-CM | POA: Diagnosis not present

## 2022-11-24 DIAGNOSIS — K219 Gastro-esophageal reflux disease without esophagitis: Secondary | ICD-10-CM | POA: Diagnosis not present

## 2022-11-24 DIAGNOSIS — Z0000 Encounter for general adult medical examination without abnormal findings: Secondary | ICD-10-CM | POA: Diagnosis not present

## 2022-11-24 DIAGNOSIS — E871 Hypo-osmolality and hyponatremia: Secondary | ICD-10-CM | POA: Diagnosis not present

## 2022-11-24 DIAGNOSIS — D472 Monoclonal gammopathy: Secondary | ICD-10-CM | POA: Diagnosis not present

## 2022-11-24 DIAGNOSIS — E785 Hyperlipidemia, unspecified: Secondary | ICD-10-CM | POA: Diagnosis not present

## 2022-11-24 DIAGNOSIS — I2510 Atherosclerotic heart disease of native coronary artery without angina pectoris: Secondary | ICD-10-CM | POA: Diagnosis not present

## 2022-11-24 DIAGNOSIS — R251 Tremor, unspecified: Secondary | ICD-10-CM | POA: Diagnosis not present

## 2022-11-25 ENCOUNTER — Other Ambulatory Visit: Payer: Self-pay | Admitting: Internal Medicine

## 2022-12-22 ENCOUNTER — Other Ambulatory Visit: Payer: Self-pay | Admitting: Physician Assistant

## 2023-01-23 DIAGNOSIS — N1831 Chronic kidney disease, stage 3a: Secondary | ICD-10-CM | POA: Diagnosis not present

## 2023-01-23 DIAGNOSIS — H538 Other visual disturbances: Secondary | ICD-10-CM | POA: Diagnosis not present

## 2023-01-23 DIAGNOSIS — E785 Hyperlipidemia, unspecified: Secondary | ICD-10-CM | POA: Diagnosis not present

## 2023-01-23 DIAGNOSIS — I2510 Atherosclerotic heart disease of native coronary artery without angina pectoris: Secondary | ICD-10-CM | POA: Diagnosis not present

## 2023-01-23 DIAGNOSIS — I951 Orthostatic hypotension: Secondary | ICD-10-CM | POA: Diagnosis not present

## 2023-01-23 DIAGNOSIS — H18523 Epithelial (juvenile) corneal dystrophy, bilateral: Secondary | ICD-10-CM | POA: Diagnosis not present

## 2023-03-06 DIAGNOSIS — R809 Proteinuria, unspecified: Secondary | ICD-10-CM | POA: Diagnosis not present

## 2023-03-06 DIAGNOSIS — N182 Chronic kidney disease, stage 2 (mild): Secondary | ICD-10-CM | POA: Diagnosis not present

## 2023-03-12 DIAGNOSIS — N1831 Chronic kidney disease, stage 3a: Secondary | ICD-10-CM | POA: Diagnosis not present

## 2023-03-12 DIAGNOSIS — D472 Monoclonal gammopathy: Secondary | ICD-10-CM | POA: Diagnosis not present

## 2023-03-12 DIAGNOSIS — E871 Hypo-osmolality and hyponatremia: Secondary | ICD-10-CM | POA: Diagnosis not present

## 2023-03-13 NOTE — Progress Notes (Unsigned)
Cardiology Office Note    Date:  03/14/2023   ID:  Steve Warren, Steve Warren March 25, 1946, MRN [id number]  PCP:  Steve Goodell, MD  Cardiologist:  Steve Kendall, MD  Electrophysiologist:  None   Chief Complaint: Follow-up  History of Present Illness:   Steve Warren is a 77 y.o. male with history of CAD status post PCI to the RCA on 01/04/2021, aortic atherosclerosis, pSVT, CKD stage IIIa, MGUS, essential tremor, chronic dizziness, HLD, BPH, and GERD who presents for follow up of follow-up of CAD.   He was admitted to [facility] in 11/2020 with a 4-day history of intermittent chest discomfort described as a sharp sensation in the center of his chest that radiated to his back and seemed to be associated primarily with activity, though also wondered if eating precipitated it.  There was some shortness of breath when the pain occurred.  High-sensitivity troponin peaked at 86.  BNP 112.  EKG demonstrated sinus rhythm with no acute ischemic ST-T changes. Chest x-ray showed low lung volumes without acute abnormality.  CTA chest/abdomen/pelvis showed no acute vascular abnormality with aortic atherosclerosis as well as at least two-vessel coronary artery calcifications, stable pulmonary nodules, colonic diverticulosis without acute diverticulitis, and severe degenerative changes within the lower lumbar spine noted.  Echo showed an EF of 45 to 50%, mild LVH, dyskinesis of the left ventricular, basal mid inferolateral wall, and inferior wall, grade 1 diastolic dysfunction, normal RV systolic function and ventricular cavity size, and trivial mitral regurgitation.  Lexiscan MPI showed no evidence of significant ischemia and was overall low risk.   He was seen in follow-up later in 11/2020 and was doing reasonably well from a cardiac perspective.  He did continue to note randomly occurring intermittent episodes of chest discomfort that were not as severe as what he experienced leading to his hospital admission.  It  was noted he was experiencing episodes of hypotension following transition from Toprol to carvedilol by outside office.  In this setting, he was transitioned back to Toprol.  Given his stable to improving symptoms, continued medical therapy was recommended.  However, we received a message later that month that he was experiencing worsening exertional dyspnea and angina that improved with rest.  In this setting, he underwent diagnostic R/LHC on 01/04/2021 which demonstrated chronic total/subtotal occlusion of the mid RCA as well as 30% proximal and mid LAD stenoses.  He underwent successful PCI to the mid RCA.  Normal LVEDP.   He was seen in hospital follow-up in 01/2021 and was doing well from a cardiac perspective.  Following his PCI, he was experiencing some dizziness.  Upon reviewing his medications, he was taking a full 25 mg of Toprol-XL and had associated hypotension.  Following decrease of Toprol to the previously recommended 12.5 mg his BP had improved with resolution of dizziness.  Following PCI, he noted resolution of exertional chest discomfort and dyspnea.  He was seen in the office on 04/13/2021 and continued to do well from a cardiac perspective.  It was noted he had been having some episodes of hypotension with home BP cuff readings.  He did note some intermittent positional dizziness.  He also noted several episodes of palpitations, though did not recall if these episodes occurred during episodes of dizziness.  Given episodes of hypotension, metoprolol was discontinued.  Subsequent Zio patch demonstrated a predominant rhythm of sinus with an average heart rate of 68 bpm (range 47 to 174 bpm), 17 episodes of SVT with the fastest  interval lasting 5 beats with a maximal rate of 174 bpm and the longest interval lasting 12 beats with an average rate of 107 bpm, occasional PACs, rare atrial couplets, atrial triplets, PVCs, ventricular couplets, and ventricular triplets.  He was seen in the office in 05/2021  and was without further palpitations with noted improvement in his dizziness.  BP was stable in the low 100s to 1 teens systolic.  He was seen in the office in 06/2022 and continued to report bradycardic heart rates as well as split-second lasting sharp right-sided chest discomfort that did not feel like his prior angina.  He continued to report fluctuations in BP ranging from 90s to 150s systolic.  In this setting, he had not needed any as needed metoprolol.  He continued to note some dizziness without frank syncope.  With increased stress, he noted worsening of his tremor.  24-hour ambulatory BP monitor showed an overall average blood pressure 116/66 (with a range of 80-146/43-80) with a mean heart rate of 74 bpm.  Average awake BP was 114/65 with a mean heart rate of 76 bpm.  Average asleep blood pressure was 121/68 with a mean heart rate of 66 bpm.  He was last seen in the office in 09/2022 continuing to note some improving exertional fatigue that he felt was similar to what he experienced leading up to his PCI in 2022.  Dizziness was stable.  Subsequent Lexiscan MPI 09/2022 showed no evidence of ischemia or infarction LVEF of 68%.  CT attenuation corrected images showed mild coronary artery calcification and aortic atherosclerosis.  Overall, this was a low risk study.  He comes in accompanied by his wife needs to be with me cardiac perspective.  No symptoms of angina or cardiac decompensation.  Intermittent dizziness with fatigue is stable.  Blood pressures remain somewhat labile with most readings in the low 100s to 1 teens systolic with an occasional reading in the 80s to 90s systolic and into the 150s systolic.  No palpitations, presyncope, or syncope.  No falls or symptoms concerning for bleeding.  Has not needed any as needed metoprolol.  Weight remains stable.  Will be seeing neurology for essential tremor.   Labs independently reviewed: 02/2023 - BUN 18, serum creatinine 1.27, potassium 4.5, albumin  3.8 11/2022 - AST/ALT normal, TC 113, TG 89, HDL 39, LDL 56 08/2022 - Hgb 14.9, PLT 185  Past Medical History:  Diagnosis Date   Anxiety    BPH (benign prostatic hyperplasia)    CAD in native artery    Cataract    bilateral- Dec 2021 and jan 2022   GERD (gastroesophageal reflux disease)    Hypercholesterolemia    Hyperlipidemia    Hypertension     Past Surgical History:  Procedure Laterality Date   APPENDECTOMY     CATARACT EXTRACTION, BILATERAL     COLONOSCOPY     CORONARY STENT INTERVENTION N/A 01/04/2021   Procedure: CORONARY STENT INTERVENTION;  Surgeon: Steve Kendall, MD;  Location: [facility] INVASIVE CV LAB;  Service: Cardiovascular;  Laterality: N/A;   RIGHT/LEFT HEART CATH AND CORONARY ANGIOGRAPHY Bilateral 01/04/2021   Procedure: RIGHT/LEFT HEART CATH AND CORONARY ANGIOGRAPHY;  Surgeon: Steve Kendall, MD;  Location: [facility] INVASIVE CV LAB;  Service: Cardiovascular;  Laterality: Bilateral;   TONSILLECTOMY      Current Medications: Current Meds  Medication Sig   aspirin EC 81 MG EC tablet Take 1 tablet (81 mg total) by mouth daily. Swallow whole.   atorvastatin (LIPITOR) 40 MG tablet TAKE 1  TABLET(40 MG) BY MOUTH DAILY   Bacillus Coagulans-Inulin (PROBIOTIC) 1-250 BILLION-MG CAPS Take 1 capsule by mouth daily.   Cyanocobalamin (VITAMIN B 12 PO) Take 1,000 mcg by mouth daily at 12 noon.   Polyethyl Glycol-Propyl Glycol (SYSTANE) 0.4-0.3 % GEL ophthalmic gel Place 1 Application into both eyes at bedtime.   primidone (MYSOLINE) 50 MG tablet Take 50 mg by mouth in the morning and at bedtime.   Propylene Glycol (SYSTANE COMPLETE OP) Place 1 drop into both eyes in the morning, at noon, and at bedtime.   RESTASIS 0.05 % ophthalmic emulsion 1 drop 2 (two) times daily.   tamsulosin (FLOMAX) 0.4 MG CAPS capsule Take 0.4 mg by mouth daily after supper.   [DISCONTINUED] clopidogrel (PLAVIX) 75 MG tablet TAKE 1 TABLET(75 MG) BY MOUTH DAILY    Allergies:   Patient has no known  allergies.   Social History   Socioeconomic History   Marital status: Married    Spouse name: Not on file   Number of children: Not on file   Years of education: Not on file   Highest education level: Not on file  Occupational History   Not on file  Tobacco Use   Smoking status: Former    Current packs/day: 0.00    Types: Cigarettes    Quit date: 2012    Years since quitting: 12.8   Smokeless tobacco: Never  Vaping Use   Vaping status: Never Used  Substance and Sexual Activity   Alcohol use: Yes    Comment: once a month liquor   Drug use: Never   Sexual activity: Not Currently  Other Topics Concern   Not on file  Social History Narrative   Not on file   Social Determinants of Health   Financial Resource Strain: Low Risk  (11/24/2022)   Received from [facility]   Overall Financial Resource Strain (CARDIA)    Difficulty of Paying Living Expenses: Not very hard  Food Insecurity: No Food Insecurity (11/24/2022)   Received from [facility]   Hunger Vital Sign    Worried About Running Out of Food in the Last Year: Never true    Ran Out of Food in the Last Year: Never true  Transportation Needs: No Transportation Needs (11/24/2022)   Received from [facility] - Transportation    In the past 12 months, has lack of transportation kept you from medical appointments or from getting medications?: No    Lack of Transportation (Non-Medical): No  Physical Activity: Not on file  Stress: Not on file  Social Connections: Not on file     Family History:  The patient's family history includes Alzheimer's disease in his father; Diabetes in his maternal grandmother.  ROS:   12-point review of systems is negative unless otherwise noted in the HPI.   EKGs/Labs/Other Studies Reviewed:    Studies reviewed were summarized above. The additional studies were reviewed today:  Lexiscan MPI 09/25/2022:   The study is normal.  The study is low risk.   No ST deviation was noted.   LV perfusion is normal. There is no evidence of ischemia. There is no evidence of infarction.   Left ventricular function is normal. Nuclear stress EF: 68 %. End diastolic cavity size is normal. End systolic cavity size is normal.   CT attenuation images showed mild aortic and coronary calcifications. __________  24-hour ambulatory BP monitor 08/2022:     Blood pressure was monitored for  24 hours.   Overall average blood pressure was 116/66 (range 80-146/43-80) with mean heart rate of 74 bpm.   Average awake blood pressure was 114/65 with mean heart rate of 76 bpm.   Average asleep blood pressure was 121/68 with mean heart rate 66 bpm.  Asleep BP dip was -6.4% systolic and -4.8% diastolic. __________   Luci Bank patch 03/2021: Patient had a min HR of 47 bpm, max HR of 174 bpm, and avg HR of 68 bpm. Predominant underlying rhythm was Sinus Rhythm. 17 Supraventricular Tachycardia runs occurred, the run with the fastest interval lasting 5 beats with a max rate of 174 bpm, the longest lasting 12 beats with an avg rate of 107 bpm. Isolated SVEs were occasional (1.2%, 15834), SVE Couplets were rare (<1.0%, 93), and SVE Triplets were rare (<1.0%, 22). Isolated VEs were rare (<1.0%, 10769), VE Couplets were rare (<1.0%, 11), and VE Triplets were rare (<1.0%, 1). Ventricular Bigeminy and Trigeminy were present.  __________   [facility] 12/2020: Conclusion Coronary artery disease including chronic total/subtotal occlusion of the mid RCA, and 30% proximal and mid LAD. Normal left ventricular end-diastolic pressure of 15 mmHg RA 8, RV 25/11, PCP WP 11, PA 29/10 (19) CO 6.77, CI 3.15 Successful PCI to the mid RCA with placement of a 3.5 x 22 mm Onyx DES with excellent angiographic result and TIMI-3 flow   Recommendation Dual antiplatelet therapy with aspirin and Plavix for 6 months, ideally longer Ongoing aggressive secondary prevention Admit overnight for  monitoring.  Discharge in the morning. __________   2D echo 11/16/2020: 1. Left ventricular ejection fraction, by estimation, is 45 to 50%. The  left ventricle has mildly decreased function. The left ventricle  demonstrates regional wall motion abnormalities (see scoring  diagram/findings for description). There is mild left  ventricular hypertrophy. Left ventricular diastolic parameters are  consistent with Grade I diastolic dysfunction (impaired relaxation). There  is dyskinesis of the left ventricular, basal-mid inferolateral wall and  inferior wall.   2. Right ventricular systolic function is normal. The right ventricular  size is normal.   3. The mitral valve was not well visualized. Trivial mitral valve  regurgitation.   4. The aortic valve was not well visualized. Aortic valve regurgitation  is not visualized. No aortic stenosis is present. __________   Eugenie Birks MPI 11/17/2020: The study is normal. This is a low risk study. The left ventricular ejection fraction is hyperdynamic (>65%). There is no evidence for ischemia   EKG:  EKG is ordered today.  The EKG ordered today demonstrates NSR, 84 bpm, no acute ST-T changes  Recent Labs: 06/21/2022: ALT 22; BUN 22; Creatinine, Ser 1.22; Hemoglobin 14.9; Platelets 186; Potassium 4.0; Sodium 130  Recent Lipid Panel    Component Value Date/Time   CHOL 116 01/27/2021 0927   TRIG 99 01/27/2021 0927   HDL 36 (L) 01/27/2021 0927   CHOLHDL 3.2 01/27/2021 0927   VLDL 20 01/27/2021 0927   LDLCALC 60 01/27/2021 0927    PHYSICAL EXAM:    VS:  BP 120/72 (BP Location: Left Arm, Patient Position: Sitting, Cuff Size: Normal)   Pulse 84   Ht 5\' 11"  (1.803 m)   Wt 211 lb 12.8 oz (96.1 kg)   SpO2 96%   BMI 29.54 kg/m   BMI: Body mass index is 29.54 kg/m.  Physical Exam Vitals reviewed.  Constitutional:      Appearance: He is well-developed.  HENT:     Head: Normocephalic and atraumatic.  Eyes:  General:        Right eye: No  discharge.        Left eye: No discharge.  Neck:     Vascular: No JVD.  Cardiovascular:     Rate and Rhythm: Normal rate and regular rhythm.     Pulses:          Posterior tibial pulses are 2+ on the right side and 2+ on the left side.     Heart sounds: Normal heart sounds, S1 normal and S2 normal. Heart sounds not distant. No midsystolic click and no opening snap. No murmur heard.    No friction rub.  Pulmonary:     Effort: Pulmonary effort is normal. No respiratory distress.     Breath sounds: Normal breath sounds. No decreased breath sounds, wheezing or rales.  Chest:     Chest wall: No tenderness.  Abdominal:     General: There is no distension.  Musculoskeletal:     Cervical back: Normal range of motion.     Right lower leg: No edema.     Left lower leg: No edema.  Skin:    General: Skin is warm and dry.     Nails: There is no clubbing.  Neurological:     Mental Status: He is alert and oriented to person, place, and time.  Psychiatric:        Speech: Speech normal.        Behavior: Behavior normal.        Thought Content: Thought content normal.        Judgment: Judgment normal.     Wt Readings from Last 3 Encounters:  03/14/23 211 lb 12.8 oz (96.1 kg)  09/15/22 212 lb 9.6 oz (96.4 kg)  06/30/22 212 lb 9.6 oz (96.4 kg)     ASSESSMENT & PLAN:   CAD involving the native coronary arteries without angina: He is doing well and without symptoms concerning for angina or cardiac decompensation.  Recent Lexiscan MPI showed no evidence of significant ischemia with preserved LV systolic function.  Given it has been greater than 2 years since PCI to the RCA, and with otherwise minimal nonobstructive disease involving the LAD we have elected to discontinue clopidogrel.  He will remain on aspirin 81 mg daily indefinitely.  He will otherwise continue atorvastatin as outlined below.  Not on beta-blocker secondary to relative hypotension in the setting of labile hypertension.  No  indication for further ischemic testing at this time.  Labile hypertension: Blood pressure is well-controlled in the office this morning.  Not currently on antihypertensive therapy with well-controlled, 2 at times soft, blood pressure.  PSVT: Quiescent.  Has not needed any as needed metoprolol.  Not on scheduled beta-blocker secondary to labile hypertension.  CKD stage IIIa: Stable on most recent check.  Followed by nephrology.  HLD: LDL 56 in 11/2022 with normal AST/ALT at that time.  Remains on atorvastatin 40 mg.     Disposition: F/u with Dr. Okey Dupre or an APP in 6 months.   Medication Adjustments/Labs and Tests Ordered: Current medicines are reviewed at length with the patient today.  Concerns regarding medicines are outlined above. Medication changes, Labs and Tests ordered today are summarized above and listed in the Patient Instructions accessible in Encounters.   Signed, Eula Listen, PA-C 03/14/2023 11:19 AM     [facility] [address] [place], [place] [address] [phone]

## 2023-03-14 ENCOUNTER — Encounter: Payer: Self-pay | Admitting: Physician Assistant

## 2023-03-14 ENCOUNTER — Ambulatory Visit: Payer: Medicare PPO | Attending: Physician Assistant | Admitting: Physician Assistant

## 2023-03-14 VITALS — BP 120/72 | HR 84 | Ht 71.0 in | Wt 211.8 lb

## 2023-03-14 DIAGNOSIS — R0989 Other specified symptoms and signs involving the circulatory and respiratory systems: Secondary | ICD-10-CM

## 2023-03-14 DIAGNOSIS — I471 Supraventricular tachycardia, unspecified: Secondary | ICD-10-CM | POA: Diagnosis not present

## 2023-03-14 DIAGNOSIS — E785 Hyperlipidemia, unspecified: Secondary | ICD-10-CM

## 2023-03-14 DIAGNOSIS — N1831 Chronic kidney disease, stage 3a: Secondary | ICD-10-CM | POA: Diagnosis not present

## 2023-03-14 DIAGNOSIS — I2510 Atherosclerotic heart disease of native coronary artery without angina pectoris: Secondary | ICD-10-CM

## 2023-03-14 NOTE — Patient Instructions (Signed)
Medication Instructions:  Your physician recommends the following medication changes.  STOP TAKING: Plavix   Follow-Up: At [facility], you and your health needs are our priority.  As part of our continuing mission to provide you with exceptional heart care, we have created designated Provider Care Teams.  These Care Teams include your primary Cardiologist (physician) and Advanced Practice Providers (APPs -  Physician Assistants and Nurse Practitioners) who all work together to provide you with the care you need, when you need it.  We recommend signing up for the patient portal called "MyChart".  Sign up information is provided on this After Visit Summary.  MyChart is used to connect with patients for Virtual Visits (Telemedicine).  Patients are able to view lab/test results, encounter notes, upcoming appointments, etc.  Non-urgent messages can be sent to your provider as well.   To learn more about what you can do with MyChart, go to [email or web address].    Your next appointment:   6 month(s)  Provider:   You may see Yvonne Kendall, MD or one of the following Advanced Practice Providers on your designated Care Team:   Eula Listen, [place]

## 2023-03-21 ENCOUNTER — Other Ambulatory Visit: Payer: Self-pay | Admitting: Physician Assistant

## 2023-05-21 DIAGNOSIS — H539 Unspecified visual disturbance: Secondary | ICD-10-CM | POA: Diagnosis not present

## 2023-05-21 DIAGNOSIS — R413 Other amnesia: Secondary | ICD-10-CM | POA: Diagnosis not present

## 2023-05-21 DIAGNOSIS — Z79899 Other long term (current) drug therapy: Secondary | ICD-10-CM | POA: Diagnosis not present

## 2023-05-21 DIAGNOSIS — G20A1 Parkinson's disease without dyskinesia, without mention of fluctuations: Secondary | ICD-10-CM | POA: Diagnosis not present

## 2023-05-21 DIAGNOSIS — R2689 Other abnormalities of gait and mobility: Secondary | ICD-10-CM | POA: Diagnosis not present

## 2023-05-28 DIAGNOSIS — E785 Hyperlipidemia, unspecified: Secondary | ICD-10-CM | POA: Diagnosis not present

## 2023-06-01 DIAGNOSIS — R262 Difficulty in walking, not elsewhere classified: Secondary | ICD-10-CM | POA: Diagnosis not present

## 2023-06-01 DIAGNOSIS — M6281 Muscle weakness (generalized): Secondary | ICD-10-CM | POA: Diagnosis not present

## 2023-06-04 DIAGNOSIS — N1831 Chronic kidney disease, stage 3a: Secondary | ICD-10-CM | POA: Diagnosis not present

## 2023-06-04 DIAGNOSIS — E7800 Pure hypercholesterolemia, unspecified: Secondary | ICD-10-CM | POA: Diagnosis not present

## 2023-06-04 DIAGNOSIS — K219 Gastro-esophageal reflux disease without esophagitis: Secondary | ICD-10-CM | POA: Diagnosis not present

## 2023-06-04 DIAGNOSIS — I2510 Atherosclerotic heart disease of native coronary artery without angina pectoris: Secondary | ICD-10-CM | POA: Diagnosis not present

## 2023-06-04 DIAGNOSIS — D472 Monoclonal gammopathy: Secondary | ICD-10-CM | POA: Diagnosis not present

## 2023-06-04 DIAGNOSIS — R251 Tremor, unspecified: Secondary | ICD-10-CM | POA: Diagnosis not present

## 2023-06-04 DIAGNOSIS — E871 Hypo-osmolality and hyponatremia: Secondary | ICD-10-CM | POA: Diagnosis not present

## 2023-06-04 DIAGNOSIS — N401 Enlarged prostate with lower urinary tract symptoms: Secondary | ICD-10-CM | POA: Diagnosis not present

## 2023-06-05 DIAGNOSIS — R262 Difficulty in walking, not elsewhere classified: Secondary | ICD-10-CM | POA: Diagnosis not present

## 2023-06-05 DIAGNOSIS — H903 Sensorineural hearing loss, bilateral: Secondary | ICD-10-CM | POA: Diagnosis not present

## 2023-06-05 DIAGNOSIS — M6281 Muscle weakness (generalized): Secondary | ICD-10-CM | POA: Diagnosis not present

## 2023-06-05 DIAGNOSIS — H6983 Other specified disorders of Eustachian tube, bilateral: Secondary | ICD-10-CM | POA: Diagnosis not present

## 2023-06-05 DIAGNOSIS — H6123 Impacted cerumen, bilateral: Secondary | ICD-10-CM | POA: Diagnosis not present

## 2023-06-07 DIAGNOSIS — S0501XA Injury of conjunctiva and corneal abrasion without foreign body, right eye, initial encounter: Secondary | ICD-10-CM | POA: Diagnosis not present

## 2023-06-08 DIAGNOSIS — R262 Difficulty in walking, not elsewhere classified: Secondary | ICD-10-CM | POA: Diagnosis not present

## 2023-06-08 DIAGNOSIS — M6281 Muscle weakness (generalized): Secondary | ICD-10-CM | POA: Diagnosis not present

## 2023-06-12 DIAGNOSIS — S0501XD Injury of conjunctiva and corneal abrasion without foreign body, right eye, subsequent encounter: Secondary | ICD-10-CM | POA: Diagnosis not present

## 2023-06-13 DIAGNOSIS — R262 Difficulty in walking, not elsewhere classified: Secondary | ICD-10-CM | POA: Diagnosis not present

## 2023-06-13 DIAGNOSIS — M6281 Muscle weakness (generalized): Secondary | ICD-10-CM | POA: Diagnosis not present

## 2023-06-15 DIAGNOSIS — R262 Difficulty in walking, not elsewhere classified: Secondary | ICD-10-CM | POA: Diagnosis not present

## 2023-06-15 DIAGNOSIS — M6281 Muscle weakness (generalized): Secondary | ICD-10-CM | POA: Diagnosis not present

## 2023-06-20 DIAGNOSIS — R262 Difficulty in walking, not elsewhere classified: Secondary | ICD-10-CM | POA: Diagnosis not present

## 2023-06-20 DIAGNOSIS — M6281 Muscle weakness (generalized): Secondary | ICD-10-CM | POA: Diagnosis not present

## 2023-06-22 DIAGNOSIS — M6281 Muscle weakness (generalized): Secondary | ICD-10-CM | POA: Diagnosis not present

## 2023-06-22 DIAGNOSIS — R262 Difficulty in walking, not elsewhere classified: Secondary | ICD-10-CM | POA: Diagnosis not present

## 2023-06-27 DIAGNOSIS — M6281 Muscle weakness (generalized): Secondary | ICD-10-CM | POA: Diagnosis not present

## 2023-06-27 DIAGNOSIS — R262 Difficulty in walking, not elsewhere classified: Secondary | ICD-10-CM | POA: Diagnosis not present

## 2023-06-29 DIAGNOSIS — M6281 Muscle weakness (generalized): Secondary | ICD-10-CM | POA: Diagnosis not present

## 2023-06-29 DIAGNOSIS — R262 Difficulty in walking, not elsewhere classified: Secondary | ICD-10-CM | POA: Diagnosis not present

## 2023-07-02 ENCOUNTER — Inpatient Hospital Stay: Payer: Medicare PPO | Attending: Oncology

## 2023-07-02 ENCOUNTER — Other Ambulatory Visit: Payer: Self-pay

## 2023-07-02 DIAGNOSIS — N189 Chronic kidney disease, unspecified: Secondary | ICD-10-CM | POA: Diagnosis not present

## 2023-07-02 DIAGNOSIS — D472 Monoclonal gammopathy: Secondary | ICD-10-CM

## 2023-07-02 DIAGNOSIS — Z87891 Personal history of nicotine dependence: Secondary | ICD-10-CM | POA: Diagnosis not present

## 2023-07-02 LAB — CBC WITH DIFFERENTIAL (CANCER CENTER ONLY)
Abs Immature Granulocytes: 0.03 10*3/uL (ref 0.00–0.07)
Basophils Absolute: 0 10*3/uL (ref 0.0–0.1)
Basophils Relative: 1 %
Eosinophils Absolute: 0.2 10*3/uL (ref 0.0–0.5)
Eosinophils Relative: 4 %
HCT: 45.3 % (ref 39.0–52.0)
Hemoglobin: 14.6 g/dL (ref 13.0–17.0)
Immature Granulocytes: 1 %
Lymphocytes Relative: 17 %
Lymphs Abs: 1 10*3/uL (ref 0.7–4.0)
MCH: 30.8 pg (ref 26.0–34.0)
MCHC: 32.2 g/dL (ref 30.0–36.0)
MCV: 95.6 fL (ref 80.0–100.0)
Monocytes Absolute: 0.6 10*3/uL (ref 0.1–1.0)
Monocytes Relative: 11 %
Neutro Abs: 3.8 10*3/uL (ref 1.7–7.7)
Neutrophils Relative %: 66 %
Platelet Count: 176 10*3/uL (ref 150–400)
RBC: 4.74 MIL/uL (ref 4.22–5.81)
RDW: 13.2 % (ref 11.5–15.5)
WBC Count: 5.7 10*3/uL (ref 4.0–10.5)
nRBC: 0 % (ref 0.0–0.2)

## 2023-07-02 LAB — CMP (CANCER CENTER ONLY)
ALT: 17 U/L (ref 0–44)
AST: 18 U/L (ref 15–41)
Albumin: 4 g/dL (ref 3.5–5.0)
Alkaline Phosphatase: 54 U/L (ref 38–126)
Anion gap: 8 (ref 5–15)
BUN: 18 mg/dL (ref 8–23)
CO2: 27 mmol/L (ref 22–32)
Calcium: 8.5 mg/dL — ABNORMAL LOW (ref 8.9–10.3)
Chloride: 97 mmol/L — ABNORMAL LOW (ref 98–111)
Creatinine: 1.31 mg/dL — ABNORMAL HIGH (ref 0.61–1.24)
GFR, Estimated: 55 mL/min — ABNORMAL LOW (ref >60)
Glucose, Bld: 128 mg/dL — ABNORMAL HIGH (ref 70–99)
Potassium: 3.9 mmol/L (ref 3.5–5.1)
Sodium: 132 mmol/L — ABNORMAL LOW (ref 135–145)
Total Bilirubin: 1 mg/dL (ref 0.0–1.2)
Total Protein: 7.7 g/dL (ref 6.5–8.1)

## 2023-07-03 LAB — KAPPA/LAMBDA LIGHT CHAINS
Kappa free light chain: 120.9 mg/L — ABNORMAL HIGH (ref 3.3–19.4)
Kappa, lambda light chain ratio: 11.74 — ABNORMAL HIGH (ref 0.26–1.65)
Lambda free light chains: 10.3 mg/L (ref 5.7–26.3)

## 2023-07-04 DIAGNOSIS — M6281 Muscle weakness (generalized): Secondary | ICD-10-CM | POA: Diagnosis not present

## 2023-07-04 DIAGNOSIS — R262 Difficulty in walking, not elsewhere classified: Secondary | ICD-10-CM | POA: Diagnosis not present

## 2023-07-04 LAB — MULTIPLE MYELOMA PANEL, SERUM
Albumin SerPl Elph-Mcnc: 3.8 g/dL (ref 2.9–4.4)
Albumin/Glob SerPl: 1.1 (ref 0.7–1.7)
Alpha 1: 0.2 g/dL (ref 0.0–0.4)
Alpha2 Glob SerPl Elph-Mcnc: 0.6 g/dL (ref 0.4–1.0)
B-Globulin SerPl Elph-Mcnc: 0.7 g/dL (ref 0.7–1.3)
Gamma Glob SerPl Elph-Mcnc: 1.9 g/dL — ABNORMAL HIGH (ref 0.4–1.8)
Globulin, Total: 3.5 g/dL (ref 2.2–3.9)
IgA: 59 mg/dL — ABNORMAL LOW (ref 61–437)
IgG (Immunoglobin G), Serum: 2048 mg/dL — ABNORMAL HIGH (ref 603–1613)
IgM (Immunoglobulin M), Srm: 17 mg/dL (ref 15–143)
M Protein SerPl Elph-Mcnc: 1.5 g/dL — ABNORMAL HIGH
Total Protein ELP: 7.3 g/dL (ref 6.0–8.5)

## 2023-07-05 ENCOUNTER — Other Ambulatory Visit: Payer: Self-pay

## 2023-07-05 DIAGNOSIS — D472 Monoclonal gammopathy: Secondary | ICD-10-CM

## 2023-07-09 ENCOUNTER — Encounter: Payer: Self-pay | Admitting: Oncology

## 2023-07-09 ENCOUNTER — Inpatient Hospital Stay: Payer: Medicare PPO | Admitting: Oncology

## 2023-07-09 VITALS — BP 139/79 | HR 72 | Temp 98.7°F | Resp 20 | Wt 220.7 lb

## 2023-07-09 DIAGNOSIS — N189 Chronic kidney disease, unspecified: Secondary | ICD-10-CM | POA: Diagnosis not present

## 2023-07-09 DIAGNOSIS — D472 Monoclonal gammopathy: Secondary | ICD-10-CM

## 2023-07-09 DIAGNOSIS — Z87891 Personal history of nicotine dependence: Secondary | ICD-10-CM | POA: Diagnosis not present

## 2023-07-09 NOTE — Progress Notes (Signed)
 Hematology/Oncology Consult note [facility]  [phone] [phone]  Patient Care Team: Marina Goodell, MD as PCP - General (Family Medicine) End, Cristal Deer, MD as PCP - Cardiology (Cardiology) Creig Hines, MD as Consulting Physician (Oncology)   Name of the patient: Steve Warren  [id number]  1945-12-19   Date of visit: 07/09/23  Diagnosis-IgG kappa MGUS  Chief complaint/ Reason for visit-routine follow-up of IgG kappa MGUS  Heme/Onc history: patient is 78 year old male who was recently seen by rheumatology for positive ANA.  He has a history of stage III chronic kidney disease, hypertension, hyperlipidemia and GERD.  He has been referred to [place] for abnormal SPEP.  His most recent labs from 11/07/2017 showed white count of 8.3, H&H of 12.8/38 with an MCV of 93.4 and a platelet count of 224.  LFTs were within normal limits.  Serum calcium was 9.3.  Serum creatinine was elevated at 1.58.  As a part of work-up for chronic kidney disease patient had an SPEP done which showed abnormal protein in the gammaglobulin and a possible second abnormal protein which may represent monoclonal immunoglobulins.  Recent CBC from 12/20/2017 showed H&H of 14.5/44.5.    Results of blood work from 01/07/2018 were as follows: CBC showed white count of 6.8, H&H of 14.5/42.4 and a platelet count of 196.  CMP was significant for elevated creatinine of 1.56.  Multiple myeloma panel showed 0.6 g of IgG kappa light chain monoclonal protein.  Free kappa light chain was elevated at 92.2 and kappa lambda light chain ratio was abnormal at 5.95.  Iron studies and folate were normal.  B12 levels were low at 224   Bone marrow biopsy showed 2% plasma cells with kappa light chain restriction    Interval history-patient is undergoing more tests in the near future to confirm if he has Parkinson disease.  Health wise he is doing well and he has not had any recent  hospitalizations.  No changes in his appetite or weight  ECOG PS- 2 Pain scale- 0   Review of systems- Review of Systems  Constitutional:  Negative for chills, fever, malaise/fatigue and weight loss.  HENT:  Negative for congestion, ear discharge and nosebleeds.   Eyes:  Negative for blurred vision.  Respiratory:  Negative for cough, hemoptysis, sputum production, shortness of breath and wheezing.   Cardiovascular:  Negative for chest pain, palpitations, orthopnea and claudication.  Gastrointestinal:  Negative for abdominal pain, blood in stool, constipation, diarrhea, heartburn, melena, nausea and vomiting.  Genitourinary:  Negative for dysuria, flank pain, frequency, hematuria and urgency.  Musculoskeletal:  Negative for back pain, joint pain and myalgias.  Skin:  Negative for rash.  Neurological:  Negative for dizziness, tingling, focal weakness, seizures, weakness and headaches.  Endo/Heme/Allergies:  Does not bruise/bleed easily.  Psychiatric/Behavioral:  Negative for depression and suicidal ideas. The patient does not have insomnia.       No Known Allergies   Past Medical History:  Diagnosis Date   Anxiety    BPH (benign prostatic hyperplasia)    CAD in native artery    Cataract    bilateral- Dec 2021 and jan 2022   GERD (gastroesophageal reflux disease)    Hypercholesterolemia    Hyperlipidemia    Hypertension      Past Surgical History:  Procedure Laterality Date   APPENDECTOMY     CATARACT EXTRACTION, BILATERAL     COLONOSCOPY     CORONARY STENT INTERVENTION N/A 01/04/2021  Procedure: CORONARY STENT INTERVENTION;  Surgeon: Yvonne Kendall, MD;  Location: [facility] INVASIVE CV [facility];  Service: Cardiovascular;  Laterality: N/A;   RIGHT/LEFT HEART CATH AND CORONARY ANGIOGRAPHY Bilateral 01/04/2021   Procedure: RIGHT/LEFT HEART CATH AND CORONARY ANGIOGRAPHY;  Surgeon: Yvonne Kendall, MD;  Location: [facility] INVASIVE CV LAB;  Service: Cardiovascular;  Laterality: Bilateral;    TONSILLECTOMY      Social History   Socioeconomic History   Marital status: Married    Spouse name: Not on file   Number of children: Not on file   Years of education: Not on file   Highest education level: Not on file  Occupational History   Not on file  Tobacco Use   Smoking status: Former    Current packs/day: 0.00    Types: Cigarettes    Quit date: 2012    Years since quitting: 13.1   Smokeless tobacco: Never  Vaping Use   Vaping status: Never Used  Substance and Sexual Activity   Alcohol use: Yes    Comment: once a month liquor   Drug use: Never   Sexual activity: Not Currently  Other Topics Concern   Not on file  Social History Narrative   Not on file   Social Drivers of Health   Financial Resource Strain: Low Risk  (11/24/2022)   Received from [facility]   Overall Financial Resource Strain (CARDIA)    Difficulty of Paying Living Expenses: Not very hard  Food Insecurity: No Food Insecurity (11/24/2022)   Received from [facility]   Hunger Vital Sign    Worried About Running Out of Food in the Last Year: Never true    Ran Out of Food in the Last Year: Never true  Transportation Needs: No Transportation Needs (11/24/2022)   Received from [facility] - Transportation    In the past 12 months, has lack of transportation kept you from medical appointments or from getting medications?: No    Lack of Transportation (Non-Medical): No  Physical Activity: Not on file  Stress: Not on file  Social Connections: Not on file  Intimate Partner Violence: Not on file    Family History  Problem Relation Age of Onset   Alzheimer's disease Father    Diabetes Maternal Grandmother      Current Outpatient Medications:    aspirin EC 81 MG EC tablet, Take 1 tablet (81 mg total) by mouth daily. Swallow whole., Disp: 30 tablet, Rfl: 0   atorvastatin (LIPITOR) 40 MG tablet, TAKE 1 TABLET(40 MG) BY MOUTH DAILY,  Disp: 90 tablet, Rfl: 1   Bacillus Coagulans-Inulin (PROBIOTIC) 1-250 BILLION-MG CAPS, Take 1 capsule by mouth daily., Disp: , Rfl:    calcium carbonate (TUMS EX) 750 MG chewable tablet, Chew by mouth., Disp: , Rfl:    Cholecalciferol (VITAMIN D-1000 MAX ST) 25 MCG (1000 UT) tablet, Take 1 tablet by mouth daily., Disp: , Rfl:    Cyanocobalamin (VITAMIN B 12 PO), Take 1,000 mcg by mouth daily at 12 noon., Disp: , Rfl:    Polyethyl Glycol-Propyl Glycol (SYSTANE) 0.4-0.3 % GEL ophthalmic gel, Place 1 Application into both eyes at bedtime., Disp: , Rfl:    primidone (MYSOLINE) 50 MG tablet, Take 50 mg by mouth in the morning and at bedtime., Disp: , Rfl:    Propylene Glycol (SYSTANE COMPLETE OP), Place 1 drop into both eyes in the morning, at noon, and at bedtime., Disp: , Rfl:    RESTASIS 0.05 %  ophthalmic emulsion, 1 drop 2 (two) times daily., Disp: , Rfl:    tamsulosin (FLOMAX) 0.4 MG CAPS capsule, Take 0.4 mg by mouth daily after supper., Disp: , Rfl:   Physical exam:  Vitals:   07/09/23 0925  BP: 139/79  Pulse: 72  Resp: 20  Temp: 98.7 F (37.1 C)  SpO2: 100%  Weight: 220 lb 11.2 oz (100.1 kg)   Physical Exam Cardiovascular:     Rate and Rhythm: Normal rate and regular rhythm.     Heart sounds: Normal heart sounds.  Pulmonary:     Effort: Pulmonary effort is normal.     Breath sounds: Normal breath sounds.  Skin:    General: Skin is warm and dry.  Neurological:     Mental Status: He is alert and oriented to person, place, and time.         Latest Ref Rng & Units 07/02/2023    9:24 AM  CMP  Glucose 70 - 99 mg/dL 045   BUN 8 - 23 mg/dL 18   Creatinine 4.09 - 1.24 mg/dL 8.11   Sodium 914 - 782 mmol/L 132   Potassium 3.5 - 5.1 mmol/L 3.9   Chloride 98 - 111 mmol/L 97   CO2 22 - 32 mmol/L 27   Calcium 8.9 - 10.3 mg/dL 8.5   Total Protein 6.5 - 8.1 g/dL 7.7   Total Bilirubin 0.0 - 1.2 mg/dL 1.0   Alkaline Phos 38 - 126 U/L 54   AST 15 - 41 U/L 18   ALT 0 - 44 U/L 17        Latest Ref Rng & Units 07/02/2023    9:24 AM  CBC  WBC 4.0 - 10.5 K/uL 5.7   Hemoglobin 13.0 - 17.0 g/dL 95.6   Hematocrit 21.3 - 52.0 % 45.3   Platelets 150 - 400 K/uL 176      Assessment and plan- Patient is a 78 y.o. male here for routine follow-up of IgG kappa MGUS  No evidence of anemia.  CKD is overall stable.  No hypercalcemia.  No new bone pain.  Myeloma panel shows stable M protein which fluctuates between 1.3 to 1.5 g.  Serum free light chain ratio has remained stable around 11.  I will see him back in 1 year with CBC with differential CMP myeloma panel and serum free light chains   Visit Diagnosis 1. MGUS (monoclonal gammopathy of unknown significance)      Dr. Owens Shark, MD, MPH [facility] at [facility] [phone] 07/09/2023 1:37 PM

## 2023-07-11 DIAGNOSIS — M6281 Muscle weakness (generalized): Secondary | ICD-10-CM | POA: Diagnosis not present

## 2023-07-11 DIAGNOSIS — R262 Difficulty in walking, not elsewhere classified: Secondary | ICD-10-CM | POA: Diagnosis not present

## 2023-07-18 DIAGNOSIS — M6281 Muscle weakness (generalized): Secondary | ICD-10-CM | POA: Diagnosis not present

## 2023-07-18 DIAGNOSIS — R262 Difficulty in walking, not elsewhere classified: Secondary | ICD-10-CM | POA: Diagnosis not present

## 2023-07-25 DIAGNOSIS — R262 Difficulty in walking, not elsewhere classified: Secondary | ICD-10-CM | POA: Diagnosis not present

## 2023-07-25 DIAGNOSIS — M6281 Muscle weakness (generalized): Secondary | ICD-10-CM | POA: Diagnosis not present

## 2023-08-02 DIAGNOSIS — H16223 Keratoconjunctivitis sicca, not specified as Sjogren's, bilateral: Secondary | ICD-10-CM | POA: Diagnosis not present

## 2023-08-02 DIAGNOSIS — H43813 Vitreous degeneration, bilateral: Secondary | ICD-10-CM | POA: Diagnosis not present

## 2023-08-09 DIAGNOSIS — G20A1 Parkinson's disease without dyskinesia, without mention of fluctuations: Secondary | ICD-10-CM | POA: Diagnosis not present

## 2023-09-05 DIAGNOSIS — G20A1 Parkinson's disease without dyskinesia, without mention of fluctuations: Secondary | ICD-10-CM | POA: Diagnosis not present

## 2023-09-07 ENCOUNTER — Other Ambulatory Visit: Payer: Self-pay | Admitting: Physician Assistant

## 2023-09-10 DIAGNOSIS — E871 Hypo-osmolality and hyponatremia: Secondary | ICD-10-CM | POA: Diagnosis not present

## 2023-09-10 DIAGNOSIS — D472 Monoclonal gammopathy: Secondary | ICD-10-CM | POA: Diagnosis not present

## 2023-09-10 DIAGNOSIS — N1831 Chronic kidney disease, stage 3a: Secondary | ICD-10-CM | POA: Diagnosis not present

## 2023-09-10 NOTE — Progress Notes (Unsigned)
 Cardiology Office Note    Date:  09/12/2023   ID:  Teller, Balgobin [birth year], MRN [id number]  PCP:  Lorrie Rothman, MD  Cardiologist:  Sammy Crisp, MD  Electrophysiologist:  None   Chief Complaint: Follow-up  History of Present Illness:   Steve Warren is a 78 y.o. male with history of CAD status post PCI to the RCA on 01/04/2021, aortic atherosclerosis, pSVT, CKD stage IIIa, MGUS, Parkinson's disease, chronic dizziness, HLD, BPH, and GERD who presents for follow up of follow-up of CAD.   He was admitted to [facility] in 11/2020 with a 4-day history of intermittent chest discomfort described as a sharp sensation in the center of his chest that radiated to his back and seemed to be associated primarily with activity, though also wondered if eating precipitated it.  There was some shortness of breath when the pain occurred.  High-sensitivity troponin peaked at 86.  BNP 112.  EKG demonstrated sinus rhythm with no acute ischemic ST-T changes. Chest x-ray showed low lung volumes without acute abnormality.  CTA chest/abdomen/pelvis showed no acute vascular abnormality with aortic atherosclerosis as well as at least two-vessel coronary artery calcifications, stable pulmonary nodules, colonic diverticulosis without acute diverticulitis, and severe degenerative changes within the lower lumbar spine noted.  Echo showed an EF of 45 to 50%, mild LVH, dyskinesis of the left ventricular, basal mid inferolateral wall, and inferior wall, grade 1 diastolic dysfunction, normal RV systolic function and ventricular cavity size, and trivial mitral regurgitation.  Lexiscan  MPI showed no evidence of significant ischemia and was overall low risk.   He was seen in follow-up later in 11/2020 and did continue to note randomly occurring intermittent episodes of chest discomfort that were not as severe as what he experienced leading to his hospital admission.  It was noted he was experiencing episodes of hypotension  following transition from Toprol  to carvedilol  by outside office.  In this setting, he was transitioned back to Toprol .  Given his stable to improving symptoms, continued medical therapy was recommended.  However, we received a message later that month that he was experiencing worsening exertional dyspnea and angina that improved with rest.  In this setting, he underwent diagnostic R/LHC on 01/04/2021 which demonstrated chronic total/subtotal occlusion of the mid RCA as well as 30% proximal and mid LAD stenoses.  He underwent successful PCI to the mid RCA.  Normal LVEDP.   He was seen in hospital follow-up in 01/2021 and was experiencing some dizziness.  Upon reviewing his medications, he was taking a full 25 mg of Toprol -XL and had associated hypotension.  Following decrease of Toprol  to the previously recommended 12.5 mg his BP had improved with resolution of dizziness.  Following PCI, he noted resolution of exertional chest discomfort and dyspnea.  He was seen in the office on 04/13/2021 and reported some episodes of hypotension with home BP cuff readings with intermittent positional dizziness.  He also noted several episodes of palpitations, though did not recall if these episodes occurred during episodes of dizziness.  Given episodes of hypotension, metoprolol  was discontinued.  Subsequent Zio patch demonstrated a predominant rhythm of sinus with an average heart rate of 68 bpm (range 47 to 174 bpm), 17 episodes of SVT with the fastest interval lasting 5 beats with a maximal rate of 174 bpm and the longest interval lasting 12 beats with an average rate of 107 bpm, occasional PACs, rare atrial couplets, atrial triplets, PVCs, ventricular couplets, and ventricular triplets.  He was  seen in the office in 05/2021 and was without further palpitations with noted improvement in his dizziness.  BP was stable in the low 100s to 1 teens systolic.  He was seen in the office in 06/2022 and continued to report bradycardic  heart rates as well as split-second lasting sharp right-sided chest discomfort that did not feel like his prior angina.  He continued to report fluctuations in BP ranging from 90s to 150s systolic.  He had not needed any as needed metoprolol .  He continued to note some dizziness without frank syncope.  With increased stress, he noted worsening of his tremor.  24-hour ambulatory BP monitor showed an overall average blood pressure 116/66 (with a range of 80-146/43-80) with a mean heart rate of 74 bpm.  Average awake BP was 114/65 with a mean heart rate of 76 bpm.  Average asleep blood pressure was 121/68 with a mean heart rate of 66 bpm.  He was seen in the office in 09/2022 continuing to note some improving exertional fatigue that he felt was similar to what he experienced leading up to his PCI in 2022.  Dizziness was stable.  Subsequent Lexiscan  MPI 09/2022 showed no evidence of ischemia or infarction LVEF of 68%.  CT attenuation corrected images showed mild coronary artery calcification and aortic atherosclerosis.  Overall, this was a low risk study.  He was last seen in the office in 02/2023 noting stable intermittent dizziness and fatigue with continued labile blood pressure readings.  Clopidogrel  was discontinued with recommendation to be maintained on aspirin  indefinitely.  He comes in accompanied by his wife today and is doing well from a cardiac perspective, without symptoms of angina or cardiac decompensation.  He does continue to note intermittent dizziness that is randomly occurring at times and also noticeable when gazing upwards.  Blood pressure continues to run intermittently soft, particularly in the mornings with 1 reading in the 70s systolic that improved with hydration.  Does not drink much water throughout the day.  No significant lower extremity swelling or progressive orthopnea.  No falls or symptoms concerning for bleeding.   Labs independently reviewed: 08/2023 - BUN 28, serum creatinine  1.27, potassium 4.4, albumin 4.3, Hgb 14.8, PLT 186 06/2023 - AST/ALT normal 05/2023 - TC 122, TG 73, HDL 46, LDL 61, TSH normal  Past Medical History:  Diagnosis Date   Anxiety    BPH (benign prostatic hyperplasia)    CAD in native artery    Cataract    bilateral- Dec 2021 and jan 2022   GERD (gastroesophageal reflux disease)    Hypercholesterolemia    Hyperlipidemia    Hypertension     Past Surgical History:  Procedure Laterality Date   APPENDECTOMY     CATARACT EXTRACTION, BILATERAL     COLONOSCOPY     CORONARY STENT INTERVENTION N/A 01/04/2021   Procedure: CORONARY STENT INTERVENTION;  Surgeon: Sammy Crisp, MD;  Location: [facility] INVASIVE CV LAB;  Service: Cardiovascular;  Laterality: N/A;   RIGHT/LEFT HEART CATH AND CORONARY ANGIOGRAPHY Bilateral 01/04/2021   Procedure: RIGHT/LEFT HEART CATH AND CORONARY ANGIOGRAPHY;  Surgeon: Sammy Crisp, MD;  Location: [facility] INVASIVE CV LAB;  Service: Cardiovascular;  Laterality: Bilateral;   TONSILLECTOMY      Current Medications: Current Meds  Medication Sig   aspirin  EC 81 MG EC tablet Take 1 tablet (81 mg total) by mouth daily. Swallow whole.   atorvastatin  (LIPITOR) 40 MG tablet TAKE 1 TABLET(40 MG) BY MOUTH DAILY   Bacillus Coagulans-Inulin (PROBIOTIC) 1-250 BILLION-MG  CAPS Take 1 capsule by mouth daily.   calcium  carbonate (TUMS EX) 750 MG chewable tablet Chew by mouth.   Cholecalciferol (VITAMIN D-1000 MAX ST) 25 MCG (1000 UT) tablet Take 1 tablet by mouth daily.   Cyanocobalamin  (VITAMIN B 12 PO) Take 1,000 mcg by mouth daily at 12 noon.   Polyethyl Glycol-Propyl Glycol (SYSTANE) 0.4-0.3 % GEL ophthalmic gel Place 1 Application into both eyes at bedtime.   primidone  (MYSOLINE ) 50 MG tablet Take 50 mg by mouth at bedtime.   Propylene Glycol (SYSTANE COMPLETE OP) Place 1 drop into both eyes in the morning, at noon, and at bedtime.   RESTASIS 0.05 % ophthalmic emulsion 1 drop 2 (two) times daily.   tamsulosin  (FLOMAX ) 0.4 MG  CAPS capsule Take 0.4 mg by mouth daily after supper.   Triamcinolone Acetonide (NASACORT ALLERGY 24HR NA) daily at 6 (six) AM.    Allergies:   Patient has no known allergies.   Social History   Socioeconomic History   Marital status: Married    Spouse name: Not on file   Number of children: Not on file   Years of education: Not on file   Highest education level: Not on file  Occupational History   Not on file  Tobacco Use   Smoking status: Former    Current packs/day: 0.00    Types: Cigarettes    Quit date: 2012    Years since quitting: 13.3   Smokeless tobacco: Never  Vaping Use   Vaping status: Never Used  Substance and Sexual Activity   Alcohol use: Yes    Comment: once a month liquor   Drug use: Never   Sexual activity: Not Currently  Other Topics Concern   Not on file  Social History Narrative   Not on file   Social Drivers of Health   Financial Resource Strain: Low Risk  (11/24/2022)   Received from [facility]   Overall Financial Resource Strain (CARDIA)    Difficulty of Paying Living Expenses: Not very hard  Food Insecurity: No Food Insecurity (11/24/2022)   Received from [facility]   Hunger Vital Sign    Worried About Running Out of Food in the Last Year: Never true    Ran Out of Food in the Last Year: Never true  Transportation Needs: No Transportation Needs (11/24/2022)   Received from [facility] - Transportation    In the past 12 months, has lack of transportation kept you from medical appointments or from getting medications?: No    Lack of Transportation (Non-Medical): No  Physical Activity: Not on file  Stress: Not on file  Social Connections: Not on file     Family History:  The patient's family history includes Alzheimer's disease in his father; Diabetes in his maternal grandmother.  ROS:   12-point review of systems is negative unless otherwise noted in the  HPI.   EKGs/Labs/Other Studies Reviewed:    Studies reviewed were summarized above. The additional studies were reviewed today:  Lexiscan  MPI 09/25/2022:   The study is normal. The study is low risk.   No ST deviation was noted.   LV perfusion is normal. There is no evidence of ischemia. There is no evidence of infarction.   Left ventricular function is normal. Nuclear stress EF: 68 %. End diastolic cavity size is normal. End systolic cavity size is normal.   CT attenuation images showed mild aortic and coronary calcifications. __________  24-hour ambulatory BP monitor 08/2022:     Blood pressure was monitored for 24 hours.   Overall average blood pressure was 116/66 (range 80-146/43-80) with mean heart rate of 74 bpm.   Average awake blood pressure was 114/65 with mean heart rate of 76 bpm.   Average asleep blood pressure was 121/68 with mean heart rate 66 bpm.  Asleep BP dip was -6.4% systolic and -4.8% diastolic. __________   Zio patch 03/2021: Patient had a min HR of 47 bpm, max HR of 174 bpm, and avg HR of 68 bpm. Predominant underlying rhythm was Sinus Rhythm. 17 Supraventricular Tachycardia runs occurred, the run with the fastest interval lasting 5 beats with a max rate of 174 bpm, the longest lasting 12 beats with an avg rate of 107 bpm. Isolated SVEs were occasional (1.2%, 15834), SVE Couplets were rare (<1.0%, 93), and SVE Triplets were rare (<1.0%, 22). Isolated VEs were rare (<1.0%, 10769), VE Couplets were rare (<1.0%, 11), and VE Triplets were rare (<1.0%, 1). Ventricular Bigeminy and Trigeminy were present.  __________   [facility] 12/2020: Conclusion Coronary artery disease including chronic total/subtotal occlusion of the mid RCA, and 30% proximal and mid LAD. Normal left ventricular end-diastolic pressure of 15 mmHg RA 8, RV 25/11, PCP WP 11, PA 29/10 (19) CO 6.77, CI 3.15 Successful PCI to the mid RCA with placement of a 3.5 x 22 mm Onyx DES with excellent angiographic  result and TIMI-3 flow   Recommendation Dual antiplatelet therapy with aspirin  and Plavix  for 6 months, ideally longer Ongoing aggressive secondary prevention Admit overnight for monitoring.  Discharge in the morning. __________   2D echo 11/16/2020: 1. Left ventricular ejection fraction, by estimation, is 45 to 50%. The  left ventricle has mildly decreased function. The left ventricle  demonstrates regional wall motion abnormalities (see scoring  diagram/findings for description). There is mild left  ventricular hypertrophy. Left ventricular diastolic parameters are  consistent with Grade I diastolic dysfunction (impaired relaxation). There  is dyskinesis of the left ventricular, basal-mid inferolateral wall and  inferior wall.   2. Right ventricular systolic function is normal. The right ventricular  size is normal.   3. The mitral valve was not well visualized. Trivial mitral valve  regurgitation.   4. The aortic valve was not well visualized. Aortic valve regurgitation  is not visualized. No aortic stenosis is present. __________   Lexiscan  MPI 11/17/2020: The study is normal. This is a low risk study. The left ventricular ejection fraction is hyperdynamic (>65%). There is no evidence for ischemia   EKG:  EKG is ordered today.  The EKG ordered today demonstrates NSR, 74 bpm, no acute ST-T changes  Recent Labs: 07/02/2023: ALT 17; BUN 18; Creatinine 1.31; Hemoglobin 14.6; Platelet Count 176; Potassium 3.9; Sodium 132  Recent Lipid Panel    Component Value Date/Time   CHOL 116 01/27/2021 0927   TRIG 99 01/27/2021 0927   HDL 36 (L) 01/27/2021 0927   CHOLHDL 3.2 01/27/2021 0927   VLDL 20 01/27/2021 0927   LDLCALC 60 01/27/2021 0927    PHYSICAL EXAM:    VS:  BP 118/68 (BP Location: Left Arm)   Pulse 74   Ht 5\' 11"  (1.803 m)   Wt 214 lb (97.1 kg)   SpO2 97%   BMI 29.85 kg/m   BMI: Body mass index is 29.85 kg/m.  Physical Exam Vitals reviewed.  Constitutional:       Appearance: He is well-developed.  HENT:     Head:  Normocephalic and atraumatic.  Eyes:     General:        Right eye: No discharge.        Left eye: No discharge.  Cardiovascular:     Rate and Rhythm: Normal rate and regular rhythm.     Heart sounds: Normal heart sounds, S1 normal and S2 normal. Heart sounds not distant. No midsystolic click and no opening snap. No murmur heard.    No friction rub.  Pulmonary:     Effort: Pulmonary effort is normal. No respiratory distress.     Breath sounds: Normal breath sounds. No decreased breath sounds, wheezing or rales.  Chest:     Chest wall: No tenderness.  Musculoskeletal:     Cervical back: Normal range of motion.  Skin:    General: Skin is warm and dry.     Nails: There is no clubbing.  Neurological:     Mental Status: He is alert and oriented to person, place, and time.     Motor: Tremor present.  Psychiatric:        Speech: Speech normal.        Behavior: Behavior normal.        Thought Content: Thought content normal.        Judgment: Judgment normal.     Wt Readings from Last 3 Encounters:  09/12/23 214 lb (97.1 kg)  07/09/23 220 lb 11.2 oz (100.1 kg)  03/14/23 211 lb 12.8 oz (96.1 kg)     ASSESSMENT & PLAN:   CAD involving the native coronary arteries without angina: He is doing well and without symptoms concerning for angina or cardiac decompensation.  Continue aggressive risk factor modification and secondary prevention including aspirin  81 mg indefinitely and atorvastatin  40 mg.  No longer on beta-blocker given fatigue and dizziness with associated hypotension.  No indication for further ischemic testing at this time.  Labile hypertension with chronic dizziness: Blood pressure is well-controlled in the office today.  Continues to note episodes of hypotension, particularly in the morning hours.  Liberalize salt intake.  If symptoms persist, could consider trial of low-dose midodrine.  Obtain carotid artery ultrasound.   Slow positional changes.  May benefit from compression socks.  PSVT: Quiescent.  No longer on AV nodal blocking pharmacotherapy given labile hypertension.  HLD: LDL 61 in 05/2023 with normal AST/ALT in 06/2023.  He remains on atorvastatin  40 mg.  CKD stage IIIa: Stable on check earlier this week.  Followed by nephrology.    Disposition: F/u with Dr. Nolan Battle or an APP in 6 months.   Medication Adjustments/Labs and Tests Ordered: Current medicines are reviewed at length with the patient today.  Concerns regarding medicines are outlined above. Medication changes, Labs and Tests ordered today are summarized above and listed in the Patient Instructions accessible in Encounters.   Signed, Varney Gentleman, PA-C 09/12/2023 12:18 PM     [facility] [place] [address] [place], [place] [address] [phone]

## 2023-09-11 DIAGNOSIS — I2510 Atherosclerotic heart disease of native coronary artery without angina pectoris: Secondary | ICD-10-CM | POA: Diagnosis not present

## 2023-09-11 DIAGNOSIS — N1831 Chronic kidney disease, stage 3a: Secondary | ICD-10-CM | POA: Diagnosis not present

## 2023-09-11 DIAGNOSIS — E7800 Pure hypercholesterolemia, unspecified: Secondary | ICD-10-CM | POA: Diagnosis not present

## 2023-09-12 ENCOUNTER — Ambulatory Visit: Payer: Medicare PPO | Attending: Physician Assistant | Admitting: Physician Assistant

## 2023-09-12 ENCOUNTER — Encounter: Payer: Self-pay | Admitting: Physician Assistant

## 2023-09-12 VITALS — BP 118/68 | HR 74 | Ht 71.0 in | Wt 214.0 lb

## 2023-09-12 DIAGNOSIS — R0989 Other specified symptoms and signs involving the circulatory and respiratory systems: Secondary | ICD-10-CM | POA: Diagnosis not present

## 2023-09-12 DIAGNOSIS — I471 Supraventricular tachycardia, unspecified: Secondary | ICD-10-CM

## 2023-09-12 DIAGNOSIS — N1831 Chronic kidney disease, stage 3a: Secondary | ICD-10-CM

## 2023-09-12 DIAGNOSIS — R42 Dizziness and giddiness: Secondary | ICD-10-CM

## 2023-09-12 DIAGNOSIS — E785 Hyperlipidemia, unspecified: Secondary | ICD-10-CM

## 2023-09-12 DIAGNOSIS — I2510 Atherosclerotic heart disease of native coronary artery without angina pectoris: Secondary | ICD-10-CM

## 2023-09-12 NOTE — Patient Instructions (Signed)
 Medication Instructions:  Your Physician recommend you continue on your current medication as directed.    *If you need a refill on your cardiac medications before your next appointment, please call your pharmacy*  Lab Work: None ordered at this time  If you have labs (blood work) drawn today and your tests are completely normal, you will receive your results only by: MyChart Message (if you have MyChart) OR A paper copy in the mail If you have any lab test that is abnormal or we need to change your treatment, we will call you to review the results.  Testing/Procedures: Your physician has requested that you have a carotid duplex. This test is an ultrasound of the carotid arteries in your neck. It looks at blood flow through these arteries that supply the brain with blood.   Allow one hour for this exam.  There are no restrictions or special instructions.  This will take place at [address] [facility]) [address], [place] [address]  Please note: We ask at that you not bring children with you during ultrasound (echo/ vascular) testing. Due to room size and safety concerns, children are not allowed in the ultrasound rooms during exams. Our front office staff cannot provide observation of children in our lobby area while testing is being conducted. An adult accompanying a patient to their appointment will only be allowed in the ultrasound room at the discretion of the ultrasound technician under special circumstances. We apologize for any inconvenience.  Follow-Up: At [facility], you and your health needs are our priority.  As part of our continuing mission to provide you with exceptional heart care, our providers are all part of one team.  This team includes your primary Cardiologist (physician) and Advanced Practice Providers or APPs (Physician Assistants and Nurse Practitioners) who all work together to provide you with the care you need, when you need it.  Your next  appointment:   6 month(s)  Provider:   You may see Sammy Crisp, MD or Varney Gentleman, PA-C

## 2023-09-17 DIAGNOSIS — E871 Hypo-osmolality and hyponatremia: Secondary | ICD-10-CM | POA: Diagnosis not present

## 2023-09-17 DIAGNOSIS — N1831 Chronic kidney disease, stage 3a: Secondary | ICD-10-CM | POA: Diagnosis not present

## 2023-09-24 DIAGNOSIS — N1831 Chronic kidney disease, stage 3a: Secondary | ICD-10-CM | POA: Diagnosis not present

## 2023-09-24 DIAGNOSIS — I951 Orthostatic hypotension: Secondary | ICD-10-CM | POA: Diagnosis not present

## 2023-09-24 DIAGNOSIS — E559 Vitamin D deficiency, unspecified: Secondary | ICD-10-CM | POA: Diagnosis not present

## 2023-09-24 DIAGNOSIS — G20A1 Parkinson's disease without dyskinesia, without mention of fluctuations: Secondary | ICD-10-CM | POA: Diagnosis not present

## 2023-10-02 DIAGNOSIS — D492 Neoplasm of unspecified behavior of bone, soft tissue, and skin: Secondary | ICD-10-CM | POA: Diagnosis not present

## 2023-10-10 ENCOUNTER — Ambulatory Visit: Payer: Self-pay | Admitting: Physician Assistant

## 2023-10-10 ENCOUNTER — Ambulatory Visit: Attending: Physician Assistant

## 2023-10-10 DIAGNOSIS — R42 Dizziness and giddiness: Secondary | ICD-10-CM

## 2023-10-18 DIAGNOSIS — E7800 Pure hypercholesterolemia, unspecified: Secondary | ICD-10-CM | POA: Diagnosis not present

## 2023-10-18 DIAGNOSIS — N1831 Chronic kidney disease, stage 3a: Secondary | ICD-10-CM | POA: Diagnosis not present

## 2023-10-18 DIAGNOSIS — I2510 Atherosclerotic heart disease of native coronary artery without angina pectoris: Secondary | ICD-10-CM | POA: Diagnosis not present

## 2023-11-23 DIAGNOSIS — E7800 Pure hypercholesterolemia, unspecified: Secondary | ICD-10-CM | POA: Diagnosis not present

## 2023-11-26 DIAGNOSIS — Z1331 Encounter for screening for depression: Secondary | ICD-10-CM | POA: Diagnosis not present

## 2023-11-26 DIAGNOSIS — F419 Anxiety disorder, unspecified: Secondary | ICD-10-CM | POA: Diagnosis not present

## 2023-11-26 DIAGNOSIS — N401 Enlarged prostate with lower urinary tract symptoms: Secondary | ICD-10-CM | POA: Diagnosis not present

## 2023-11-26 DIAGNOSIS — R251 Tremor, unspecified: Secondary | ICD-10-CM | POA: Diagnosis not present

## 2023-12-01 ENCOUNTER — Other Ambulatory Visit: Payer: Self-pay | Admitting: Physician Assistant

## 2023-12-19 ENCOUNTER — Other Ambulatory Visit: Payer: Self-pay

## 2023-12-19 DIAGNOSIS — N401 Enlarged prostate with lower urinary tract symptoms: Secondary | ICD-10-CM

## 2023-12-20 ENCOUNTER — Ambulatory Visit: Admitting: Urology

## 2023-12-20 ENCOUNTER — Other Ambulatory Visit
Admission: RE | Admit: 2023-12-20 | Discharge: 2023-12-20 | Disposition: A | Discharge status: Home / Self Care | Attending: Urology | Admitting: Urology

## 2023-12-20 VITALS — BP 121/70 | HR 84 | Ht 71.0 in | Wt 205.0 lb

## 2023-12-20 DIAGNOSIS — Z125 Encounter for screening for malignant neoplasm of prostate: Secondary | ICD-10-CM | POA: Diagnosis not present

## 2023-12-20 DIAGNOSIS — N401 Enlarged prostate with lower urinary tract symptoms: Secondary | ICD-10-CM | POA: Diagnosis not present

## 2023-12-20 DIAGNOSIS — N3281 Overactive bladder: Secondary | ICD-10-CM | POA: Diagnosis not present

## 2023-12-20 DIAGNOSIS — R350 Frequency of micturition: Secondary | ICD-10-CM

## 2023-12-20 LAB — BLADDER SCAN AMB NON-IMAGING

## 2023-12-20 MED ORDER — TROSPIUM CHLORIDE ER 60 MG PO CP24: 60.0000 mg | ORAL_CAPSULE | Freq: Every day | ORAL | 11 refills | Status: AC

## 2023-12-20 NOTE — Progress Notes (Signed)
   12/20/23 10:54 AM   Steve Warren 09/28/1945 [id number]  CC: Urinary symptoms, PSA screening  HPI: 78 year old male with Parkinson's disease here with his wife today for further evaluation of urinary symptoms.  His wife provides most of the history.  He reports a long history of nocturia 3-5 times overnight, some frequency during the day.  He has been on Flomax  long-term.  He was recently diagnosed with Parkinson's in January 2025, but sounds like had symptoms for longer than that.  He denies any dysuria or gross hematuria.  Renal function normal with creatinine 1.1, EGFR greater than 60.  PVR today normal at 0ml.   PSA August 2023 normal at 2.3  PMH: Past Medical History:  Diagnosis Date   Anxiety    BPH (benign prostatic hyperplasia)    CAD in native artery    Cataract    bilateral- Dec 2021 and jan 2022   GERD (gastroesophageal reflux disease)    Hypercholesterolemia    Hyperlipidemia    Hypertension     Surgical History: Past Surgical History:  Procedure Laterality Date   APPENDECTOMY     CATARACT EXTRACTION, BILATERAL     COLONOSCOPY     CORONARY STENT INTERVENTION N/A 01/04/2021   Procedure: CORONARY STENT INTERVENTION;  Surgeon: Mady Bruckner, MD;  Location: [facility] INVASIVE CV LAB;  Service: Cardiovascular;  Laterality: N/A;   RIGHT/LEFT HEART CATH AND CORONARY ANGIOGRAPHY Bilateral 01/04/2021   Procedure: RIGHT/LEFT HEART CATH AND CORONARY ANGIOGRAPHY;  Surgeon: Mady Bruckner, MD;  Location: [facility] INVASIVE CV LAB;  Service: Cardiovascular;  Laterality: Bilateral;   TONSILLECTOMY        Family History: Family History  Problem Relation Age of Onset   Alzheimer's disease Father    Diabetes Maternal Grandmother     Social History:  reports that he quit smoking about 13 years ago. His smoking use included cigarettes. He has never used smokeless tobacco. He reports current alcohol use. He reports that he does not use drugs.  Physical Exam: BP 121/70 (BP  Location: Left Arm, Patient Position: Sitting, Cuff Size: Normal)   Pulse 84   Ht 5' 11 (1.803 m)   Wt 205 lb (93 kg)   SpO2 95%   BMI 28.59 kg/m    Constitutional:  Alert and oriented, No acute distress. Cardiovascular: No clubbing, cyanosis, or edema. Respiratory: Normal respiratory effort, no increased work of breathing. GI: Abdomen is soft, nontender, nondistended, no abdominal masses   Laboratory Data: Reviewed  Pertinent Imaging: I have personally viewed and interpreted the CT scan from July 2022 showing a 47 g prostate.  Assessment & Plan:   78 year old male with Parkinson's disease and long-term nocturia and primarily overactive symptoms.  We discussed the correlation between Parkinson's disease and overactive bladder.  Behavioral strategies were discussed.  We discussed the pros and cons of trospium , and they are unsure if they would like to trial that medication.  I recommended continuing his long-term Flomax , but that could be discontinued if he has benefit on the trospium .  Nocturia strategies discussed.  Trial of trospium , they are unsure if they would like to fill this medication and will think about it.  They will contact us  for follow-up  Redell Burnet, MD 12/20/2023  [facility] [address], [address] [place], [place] [address] [phone]

## 2023-12-20 NOTE — Patient Instructions (Signed)

## 2024-01-01 DIAGNOSIS — N1831 Chronic kidney disease, stage 3a: Secondary | ICD-10-CM | POA: Diagnosis not present

## 2024-01-01 DIAGNOSIS — Z0000 Encounter for general adult medical examination without abnormal findings: Secondary | ICD-10-CM | POA: Diagnosis not present

## 2024-01-01 DIAGNOSIS — E871 Hypo-osmolality and hyponatremia: Secondary | ICD-10-CM | POA: Diagnosis not present

## 2024-01-01 DIAGNOSIS — Z1331 Encounter for screening for depression: Secondary | ICD-10-CM | POA: Diagnosis not present

## 2024-01-01 DIAGNOSIS — R251 Tremor, unspecified: Secondary | ICD-10-CM | POA: Diagnosis not present

## 2024-01-01 DIAGNOSIS — N401 Enlarged prostate with lower urinary tract symptoms: Secondary | ICD-10-CM | POA: Diagnosis not present

## 2024-01-01 DIAGNOSIS — K219 Gastro-esophageal reflux disease without esophagitis: Secondary | ICD-10-CM | POA: Diagnosis not present

## 2024-01-01 DIAGNOSIS — E7800 Pure hypercholesterolemia, unspecified: Secondary | ICD-10-CM | POA: Diagnosis not present

## 2024-01-01 DIAGNOSIS — D472 Monoclonal gammopathy: Secondary | ICD-10-CM | POA: Diagnosis not present

## 2024-01-08 ENCOUNTER — Other Ambulatory Visit: Payer: Self-pay | Admitting: Medical Genetics

## 2024-01-11 ENCOUNTER — Other Ambulatory Visit
Admission: RE | Admit: 2024-01-11 | Discharge: 2024-01-11 | Disposition: A | Discharge status: Home / Self Care | Payer: Self-pay | Source: Ambulatory Visit | Attending: Medical Genetics | Admitting: Medical Genetics

## 2024-01-17 DIAGNOSIS — B070 Plantar wart: Secondary | ICD-10-CM | POA: Diagnosis not present

## 2024-01-17 DIAGNOSIS — Z1331 Encounter for screening for depression: Secondary | ICD-10-CM | POA: Diagnosis not present

## 2024-01-22 DIAGNOSIS — G20C Parkinsonism, unspecified: Secondary | ICD-10-CM | POA: Diagnosis not present

## 2024-01-22 DIAGNOSIS — R2681 Unsteadiness on feet: Secondary | ICD-10-CM | POA: Diagnosis not present

## 2024-01-22 DIAGNOSIS — R2689 Other abnormalities of gait and mobility: Secondary | ICD-10-CM | POA: Diagnosis not present

## 2024-01-22 LAB — GENECONNECT MOLECULAR SCREEN: Genetic Analysis Overall Interpretation: NEGATIVE

## 2024-01-25 DIAGNOSIS — H18523 Epithelial (juvenile) corneal dystrophy, bilateral: Secondary | ICD-10-CM | POA: Diagnosis not present

## 2024-01-28 DIAGNOSIS — G20A1 Parkinson's disease without dyskinesia, without mention of fluctuations: Secondary | ICD-10-CM | POA: Diagnosis not present

## 2024-01-28 DIAGNOSIS — G250 Essential tremor: Secondary | ICD-10-CM | POA: Diagnosis not present

## 2024-01-28 DIAGNOSIS — E559 Vitamin D deficiency, unspecified: Secondary | ICD-10-CM | POA: Diagnosis not present

## 2024-01-30 DIAGNOSIS — L600 Ingrowing nail: Secondary | ICD-10-CM | POA: Diagnosis not present

## 2024-01-30 DIAGNOSIS — D2372 Other benign neoplasm of skin of left lower limb, including hip: Secondary | ICD-10-CM | POA: Diagnosis not present

## 2024-02-04 DIAGNOSIS — R2681 Unsteadiness on feet: Secondary | ICD-10-CM | POA: Diagnosis not present

## 2024-02-04 DIAGNOSIS — G20C Parkinsonism, unspecified: Secondary | ICD-10-CM | POA: Diagnosis not present

## 2024-02-04 DIAGNOSIS — R2689 Other abnormalities of gait and mobility: Secondary | ICD-10-CM | POA: Diagnosis not present

## 2024-02-07 DIAGNOSIS — G20C Parkinsonism, unspecified: Secondary | ICD-10-CM | POA: Diagnosis not present

## 2024-02-07 DIAGNOSIS — R2681 Unsteadiness on feet: Secondary | ICD-10-CM | POA: Diagnosis not present

## 2024-02-07 DIAGNOSIS — R2689 Other abnormalities of gait and mobility: Secondary | ICD-10-CM | POA: Diagnosis not present

## 2024-02-12 DIAGNOSIS — G20C Parkinsonism, unspecified: Secondary | ICD-10-CM | POA: Diagnosis not present

## 2024-02-12 DIAGNOSIS — R2689 Other abnormalities of gait and mobility: Secondary | ICD-10-CM | POA: Diagnosis not present

## 2024-02-12 DIAGNOSIS — R2681 Unsteadiness on feet: Secondary | ICD-10-CM | POA: Diagnosis not present

## 2024-02-14 DIAGNOSIS — G20C Parkinsonism, unspecified: Secondary | ICD-10-CM | POA: Diagnosis not present

## 2024-02-14 DIAGNOSIS — R2689 Other abnormalities of gait and mobility: Secondary | ICD-10-CM | POA: Diagnosis not present

## 2024-02-14 DIAGNOSIS — R2681 Unsteadiness on feet: Secondary | ICD-10-CM | POA: Diagnosis not present

## 2024-02-15 ENCOUNTER — Telehealth: Payer: Self-pay

## 2024-02-15 ENCOUNTER — Telehealth: Payer: Self-pay | Admitting: *Deleted

## 2024-02-15 NOTE — Telephone Encounter (Signed)
 Wife called saying there is something weird about the AVS seen here.  He wanted to know what is going on.  The only thing that I can say is that they did cancel 07/21/2024 to see the doctor and also canceled 07/07/24 labs  And then changed it to June 30, 2024 at 9/30 and 07/14/2024 10 AM to see Dr. Melanee that is the only things that I see that was a little weird.

## 2024-02-15 NOTE — Telephone Encounter (Signed)
 The wife said that there was no weird thing after AVS the day that he was saw.

## 2024-02-15 NOTE — Telephone Encounter (Signed)
 Voicemail received from caregiver / spouse Avelina today 02/15/24 at 1:31PM stating the following: Spouse states she received a strange communication yesterday from an after visit summary (patient was seen at [facility] and there's a telephone note dated from today in [facility] chart). Caregiver indicates that the AVS reads she saw Dr. Melanee on 07/14/24 (which is her next scheduled appointment with Dr. Melanee) saying he doesn't have an active treatment plan on onc type.  States AVS on the bottom indicates it was printed 02/07/24 at 11:46am. Best call back number is [phone].  Joen discussed with patient after this voicemail was left; no further follow up needed

## 2024-02-18 DIAGNOSIS — G20C Parkinsonism, unspecified: Secondary | ICD-10-CM | POA: Diagnosis not present

## 2024-02-18 DIAGNOSIS — R2681 Unsteadiness on feet: Secondary | ICD-10-CM | POA: Diagnosis not present

## 2024-02-18 DIAGNOSIS — R2689 Other abnormalities of gait and mobility: Secondary | ICD-10-CM | POA: Diagnosis not present

## 2024-02-20 DIAGNOSIS — R2681 Unsteadiness on feet: Secondary | ICD-10-CM | POA: Diagnosis not present

## 2024-02-20 DIAGNOSIS — G20C Parkinsonism, unspecified: Secondary | ICD-10-CM | POA: Diagnosis not present

## 2024-02-20 DIAGNOSIS — R2689 Other abnormalities of gait and mobility: Secondary | ICD-10-CM | POA: Diagnosis not present

## 2024-02-25 DIAGNOSIS — R2681 Unsteadiness on feet: Secondary | ICD-10-CM | POA: Diagnosis not present

## 2024-02-25 DIAGNOSIS — G20C Parkinsonism, unspecified: Secondary | ICD-10-CM | POA: Diagnosis not present

## 2024-02-25 DIAGNOSIS — R2689 Other abnormalities of gait and mobility: Secondary | ICD-10-CM | POA: Diagnosis not present

## 2024-02-27 DIAGNOSIS — R2689 Other abnormalities of gait and mobility: Secondary | ICD-10-CM | POA: Diagnosis not present

## 2024-02-27 DIAGNOSIS — R2681 Unsteadiness on feet: Secondary | ICD-10-CM | POA: Diagnosis not present

## 2024-02-27 DIAGNOSIS — G20C Parkinsonism, unspecified: Secondary | ICD-10-CM | POA: Diagnosis not present

## 2024-03-03 DIAGNOSIS — R2689 Other abnormalities of gait and mobility: Secondary | ICD-10-CM | POA: Diagnosis not present

## 2024-03-03 DIAGNOSIS — R2681 Unsteadiness on feet: Secondary | ICD-10-CM | POA: Diagnosis not present

## 2024-03-03 DIAGNOSIS — G20C Parkinsonism, unspecified: Secondary | ICD-10-CM | POA: Diagnosis not present

## 2024-03-04 DIAGNOSIS — G20C Parkinsonism, unspecified: Secondary | ICD-10-CM | POA: Diagnosis not present

## 2024-03-04 DIAGNOSIS — R2689 Other abnormalities of gait and mobility: Secondary | ICD-10-CM | POA: Diagnosis not present

## 2024-03-04 DIAGNOSIS — R2681 Unsteadiness on feet: Secondary | ICD-10-CM | POA: Diagnosis not present

## 2024-03-09 NOTE — Progress Notes (Unsigned)
 Cardiology Office Note    Date:  03/10/2024   ID:  [name], [name] [birth year], MRN [id number]  PCP:  Steve Cheryl BRAVO, MD  Cardiologist:  Steve Hanson, MD  Electrophysiologist:  None   Chief Complaint: Follow-up  History of Present Illness:   Steve Warren is a 78 y.o. male with history of CAD status post PCI to the RCA on 01/04/2021, aortic atherosclerosis, pSVT, CKD stage IIIa, MGUS, Parkinson's disease, chronic dizziness, HLD, BPH, and GERD who presents for follow up of follow-up of CAD.   He was admitted to [facility] in 11/2020 with a 4-day history of intermittent chest discomfort described as a sharp sensation in the center of his chest that radiated to his back and seemed to be associated primarily with activity, though also wondered if eating precipitated it.  There was some shortness of breath when the pain occurred.  High-sensitivity troponin peaked at 86.  BNP 112.  EKG demonstrated sinus rhythm with no acute ischemic ST-T changes. Chest x-ray showed low lung volumes without acute abnormality.  CTA chest/abdomen/pelvis showed no acute vascular abnormality with aortic atherosclerosis as well as at least two-vessel coronary artery calcifications, stable pulmonary nodules, colonic diverticulosis without acute diverticulitis, and severe degenerative changes within the lower lumbar spine noted.  Echo showed an EF of 45 to 50%, mild LVH, dyskinesis of the left ventricular, basal mid inferolateral wall, and inferior wall, grade 1 diastolic dysfunction, normal RV systolic function and ventricular cavity size, and trivial mitral regurgitation.  Lexiscan  MPI showed no evidence of significant ischemia and was overall low risk.   He was seen in follow-up later in 11/2020 and continued to note randomly occurring intermittent episodes of chest discomfort that were not as severe as what he experienced leading to his hospital admission.  It was noted he was experiencing episodes of hypotension  following transition from Toprol  to carvedilol  by outside office.  In this setting, he was transitioned back to Toprol .  Given his stable to improving symptoms, continued medical therapy was recommended.  However, we received a message later that month that he was experiencing worsening exertional dyspnea and angina that improved with rest.  In this setting, he underwent diagnostic R/LHC on 01/04/2021 which demonstrated chronic total/subtotal occlusion of the mid RCA as well as 30% proximal and mid LAD stenoses.  He underwent successful PCI to the mid RCA.  Normal LVEDP.   He was seen in hospital follow-up in 01/2021 and was experiencing some dizziness.  Upon reviewing his medications, he was taking a full 25 mg of Toprol -XL and had associated hypotension.  Following decrease of Toprol  to the previously recommended 12.5 mg his BP had improved with resolution of dizziness.  Following PCI, he noted resolution of exertional chest discomfort and dyspnea.  He was seen in the office on 04/13/2021 and reported some episodes of hypotension with home BP cuff readings with intermittent positional dizziness.  He also noted several episodes of palpitations, though did not recall if these episodes occurred during episodes of dizziness.  Given episodes of hypotension, metoprolol  was discontinued.  Subsequent Zio patch demonstrated a predominant rhythm of sinus with an average heart rate of 68 bpm (range 47 to 174 bpm), 17 episodes of SVT with the fastest interval lasting 5 beats with a maximal rate of 174 bpm and the longest interval lasting 12 beats with an average rate of 107 bpm, occasional PACs, rare atrial couplets, atrial triplets, PVCs, ventricular couplets, and ventricular triplets.  He was seen  in the office in 05/2021 and was without further palpitations with noted improvement in his dizziness.  He was seen in the office in 06/2022 and continued to report bradycardic heart rates as well as split-second lasting sharp  right-sided chest discomfort that did not feel like his prior angina.  He continued to report fluctuations in BP ranging from 90s to 150s systolic.  He had not needed any as needed metoprolol .  He continued to note some dizziness without frank syncope.  With increased stress, he noted worsening of his tremor.  24-hour ambulatory BP monitor showed an overall average blood pressure 116/66 (with a range of 80-146/43-80) with a mean heart rate of 74 bpm.  Average awake BP was 114/65 with a mean heart rate of 76 bpm.  Average asleep blood pressure was 121/68 with a mean heart rate of 66 bpm.  He was seen in the office in 09/2022 continuing to note some improving exertional fatigue that he felt was similar to what he experienced leading up to his PCI in 2022.  Dizziness was stable.  Subsequent Lexiscan  MPI 09/2022 showed no evidence of ischemia or infarction LVEF of 68%.  CT attenuation corrected images showed mild coronary artery calcification and aortic atherosclerosis.  Overall, this was a low risk study.  He was seen in the office in 02/2023 noting stable intermittent dizziness and fatigue with continued labile blood pressure readings.  Clopidogrel  was discontinued with recommendation to be maintained on aspirin  indefinitely.  He was last seen in the office in 08/2023 noting chronic intermittent dizziness that was randomly occurring and more noticeable when gazing upwards as well as intermittent low blood pressures, particularly in the mornings with readings at time in the 70s mmHg, improved with hydration.   Liberalizing of salt was recommended.  He comes in accompanied by his wife today and is without symptoms of angina or cardiac decompensation.  Intermittent dizziness persists and is stable, mostly noted first thing in the morning.  He will occasionally notice readings in the 90s mmHg systolic.  They reported 2 isolated BP readings in the 70s mmHg systolic since he was last seen.  BP has improved with  supplementation with Gatorade.  No near-syncope or frank syncope.  No falls or symptoms concerning for bleeding.   Labs independently reviewed: 11/2023 - potassium 4.5, BUN 20, serum creatinine 1.1, albumin 4.1, AST/ALT normal, TC 113, TG 84, HDL 41, LDL 55, Hgb 14.8, PLT 186 12/7972 - TSH normal  Past Medical History:  Diagnosis Date   Anxiety    BPH (benign prostatic hyperplasia)    CAD in native artery    Cataract    bilateral- Dec 2021 and jan 2022   GERD (gastroesophageal reflux disease)    Hypercholesterolemia    Hyperlipidemia    Hypertension     Past Surgical History:  Procedure Laterality Date   APPENDECTOMY     CATARACT EXTRACTION, BILATERAL     COLONOSCOPY     CORONARY STENT INTERVENTION N/A 01/04/2021   Procedure: CORONARY STENT INTERVENTION;  Surgeon: Mady Bruckner, MD;  Location: [facility] INVASIVE CV LAB;  Service: Cardiovascular;  Laterality: N/A;   RIGHT/LEFT HEART CATH AND CORONARY ANGIOGRAPHY Bilateral 01/04/2021   Procedure: RIGHT/LEFT HEART CATH AND CORONARY ANGIOGRAPHY;  Surgeon: Mady Bruckner, MD;  Location: [facility] INVASIVE CV LAB;  Service: Cardiovascular;  Laterality: Bilateral;   TONSILLECTOMY      Current Medications: Current Meds  Medication Sig   aspirin  EC 81 MG EC tablet Take 1 tablet (81 mg total)  by mouth daily. Swallow whole.   atorvastatin  (LIPITOR) 40 MG tablet TAKE 1 TABLET(40 MG) BY MOUTH DAILY   Bacillus Coagulans-Inulin (PROBIOTIC) 1-250 BILLION-MG CAPS Take 1 capsule by mouth daily.   calcium  carbonate (TUMS EX) 750 MG chewable tablet Chew by mouth.   carbidopa-levodopa (SINEMET IR) 25-100 MG tablet Take 1 tablet by mouth 3 (three) times daily.   Cholecalciferol (VITAMIN D-1000 MAX ST) 25 MCG (1000 UT) tablet Take 1 tablet by mouth daily.   Cyanocobalamin  (VITAMIN B 12 PO) Take 1,000 mcg by mouth daily at 12 noon.   midodrine (PROAMATINE) 5 MG tablet Take 1 tablet (5 mg total) by mouth daily as needed (for systolic (top number) Blood  Pressure less than 100 mmHg).   Polyethyl Glycol-Propyl Glycol (SYSTANE) 0.4-0.3 % GEL ophthalmic gel Place 1 Application into both eyes at bedtime.   primidone  (MYSOLINE ) 50 MG tablet Take 50 mg by mouth at bedtime.   Propylene Glycol (SYSTANE COMPLETE OP) Place 1 drop into both eyes in the morning, at noon, and at bedtime.   RESTASIS 0.05 % ophthalmic emulsion 1 drop 2 (two) times daily.   sertraline (ZOLOFT) 25 MG tablet Take 25 mg by mouth daily.   tamsulosin  (FLOMAX ) 0.4 MG CAPS capsule Take 0.4 mg by mouth daily after supper.   Triamcinolone Acetonide (NASACORT ALLERGY 24HR NA) daily at 6 (six) AM.   Trospium  Chloride 60 MG CP24 Take 1 capsule (60 mg total) by mouth daily.    Allergies:   Patient has no known allergies.   Social History   Socioeconomic History   Marital status: Married    Spouse name: Not on file   Number of children: Not on file   Years of education: Not on file   Highest education level: Not on file  Occupational History   Not on file  Tobacco Use   Smoking status: Former    Current packs/day: 0.00    Types: Cigarettes    Quit date: 2012    Years since quitting: 13.8   Smokeless tobacco: Never  Vaping Use   Vaping status: Never Used  Substance and Sexual Activity   Alcohol use: Yes    Comment: once a month liquor   Drug use: Never   Sexual activity: Not Currently  Other Topics Concern   Not on file  Social History Narrative   Not on file   Social Drivers of Health   Financial Resource Strain: Low Risk  (01/01/2024)   Received from [facility]   Overall Financial Resource Strain (CARDIA)    Difficulty of Paying Living Expenses: Not hard at all  Food Insecurity: No Food Insecurity (01/01/2024)   Received from [facility]   Hunger Vital Sign    Within the past 12 months, you worried that your food would run out before you got the money to buy more.: Never true    Within the past 12 months, the food you bought  just didn't last and you didn't have money to get more.: Never true  Transportation Needs: No Transportation Needs (01/01/2024)   Received from [facility] - Transportation    In the past 12 months, has lack of transportation kept you from medical appointments or from getting medications?: No    Lack of Transportation (Non-Medical): No  Physical Activity: Not on file  Stress: Not on file  Social Connections: Not on file     Family History:  The patient's family history  includes Alzheimer's disease in his father; Diabetes in his maternal grandmother.  ROS:   12-point review of systems is negative unless otherwise noted in the HPI.   EKGs/Labs/Other Studies Reviewed:    Studies reviewed were summarized above. The additional studies were reviewed today:  Carotid artery ultrasound 10/10/2023: Summary:  Right Carotid: There was no evidence of thrombus, dissection,  atherosclerotic plaque or stenosis in the cervical carotid system.   Left Carotid: The extracranial vessels were near-normal with only minimal  wall thickening or plaque.   Vertebrals:  Bilateral vertebral arteries demonstrate antegrade flow.  Subclavians: Normal flow hemodynamics were seen in bilateral subclavian arteries.  __________  Lexiscan  MPI 09/25/2022:   The study is normal. The study is low risk.   No ST deviation was noted.   LV perfusion is normal. There is no evidence of ischemia. There is no evidence of infarction.   Left ventricular function is normal. Nuclear stress EF: 68 %. End diastolic cavity size is normal. End systolic cavity size is normal.   CT attenuation images showed mild aortic and coronary calcifications. __________   24-hour ambulatory BP monitor 08/2022:     Blood pressure was monitored for 24 hours.   Overall average blood pressure was 116/66 (range 80-146/43-80) with mean heart rate of 74 bpm.   Average awake blood pressure was 114/65 with mean heart rate of  76 bpm.   Average asleep blood pressure was 121/68 with mean heart rate 66 bpm.  Asleep BP dip was -6.4% systolic and -4.8% diastolic. __________   Zio patch 03/2021: Patient had a min HR of 47 bpm, max HR of 174 bpm, and avg HR of 68 bpm. Predominant underlying rhythm was Sinus Rhythm. 17 Supraventricular Tachycardia runs occurred, the run with the fastest interval lasting 5 beats with a max rate of 174 bpm, the longest lasting 12 beats with an avg rate of 107 bpm. Isolated SVEs were occasional (1.2%, 15834), SVE Couplets were rare (<1.0%, 93), and SVE Triplets were rare (<1.0%, 22). Isolated VEs were rare (<1.0%, 10769), VE Couplets were rare (<1.0%, 11), and VE Triplets were rare (<1.0%, 1). Ventricular Bigeminy and Trigeminy were present.  __________   [facility] 12/2020: Conclusion Coronary artery disease including chronic total/subtotal occlusion of the mid RCA, and 30% proximal and mid LAD. Normal left ventricular end-diastolic pressure of 15 mmHg RA 8, RV 25/11, PCP WP 11, PA 29/10 (19) CO 6.77, CI 3.15 Successful PCI to the mid RCA with placement of a 3.5 x 22 mm Onyx DES with excellent angiographic result and TIMI-3 flow   Recommendation Dual antiplatelet therapy with aspirin  and Plavix  for 6 months, ideally longer Ongoing aggressive secondary prevention Admit overnight for monitoring.  Discharge in the morning. __________   2D echo 11/16/2020: 1. Left ventricular ejection fraction, by estimation, is 45 to 50%. The  left ventricle has mildly decreased function. The left ventricle  demonstrates regional wall motion abnormalities (see scoring  diagram/findings for description). There is mild left  ventricular hypertrophy. Left ventricular diastolic parameters are  consistent with Grade I diastolic dysfunction (impaired relaxation). There  is dyskinesis of the left ventricular, basal-mid inferolateral wall and  inferior wall.   2. Right ventricular systolic function is normal. The right  ventricular  size is normal.   3. The mitral valve was not well visualized. Trivial mitral valve  regurgitation.   4. The aortic valve was not well visualized. Aortic valve regurgitation  is not visualized. No aortic stenosis is present. __________  Lexiscan  MPI 11/17/2020: The study is normal. This is a low risk study. The left ventricular ejection fraction is hyperdynamic (>65%). There is no evidence for ischemia   EKG:  EKG is ordered today.  The EKG ordered today demonstrates NSR, 75 bpm, baseline artifact, no acute ST-T changes  Recent Labs: 07/02/2023: ALT 17; BUN 18; Creatinine 1.31; Hemoglobin 14.6; Platelet Count 176; Potassium 3.9; Sodium 132  Recent Lipid Panel    Component Value Date/Time   CHOL 116 01/27/2021 0927   TRIG 99 01/27/2021 0927   HDL 36 (L) 01/27/2021 0927   CHOLHDL 3.2 01/27/2021 0927   VLDL 20 01/27/2021 0927   LDLCALC 60 01/27/2021 0927    PHYSICAL EXAM:    VS:  BP 90/60 (BP Location: Left Arm, Patient Position: Sitting, Cuff Size: Normal)   Pulse 75 Comment: 85 oximeter  Ht 5' 11 (1.803 m)   Wt 215 lb 12.8 oz (97.9 kg)   SpO2 95%   BMI 30.10 kg/m   BMI: Body mass index is 30.1 kg/m.  Physical Exam Vitals reviewed.  Constitutional:      Appearance: He is well-developed.  HENT:     Head: Normocephalic and atraumatic.  Eyes:     General:        Right eye: No discharge.        Left eye: No discharge.  Cardiovascular:     Rate and Rhythm: Normal rate and regular rhythm.     Heart sounds: Normal heart sounds, S1 normal and S2 normal. Heart sounds not distant. No midsystolic click and no opening snap. No murmur heard.    No friction rub.  Pulmonary:     Effort: Pulmonary effort is normal. No respiratory distress.     Breath sounds: Normal breath sounds. No decreased breath sounds, wheezing, rhonchi or rales.  Musculoskeletal:     Cervical back: Normal range of motion.     Right lower leg: No edema.     Left lower leg: No edema.   Skin:    General: Skin is warm and dry.     Nails: There is no clubbing.  Neurological:     Mental Status: He is alert and oriented to person, place, and time.     Motor: Tremor present.  Psychiatric:        Speech: Speech normal.        Behavior: Behavior normal.        Thought Content: Thought content normal.        Judgment: Judgment normal.     Wt Readings from Last 3 Encounters:  03/10/24 215 lb 12.8 oz (97.9 kg)  12/20/23 205 lb (93 kg)  09/12/23 214 lb (97.1 kg)     ASSESSMENT & PLAN:   CAD involving the native coronary arteries without angina: He is doing well and without symptoms concerning for angina or cardiac decompensation.  Continue aggressive risk factor modification and secondary prevention including aspirin  81 mg indefinitely and atorvastatin  40 mg.  No indication for further ischemic testing at this time.  Labile hypertension with chronic dizziness: Blood pressure is soft in the office today at 90/60 with repeat blood pressure 92/60.  We will add midodrine 5 mg to be taken daily as needed for systolic blood pressure less than 100 mmHg.  Liberalize salt intake.  Slow positional changes.  Would benefit from compression socks.  Labile blood pressures likely to progress with underlying Parkinson's.  PSVT: Quiescent.  No longer on AV nodal blocking pharmacotherapy given labile hypertension.  HLD: LDL 55 in 11/2023.  Remains on atorvastatin  40 mg.    Disposition: F/u with Dr. Mady or an APP in 5 months.   Medication Adjustments/Labs and Tests Ordered: Current medicines are reviewed at length with the patient today.  Concerns regarding medicines are outlined above. Medication changes, Labs and Tests ordered today are summarized above and listed in the Patient Instructions accessible in Encounters.   Signed, Bernardino Bring, PA-C 03/10/2024 12:59 PM     [facility] [address] [place], [place] [address] [phone]

## 2024-03-10 ENCOUNTER — Ambulatory Visit: Attending: Physician Assistant | Admitting: Physician Assistant

## 2024-03-10 ENCOUNTER — Encounter: Payer: Self-pay | Admitting: Physician Assistant

## 2024-03-10 VITALS — BP 90/60 | HR 75 | Ht 71.0 in | Wt 215.8 lb

## 2024-03-10 DIAGNOSIS — R42 Dizziness and giddiness: Secondary | ICD-10-CM | POA: Diagnosis not present

## 2024-03-10 DIAGNOSIS — R0989 Other specified symptoms and signs involving the circulatory and respiratory systems: Secondary | ICD-10-CM | POA: Diagnosis not present

## 2024-03-10 DIAGNOSIS — Z79899 Other long term (current) drug therapy: Secondary | ICD-10-CM | POA: Diagnosis not present

## 2024-03-10 DIAGNOSIS — I2510 Atherosclerotic heart disease of native coronary artery without angina pectoris: Secondary | ICD-10-CM | POA: Diagnosis not present

## 2024-03-10 DIAGNOSIS — I471 Supraventricular tachycardia, unspecified: Secondary | ICD-10-CM

## 2024-03-10 DIAGNOSIS — E785 Hyperlipidemia, unspecified: Secondary | ICD-10-CM

## 2024-03-10 MED ORDER — MIDODRINE HCL 5 MG PO TABS: 5.0000 mg | ORAL_TABLET | Freq: Every day | ORAL | 3 refills | Status: AC | PRN

## 2024-03-10 NOTE — Patient Instructions (Signed)
 Medication Instructions:  Your physician recommends the following medication changes.  START TAKING: Midodrine 5 mg daily as needed for Systolic (top number) blood pressure less than 100 mmHg  *If you need a refill on your cardiac medications before your next appointment, please call your pharmacy*  Lab Work: Your provider would like for you to have following labs drawn today BMeT.   If you have labs (blood work) drawn today and your tests are completely normal, you will receive your results only by: MyChart Message (if you have MyChart) OR A paper copy in the mail If you have any lab test that is abnormal or we need to change your treatment, we will call you to review the results.  Follow-Up: At [facility], you and your health needs are our priority.  As part of our continuing mission to provide you with exceptional heart care, our providers are all part of one team.  This team includes your primary Cardiologist (physician) and Advanced Practice Providers or APPs (Physician Assistants and Nurse Practitioners) who all work together to provide you with the care you need, when you need it.  Your next appointment:   3 month(s)  Provider:   You may see Lonni Hanson, MD or Bernardino Bring, PA-C

## 2024-03-11 ENCOUNTER — Ambulatory Visit: Payer: Self-pay | Admitting: Physician Assistant

## 2024-03-11 LAB — BASIC METABOLIC PANEL WITH GFR
BUN/Creatinine Ratio: 14 (ref 10–24)
BUN: 18 mg/dL (ref 8–27)
CO2: 23 mmol/L (ref 20–29)
Calcium: 9.1 mg/dL (ref 8.6–10.2)
Chloride: 99 mmol/L (ref 96–106)
Creatinine, Ser: 1.3 mg/dL — ABNORMAL HIGH (ref 0.76–1.27)
Glucose: 76 mg/dL (ref 70–99)
Potassium: 4.4 mmol/L (ref 3.5–5.2)
Sodium: 135 mmol/L (ref 134–144)
eGFR: 56 mL/min/1.73 — ABNORMAL LOW (ref >59)

## 2024-03-12 DIAGNOSIS — R2681 Unsteadiness on feet: Secondary | ICD-10-CM | POA: Diagnosis not present

## 2024-03-12 DIAGNOSIS — R2689 Other abnormalities of gait and mobility: Secondary | ICD-10-CM | POA: Diagnosis not present

## 2024-03-12 DIAGNOSIS — G20C Parkinsonism, unspecified: Secondary | ICD-10-CM | POA: Diagnosis not present

## 2024-03-14 DIAGNOSIS — G20C Parkinsonism, unspecified: Secondary | ICD-10-CM | POA: Diagnosis not present

## 2024-03-14 DIAGNOSIS — R2689 Other abnormalities of gait and mobility: Secondary | ICD-10-CM | POA: Diagnosis not present

## 2024-03-14 DIAGNOSIS — R2681 Unsteadiness on feet: Secondary | ICD-10-CM | POA: Diagnosis not present

## 2024-03-17 DIAGNOSIS — R2689 Other abnormalities of gait and mobility: Secondary | ICD-10-CM | POA: Diagnosis not present

## 2024-03-17 DIAGNOSIS — G20C Parkinsonism, unspecified: Secondary | ICD-10-CM | POA: Diagnosis not present

## 2024-03-17 DIAGNOSIS — R2681 Unsteadiness on feet: Secondary | ICD-10-CM | POA: Diagnosis not present

## 2024-03-20 DIAGNOSIS — R2681 Unsteadiness on feet: Secondary | ICD-10-CM | POA: Diagnosis not present

## 2024-03-20 DIAGNOSIS — R2689 Other abnormalities of gait and mobility: Secondary | ICD-10-CM | POA: Diagnosis not present

## 2024-03-20 DIAGNOSIS — G20C Parkinsonism, unspecified: Secondary | ICD-10-CM | POA: Diagnosis not present

## 2024-03-24 DIAGNOSIS — R2681 Unsteadiness on feet: Secondary | ICD-10-CM | POA: Diagnosis not present

## 2024-03-24 DIAGNOSIS — G20C Parkinsonism, unspecified: Secondary | ICD-10-CM | POA: Diagnosis not present

## 2024-03-24 DIAGNOSIS — D472 Monoclonal gammopathy: Secondary | ICD-10-CM | POA: Diagnosis not present

## 2024-03-24 DIAGNOSIS — N1831 Chronic kidney disease, stage 3a: Secondary | ICD-10-CM | POA: Diagnosis not present

## 2024-03-24 DIAGNOSIS — E871 Hypo-osmolality and hyponatremia: Secondary | ICD-10-CM | POA: Diagnosis not present

## 2024-03-24 DIAGNOSIS — R2689 Other abnormalities of gait and mobility: Secondary | ICD-10-CM | POA: Diagnosis not present

## 2024-03-27 DIAGNOSIS — R2681 Unsteadiness on feet: Secondary | ICD-10-CM | POA: Diagnosis not present

## 2024-03-27 DIAGNOSIS — G20C Parkinsonism, unspecified: Secondary | ICD-10-CM | POA: Diagnosis not present

## 2024-03-27 DIAGNOSIS — R2689 Other abnormalities of gait and mobility: Secondary | ICD-10-CM | POA: Diagnosis not present

## 2024-03-31 DIAGNOSIS — R2689 Other abnormalities of gait and mobility: Secondary | ICD-10-CM | POA: Diagnosis not present

## 2024-03-31 DIAGNOSIS — G20C Parkinsonism, unspecified: Secondary | ICD-10-CM | POA: Diagnosis not present

## 2024-03-31 DIAGNOSIS — R2681 Unsteadiness on feet: Secondary | ICD-10-CM | POA: Diagnosis not present

## 2024-04-03 DIAGNOSIS — R2689 Other abnormalities of gait and mobility: Secondary | ICD-10-CM | POA: Diagnosis not present

## 2024-04-03 DIAGNOSIS — R2681 Unsteadiness on feet: Secondary | ICD-10-CM | POA: Diagnosis not present

## 2024-04-03 DIAGNOSIS — G20C Parkinsonism, unspecified: Secondary | ICD-10-CM | POA: Diagnosis not present

## 2024-04-14 DIAGNOSIS — R2689 Other abnormalities of gait and mobility: Secondary | ICD-10-CM | POA: Diagnosis not present

## 2024-04-14 DIAGNOSIS — G20C Parkinsonism, unspecified: Secondary | ICD-10-CM | POA: Diagnosis not present

## 2024-04-14 DIAGNOSIS — R2681 Unsteadiness on feet: Secondary | ICD-10-CM | POA: Diagnosis not present

## 2024-04-17 DIAGNOSIS — R2689 Other abnormalities of gait and mobility: Secondary | ICD-10-CM | POA: Diagnosis not present

## 2024-04-17 DIAGNOSIS — G20C Parkinsonism, unspecified: Secondary | ICD-10-CM | POA: Diagnosis not present

## 2024-04-17 DIAGNOSIS — R2681 Unsteadiness on feet: Secondary | ICD-10-CM | POA: Diagnosis not present

## 2024-04-21 DIAGNOSIS — R2689 Other abnormalities of gait and mobility: Secondary | ICD-10-CM | POA: Diagnosis not present

## 2024-04-21 DIAGNOSIS — G20C Parkinsonism, unspecified: Secondary | ICD-10-CM | POA: Diagnosis not present

## 2024-04-21 DIAGNOSIS — R2681 Unsteadiness on feet: Secondary | ICD-10-CM | POA: Diagnosis not present

## 2024-04-23 DIAGNOSIS — R2681 Unsteadiness on feet: Secondary | ICD-10-CM | POA: Diagnosis not present

## 2024-04-23 DIAGNOSIS — G20C Parkinsonism, unspecified: Secondary | ICD-10-CM | POA: Diagnosis not present

## 2024-04-23 DIAGNOSIS — R2689 Other abnormalities of gait and mobility: Secondary | ICD-10-CM | POA: Diagnosis not present

## 2024-06-12 NOTE — Progress Notes (Signed)
 "  Cardiology Office Note    Date:  06/13/2024   ID:  Warren, Steve 07-03-1945, MRN [id number]  PCP:  Jeffie Cheryl BRAVO, MD  Cardiologist:  Lonni Hanson, MD  Electrophysiologist:  None   Chief Complaint: Follow up  History of Present Illness:   Steve Warren is a 79 y.o. male with history of CAD status post PCI to the RCA on 01/04/2021, aortic atherosclerosis, pSVT, CKD stage IIIa, MGUS, Parkinson's disease, chronic dizziness, HLD, BPH, and GERD who presents for follow up of follow-up of CAD.   He was admitted to [facility] in 11/2020 with a 4-day history of intermittent chest discomfort described as a sharp sensation in the center of his chest that radiated to his back and seemed to be associated primarily with activity, though also wondered if eating precipitated it.  There was some shortness of breath when the pain occurred.  High-sensitivity troponin peaked at 86.  BNP 112.  EKG demonstrated sinus rhythm with no acute ischemic ST-T changes. Chest x-ray showed low lung volumes without acute abnormality.  CTA chest/abdomen/pelvis showed no acute vascular abnormality with aortic atherosclerosis as well as at least two-vessel coronary artery calcifications, stable pulmonary nodules, colonic diverticulosis without acute diverticulitis, and severe degenerative changes within the lower lumbar spine noted.  Echo showed an EF of 45 to 50%, mild LVH, dyskinesis of the left ventricular, basal mid inferolateral wall, and inferior wall, grade 1 diastolic dysfunction, normal RV systolic function and ventricular cavity size, and trivial mitral regurgitation.  Lexiscan  MPI showed no evidence of significant ischemia and was overall low risk.   He was seen in follow-up later in 11/2020 and continued to note randomly occurring intermittent episodes of chest discomfort that were not as severe as what he experienced leading to his hospital admission.  It was noted he was experiencing episodes of hypotension  following transition from Toprol  to carvedilol  by outside office.  In this setting, he was transitioned back to Toprol .  Given his stable to improving symptoms, continued medical therapy was recommended.  However, we received a message later that month that he was experiencing worsening exertional dyspnea and angina that improved with rest.  In this setting, he underwent diagnostic R/LHC on 01/04/2021 which demonstrated chronic total/subtotal occlusion of the mid RCA as well as 30% proximal and mid LAD stenoses.  He underwent successful PCI to the mid RCA.  Normal LVEDP.   He was seen in hospital follow-up in 01/2021 and was experiencing some dizziness.  Upon reviewing his medications, he was taking a full 25 mg of Toprol -XL and had associated hypotension.  Following decrease of Toprol  to the previously recommended 12.5 mg his BP had improved with resolution of dizziness.  Following PCI, he noted resolution of exertional chest discomfort and dyspnea.  He was seen in the office on 04/13/2021 and reported some episodes of hypotension with home BP cuff readings with intermittent positional dizziness.  He also noted several episodes of palpitations, though did not recall if these episodes occurred during episodes of dizziness.  Given episodes of hypotension, metoprolol  was discontinued.  Subsequent Zio patch demonstrated a predominant rhythm of sinus with an average heart rate of 68 bpm (range 47 to 174 bpm), 17 episodes of SVT with the fastest interval lasting 5 beats with a maximal rate of 174 bpm and the longest interval lasting 12 beats with an average rate of 107 bpm, occasional PACs, rare atrial couplets, atrial triplets, PVCs, ventricular couplets, and ventricular triplets.  He  was seen in the office in 05/2021 and was without further palpitations with noted improvement in his dizziness.  He was seen in the office in 06/2022 and continued to report bradycardic heart rates as well as split-second lasting sharp  right-sided chest discomfort that did not feel like his prior angina.  He continued to report fluctuations in BP ranging from 90s to 150s systolic.  He had not needed any as needed metoprolol .  He continued to note some dizziness without frank syncope.  With increased stress, he noted worsening of his tremor.  24-hour ambulatory BP monitor showed an overall average blood pressure 116/66 (with a range of 80-146/43-80) with a mean heart rate of 74 bpm.  Average awake BP was 114/65 with a mean heart rate of 76 bpm.  Average asleep blood pressure was 121/68 with a mean heart rate of 66 bpm.  He was seen in the office in 09/2022 continuing to note some improving exertional fatigue that he felt was similar to what he experienced leading up to his PCI in 2022.  Dizziness was stable.  Subsequent Lexiscan  MPI 09/2022 showed no evidence of ischemia or infarction LVEF of 68%.  CT attenuation corrected images showed mild coronary artery calcification and aortic atherosclerosis.  Overall, this was a low risk study.  He was seen in the office in 02/2023 noting stable intermittent dizziness and fatigue with continued labile blood pressure readings.  Clopidogrel  was discontinued with recommendation to be maintained on aspirin  indefinitely.  He was seen in the office in 08/2023 noting chronic intermittent dizziness that was randomly occurring and more noticeable when gazing upwards as well as intermittent low blood pressures, particularly in the mornings with readings at time in the 70s mmHg, improved with hydration.   Liberalizing of salt was recommended.  He was last seen in the office in 02/2024 noting stable intermittent dizziness, typically first thing in the morning.  He was started on midodrine  5 mg daily as needed for systolic blood pressure less than 100 mmHg vies to wear compression socks.  He was seen in the [facility] ER on 05/25/2024 after sustaining a possible fall.  CT head showed no acute intracranial abnormality  with findings suggestive of bilateral nasal bone fracture of uncertain acuity.  He comes in accompanied by his wife today and is without symptoms of angina or cardiac decompensation.  Patient's wife reports the above fall was not witnessed.  They were leaving a restaurant and the patient was walking through the doorway.  He reported feeling numbness in his feet and fell to the ground.  He close without symptoms of angina or palpitations leading up to this.  No postictal timeframe.  He continues to note numbness along his bilateral lower extremities.  No falls since.  Has taken as needed midodrine  4 times since his last visit for blood pressure in the 90s mmHg systolic.  3 of these times it was a half a tablet.   Labs independently reviewed: 03/2024 - BUN 16, serum creatinine 1.18, potassium 4.5, albumin 4.1, Hgb 14.5, PLT 179 11/2023 - AST/ALT normal, TC 113, TG 84, HDL 41, LDL 55 05/2023 - TSH normal  Past Medical History:  Diagnosis Date   Anxiety    BPH (benign prostatic hyperplasia)    CAD in native artery    Cataract    bilateral- Dec 2021 and jan 2022   GERD (gastroesophageal reflux disease)    Hypercholesterolemia    Hyperlipidemia    Hypertension     Past  Surgical History:  Procedure Laterality Date   APPENDECTOMY     CATARACT EXTRACTION, BILATERAL     COLONOSCOPY     CORONARY STENT INTERVENTION N/A 01/04/2021   Procedure: CORONARY STENT INTERVENTION;  Surgeon: Mady Bruckner, MD;  Location: [facility] INVASIVE CV LAB;  Service: Cardiovascular;  Laterality: N/A;   RIGHT/LEFT HEART CATH AND CORONARY ANGIOGRAPHY Bilateral 01/04/2021   Procedure: RIGHT/LEFT HEART CATH AND CORONARY ANGIOGRAPHY;  Surgeon: Mady Bruckner, MD;  Location: [facility] INVASIVE CV LAB;  Service: Cardiovascular;  Laterality: Bilateral;   TONSILLECTOMY      Current Medications: Active Medications[1]  Allergies:   Patient has no known allergies.   Social History   Socioeconomic History   Marital status:  Married    Spouse name: Not on file   Number of children: Not on file   Years of education: Not on file   Highest education level: Not on file  Occupational History   Not on file  Tobacco Use   Smoking status: Former    Current packs/day: 0.00    Types: Cigarettes    Quit date: 2012    Years since quitting: 14.0   Smokeless tobacco: Never  Vaping Use   Vaping status: Never Used  Substance and Sexual Activity   Alcohol use: Yes    Comment: once a month liquor   Drug use: Never   Sexual activity: Not Currently  Other Topics Concern   Not on file  Social History Narrative   Not on file   Social Drivers of Health   Tobacco Use: Medium Risk (06/13/2024)   Patient History    Smoking Tobacco Use: Former    Smokeless Tobacco Use: Never    Passive Exposure: Not on Actuary Strain: Low Risk  (01/01/2024)   Received from [facility]   Overall Financial Resource Strain (CARDIA)    Difficulty of Paying Living Expenses: Not hard at all  Food Insecurity: No Food Insecurity (01/01/2024)   Received from [facility]   Epic    Within the past 12 months, you worried that your food would run out before you got the money to buy more.: Never true    Within the past 12 months, the food you bought just didn't last and you didn't have money to get more.: Never true  Transportation Needs: No Transportation Needs (01/01/2024)   Received from [facility] - Transportation    In the past 12 months, has lack of transportation kept you from medical appointments or from getting medications?: No    Lack of Transportation (Non-Medical): No  Physical Activity: Not on file  Stress: Not on file  Social Connections: Not on file  Depression (EYV7-0): Not on file  Alcohol Screen: Not on file  Housing: Low Risk  (05/25/2024)   Received from [facility]   Epic    In the last 12 months, was there a time when you  were not able to pay the mortgage or rent on time?: No    In the past 12 months, how many times have you moved where you were living?: 0    At any time in the past 12 months, were you homeless or living in a shelter (including now)?: No  Utilities: Not At Risk (01/01/2024)   Received from [facility]   Epic    In the past 12 months has the electric, gas, oil, or water company threatened to shut  off services in your home?: No  Health Literacy: Not on file     Family History:  The patient's family history includes Alzheimer's disease in his father; Diabetes in his maternal grandmother.  ROS:   12-point review of systems is negative unless otherwise noted in the HPI.   EKGs/Labs/Other Studies Reviewed:    Studies reviewed were summarized above. The additional studies were reviewed today:  Carotid artery ultrasound 10/10/2023: Summary:  Right Carotid: There was no evidence of thrombus, dissection,  atherosclerotic plaque or stenosis in the cervical carotid system.   Left Carotid: The extracranial vessels were near-normal with only minimal  wall thickening or plaque.   Vertebrals:  Bilateral vertebral arteries demonstrate antegrade flow.  Subclavians: Normal flow hemodynamics were seen in bilateral subclavian arteries.  __________   Lexiscan  MPI 09/25/2022:   The study is normal. The study is low risk.   No ST deviation was noted.   LV perfusion is normal. There is no evidence of ischemia. There is no evidence of infarction.   Left ventricular function is normal. Nuclear stress EF: 68 %. End diastolic cavity size is normal. End systolic cavity size is normal.   CT attenuation images showed mild aortic and coronary calcifications. __________   24-hour ambulatory BP monitor 08/2022:     Blood pressure was monitored for 24 hours.   Overall average blood pressure was 116/66 (range 80-146/43-80) with mean heart rate of 74 bpm.   Average awake blood pressure was 114/65  with mean heart rate of 76 bpm.   Average asleep blood pressure was 121/68 with mean heart rate 66 bpm.  Asleep BP dip was -6.4% systolic and -4.8% diastolic. __________   Zio patch 03/2021: Patient had a min HR of 47 bpm, max HR of 174 bpm, and avg HR of 68 bpm. Predominant underlying rhythm was Sinus Rhythm. 17 Supraventricular Tachycardia runs occurred, the run with the fastest interval lasting 5 beats with a max rate of 174 bpm, the longest lasting 12 beats with an avg rate of 107 bpm. Isolated SVEs were occasional (1.2%, 15834), SVE Couplets were rare (<1.0%, 93), and SVE Triplets were rare (<1.0%, 22). Isolated VEs were rare (<1.0%, 10769), VE Couplets were rare (<1.0%, 11), and VE Triplets were rare (<1.0%, 1). Ventricular Bigeminy and Trigeminy were present.  __________   [facility] 12/2020: Conclusion Coronary artery disease including chronic total/subtotal occlusion of the mid RCA, and 30% proximal and mid LAD. Normal left ventricular end-diastolic pressure of 15 mmHg RA 8, RV 25/11, PCP WP 11, PA 29/10 (19) CO 6.77, CI 3.15 Successful PCI to the mid RCA with placement of a 3.5 x 22 mm Onyx DES with excellent angiographic result and TIMI-3 flow   Recommendation Dual antiplatelet therapy with aspirin  and Plavix  for 6 months, ideally longer Ongoing aggressive secondary prevention Admit overnight for monitoring.  Discharge in the morning. __________   2D echo 11/16/2020: 1. Left ventricular ejection fraction, by estimation, is 45 to 50%. The  left ventricle has mildly decreased function. The left ventricle  demonstrates regional wall motion abnormalities (see scoring  diagram/findings for description). There is mild left  ventricular hypertrophy. Left ventricular diastolic parameters are  consistent with Grade I diastolic dysfunction (impaired relaxation). There  is dyskinesis of the left ventricular, basal-mid inferolateral wall and  inferior wall.   2. Right ventricular systolic  function is normal. The right ventricular  size is normal.   3. The mitral valve was not well visualized. Trivial mitral valve  regurgitation.  4. The aortic valve was not well visualized. Aortic valve regurgitation  is not visualized. No aortic stenosis is present. __________   Lexiscan  MPI 11/17/2020: The study is normal. This is a low risk study. The left ventricular ejection fraction is hyperdynamic (>65%). There is no evidence for ischemia   EKG:  EKG is ordered today.  The EKG ordered today demonstrates NSR, 69 bpm, no acute st/t changes  Recent Labs: 07/02/2023: ALT 17; Hemoglobin 14.6; Platelet Count 176 03/10/2024: BUN 18; Creatinine, Ser 1.30; Potassium 4.4; Sodium 135  Recent Lipid Panel    Component Value Date/Time   CHOL 116 01/27/2021 0927   TRIG 99 01/27/2021 0927   HDL 36 (L) 01/27/2021 0927   CHOLHDL 3.2 01/27/2021 0927   VLDL 20 01/27/2021 0927   LDLCALC 60 01/27/2021 0927    PHYSICAL EXAM:    VS:  BP 128/70 (BP Location: Left Arm, Patient Position: Sitting, Cuff Size: Normal)   Pulse 69   Ht 5' 11 (1.803 m)   Wt 222 lb 9.6 oz (101 kg)   SpO2 98%   BMI 31.05 kg/m   BMI: Body mass index is 31.05 kg/m.  Physical Exam Vitals reviewed.  Constitutional:      Appearance: He is well-developed.  HENT:     Head: Normocephalic and atraumatic.  Eyes:     General:        Right eye: No discharge.        Left eye: No discharge.  Cardiovascular:     Rate and Rhythm: Normal rate and regular rhythm.     Pulses:          Dorsalis pedis pulses are 1+ on the right side and 2+ on the left side.       Posterior tibial pulses are 1+ on the right side and 2+ on the left side.     Heart sounds: Normal heart sounds, S1 normal and S2 normal. Heart sounds not distant. No midsystolic click and no opening snap. No murmur heard.    No friction rub.  Pulmonary:     Effort: Pulmonary effort is normal. No respiratory distress.     Breath sounds: Normal breath sounds. No  decreased breath sounds, wheezing, rhonchi or rales.  Musculoskeletal:     Cervical back: Normal range of motion.     Comments: Trivial bilateral pretibial edema.   Skin:    General: Skin is warm and dry.     Nails: There is no clubbing.  Neurological:     Mental Status: He is alert and oriented to person, place, and time.     Motor: Tremor present.  Psychiatric:        Speech: Speech normal.        Behavior: Behavior normal.        Thought Content: Thought content normal.        Judgment: Judgment normal.     Wt Readings from Last 3 Encounters:  06/13/24 222 lb 9.6 oz (101 kg)  03/10/24 215 lb 12.8 oz (97.9 kg)  12/20/23 205 lb (93 kg)     ASSESSMENT & PLAN:   CAD involving the native coronary arteries without angina: He is doing well and without symptoms concerning for angina or cardiac decompensation.  Continue aggressive risk factor modification and secondary prevention including aspirin  81 mg and atorvastatin  40 mg.  No indication for further ischemic testing at this time.  Labile hypertension with chronic dizziness: Blood pressure is well-controlled in the office today.  Has  taken as needed midodrine  4 times since his visit in 02/2024 3 of these times he took 2.5 mg.  Continue to allow for permissive blood pressure.  Labile BP readings are likely to progress with underlying Parkinson's disease.  Recommend compression socks.    PSVT: Quiescent.  No longer on AV nodal blocking pharmacotherapy given labile hypertension.  HLD: LDL 55 in 11/2023.  Remains on atorvastatin  40 mg.  Parkinson's disease with fall: Episode earlier this month with fall, though cannot exclude syncopal episode as this was unwitnessed.  Reports paresthesias along bilateral feet which may have contributed.  We will obtain an echo to ensure no new structural abnormality as well as a Zio patch to evaluate for significant arrhythmia, symptomatic bradycardia, or prolonged pauses.  Obtain ABI.  If this workup is  reassuring, do not anticipate further cardiac testing.    Disposition: F/u with Dr. Mady or an APP in 2 months.   Medication Adjustments/Labs and Tests Ordered: Current medicines are reviewed at length with the patient today.  Concerns regarding medicines are outlined above. Medication changes, Labs and Tests ordered today are summarized above and listed in the Patient Instructions accessible in Encounters.   Signed, Bernardino Bring, PA-C 06/13/2024 12:39 PM     [facility] [place] [address] [place], [place] [address] [phone]     [1]  Current Meds  Medication Sig   aspirin  EC 81 MG EC tablet Take 1 tablet (81 mg total) by mouth daily. Swallow whole.   atorvastatin  (LIPITOR) 40 MG tablet TAKE 1 TABLET(40 MG) BY MOUTH DAILY   B Complex-C (B-COMPLEX WITH VITAMIN C) tablet Take 1 tablet by mouth every morning.   Bacillus Coagulans-Inulin (PROBIOTIC) 1-250 BILLION-MG CAPS Take 1 capsule by mouth daily.   calcium  carbonate (TUMS EX) 750 MG chewable tablet Chew by mouth.   carbidopa-levodopa (SINEMET CR) 50-200 MG tablet Take 1 tablet by mouth at bedtime.   carbidopa-levodopa (SINEMET IR) 25-100 MG tablet Take 1 tablet by mouth 3 (three) times daily.   Cholecalciferol (VITAMIN D-1000 MAX ST) 25 MCG (1000 UT) tablet Take 1 tablet by mouth daily.   Cyanocobalamin  (VITAMIN B 12 PO) Take 1,000 mcg by mouth daily at 12 noon.   midodrine  (PROAMATINE ) 5 MG tablet Take 1 tablet (5 mg total) by mouth daily as needed (for systolic (top number) Blood Pressure less than 100 mmHg).   Polyethyl Glycol-Propyl Glycol (SYSTANE) 0.4-0.3 % GEL ophthalmic gel Place 1 Application into both eyes at bedtime.   primidone  (MYSOLINE ) 50 MG tablet Take 50 mg by mouth at bedtime.   Propylene Glycol (SYSTANE COMPLETE OP) Place 1 drop into both eyes in the morning, at noon, and at bedtime.   RESTASIS 0.05 % ophthalmic emulsion 1 drop 2 (two) times daily.   sertraline (ZOLOFT) 25 MG  tablet Take 25 mg by mouth daily.   tamsulosin  (FLOMAX ) 0.4 MG CAPS capsule Take 0.4 mg by mouth daily after supper.   Trospium  Chloride 60 MG CP24 Take 1 capsule (60 mg total) by mouth daily.   "

## 2024-06-13 ENCOUNTER — Encounter: Payer: Self-pay | Admitting: Physician Assistant

## 2024-06-13 ENCOUNTER — Ambulatory Visit: Attending: Physician Assistant | Admitting: Physician Assistant

## 2024-06-13 ENCOUNTER — Ambulatory Visit

## 2024-06-13 VITALS — BP 128/70 | HR 69 | Ht 71.0 in | Wt 222.6 lb

## 2024-06-13 DIAGNOSIS — G20A1 Parkinson's disease without dyskinesia, without mention of fluctuations: Secondary | ICD-10-CM | POA: Diagnosis not present

## 2024-06-13 DIAGNOSIS — R42 Dizziness and giddiness: Secondary | ICD-10-CM

## 2024-06-13 DIAGNOSIS — E785 Hyperlipidemia, unspecified: Secondary | ICD-10-CM

## 2024-06-13 DIAGNOSIS — I2510 Atherosclerotic heart disease of native coronary artery without angina pectoris: Secondary | ICD-10-CM | POA: Diagnosis not present

## 2024-06-13 DIAGNOSIS — R0989 Other specified symptoms and signs involving the circulatory and respiratory systems: Secondary | ICD-10-CM

## 2024-06-13 DIAGNOSIS — I471 Supraventricular tachycardia, unspecified: Secondary | ICD-10-CM

## 2024-06-13 NOTE — Patient Instructions (Addendum)
 Medication Instructions:  Your physician recommends that you continue on your current medications as directed. Please refer to the Current Medication list given to you today.   *If you need a refill on your cardiac medications before your next appointment, please call your pharmacy*  Lab Work: None ordered at this time  If you have labs (blood work) drawn today and your tests are completely normal, you will receive your results only by: MyChart Message (if you have MyChart) OR A paper copy in the mail If you have any lab test that is abnormal or we need to change your treatment, we will call you to review the results.  Testing/Procedures: Your physician has requested that you have an echocardiogram. Echocardiography is a painless test that uses sound waves to create images of your heart. It provides your doctor with information about the size and shape of your heart and how well your hearts chambers and valves are working.   You may receive an ultrasound enhancing agent through an IV if needed to better visualize your heart during the echo. This procedure takes approximately one hour.  There are no restrictions for this procedure.  This will take place at [address] [facility] [address]) [address], [place] [address]  Please note: We ask at that you not bring children with you during ultrasound (echo/ vascular) testing. Due to room size and safety concerns, children are not allowed in the ultrasound rooms during exams. Our front office staff cannot provide observation of children in our lobby area while testing is being conducted. An adult accompanying a patient to their appointment will only be allowed in the ultrasound room at the discretion of the ultrasound technician under special circumstances. We apologize for any inconvenience. ZIO XT- Long Term Monitor Instructions  Your physician has requested you wear a ZIO patch monitor for 14 day.  This is a single patch monitor. [employer]  supplies one patch monitor per enrollment. Additional stickers are not available. Please do not apply patch if you will be having a Nuclear Stress Test, Echocardiogram, Cardiac CT, MRI, or Chest Xray during the period you would be wearing the monitor. The patch cannot be worn during these tests. You cannot remove and re-apply the ZIO XT patch monitor.  Your ZIO patch monitor will be mailed 3 day USPS to your address on file. It may take 3-5 days to receive your monitor after you have been enrolled. Once you have received your monitor, please review the enclosed instructions. Your monitor has already been registered assigning a specific monitor serial number to you.  Billing and Patient Assistance Program Information  We have supplied [employer] with any of your insurance information on file for billing purposes.  [employer] offers a sliding scale Patient Assistance Program for patients that do not have insurance, or whose insurance does not completely cover the cost of the ZIO monitor.  You must apply for the Patient Assistance Program to qualify for this discounted rate.  To apply, please call [employer] at [phone], select option 4, select option 2, ask to apply for Patient Assistance Program. M[place]eth will ask your household income, and how many people are in your household. They will quote your out-of-pocket cost based on that information. [employer] will also be able to set up a 50-month, interest-free payment plan if needed.  Applying the monitor   Shave hair from upper left chest.  Hold abrader disc by orange tab. Rub abrader in 40 strokes over the upper left chest as indicated in  your monitor instructions.  Clean area with 4 enclosed alcohol pads. Let dry.  Apply patch as indicated in monitor instructions. Patch will be placed under collarbone on left side of chest with arrow pointing upward.  Rub patch adhesive wings for 2 minutes. Remove white label marked 1. Remove the white label marked 2. Rub  patch adhesive wings for 2 additional minutes.  While looking in a mirror, press and release button in center of patch. A small green light will flash 3-4 times. This will be your only indicator that the monitor has been turned on.  Do not shower for the first 24 hours. You may shower after the first 24 hours.  Press the button if you feel a symptom. You will hear a small click. Record Date, Time and Symptom in the Patient Logbook.  When you are ready to remove the patch, follow instructions on the last 2 pages of Patient Logbook.  Stick patch monitor into the tabs at the bottom of the return box.  Place Patient Logbook in the blue and white box. Use locking tab on box and tape box closed securely. The blue and white box has prepaid postage on it. Please place it in the mailbox as soon as possible. Your physician should have your test results approximately 7-14 days after the monitor has been mailed back to [facility].  Call [facility] Customer Care at [phone] if you have questions regarding your ZIO XT patch monitor.  Call them immediately if you see an orange light blinking on your monitor.  If your monitor falls off in less than 4 days, contact our Monitor department at [phone].  If your monitor becomes loose or falls off after 4 days call [employer] at [phone] for suggestions on securing your monitor.   Your physician has requested that you have an ankle brachial index (ABI). During this test an ultrasound and blood pressure cuff are used to evaluate the arteries that supply the arms and legs with blood.  Allow thirty minutes for this exam.  There are no restrictions or special instructions.  This will take place at [address] [facility] [address] [facility]) [address], [place] [address]  Please note: We ask at that you not bring children with you during ultrasound (echo/ vascular) testing. Due to room size and safety concerns, children are not allowed in the ultrasound rooms  during exams. Our front office staff cannot provide observation of children in our lobby area while testing is being conducted. An adult accompanying a patient to their appointment will only be allowed in the ultrasound room at the discretion of the ultrasound technician under special circumstances. We apologize for any inconvenience.  Follow-Up: At [facility], you and your health needs are our priority.  As part of our continuing mission to provide you with exceptional heart care, our providers are all part of one team.  This team includes your primary Cardiologist (physician) and Advanced Practice Providers or APPs (Physician Assistants and Nurse Practitioners) who all work together to provide you with the care you need, when you need it.  Your next appointment:   2 month(s)  Provider:   You may see Lonni Hanson, MD or one of the following Advanced Practice Providers on your designated Care Team:   Lonni Meager, NP Lesley Maffucci, PA-C Bernardino Bring, PA-C

## 2024-06-30 ENCOUNTER — Other Ambulatory Visit

## 2024-07-01 ENCOUNTER — Ambulatory Visit

## 2024-07-07 ENCOUNTER — Other Ambulatory Visit: Payer: Medicare PPO

## 2024-07-14 ENCOUNTER — Ambulatory Visit: Admitting: Oncology

## 2024-07-21 ENCOUNTER — Ambulatory Visit: Payer: Medicare PPO | Admitting: Oncology

## 2024-08-04 ENCOUNTER — Ambulatory Visit: Admitting: Urology

## 2024-08-20 ENCOUNTER — Ambulatory Visit: Admitting: Physician Assistant
